# Patient Record
Sex: Male | Born: 1940 | Race: Black or African American | Hispanic: No | Smoking: Never smoker
Health system: Southern US, Community
[De-identification: ages and names within clinical notes are randomized; demographics above are authoritative.]

## PROBLEM LIST (undated history)

## (undated) DIAGNOSIS — E119 Type 2 diabetes mellitus without complications: Secondary | ICD-10-CM

## (undated) DIAGNOSIS — I4891 Unspecified atrial fibrillation: Secondary | ICD-10-CM

## (undated) DIAGNOSIS — I10 Essential (primary) hypertension: Secondary | ICD-10-CM

## (undated) DIAGNOSIS — I219 Acute myocardial infarction, unspecified: Secondary | ICD-10-CM

## (undated) HISTORY — PX: HERNIA REPAIR: SHX51

---

## 2009-04-14 ENCOUNTER — Emergency Department (HOSPITAL_COMMUNITY): Admission: EM | Admit: 2009-04-14 | Discharge: 2009-04-14 | Payer: Self-pay | Admitting: Emergency Medicine

## 2010-08-29 IMAGING — CR DG CHEST 2V
2 series · 2 of 2 positions shown · non-contrast
Comparison: None.

CLINICAL DATA: Weakness.  Cough.  Headache.

CHEST - 2 VIEW 04/14/2009:

[w chest pa]
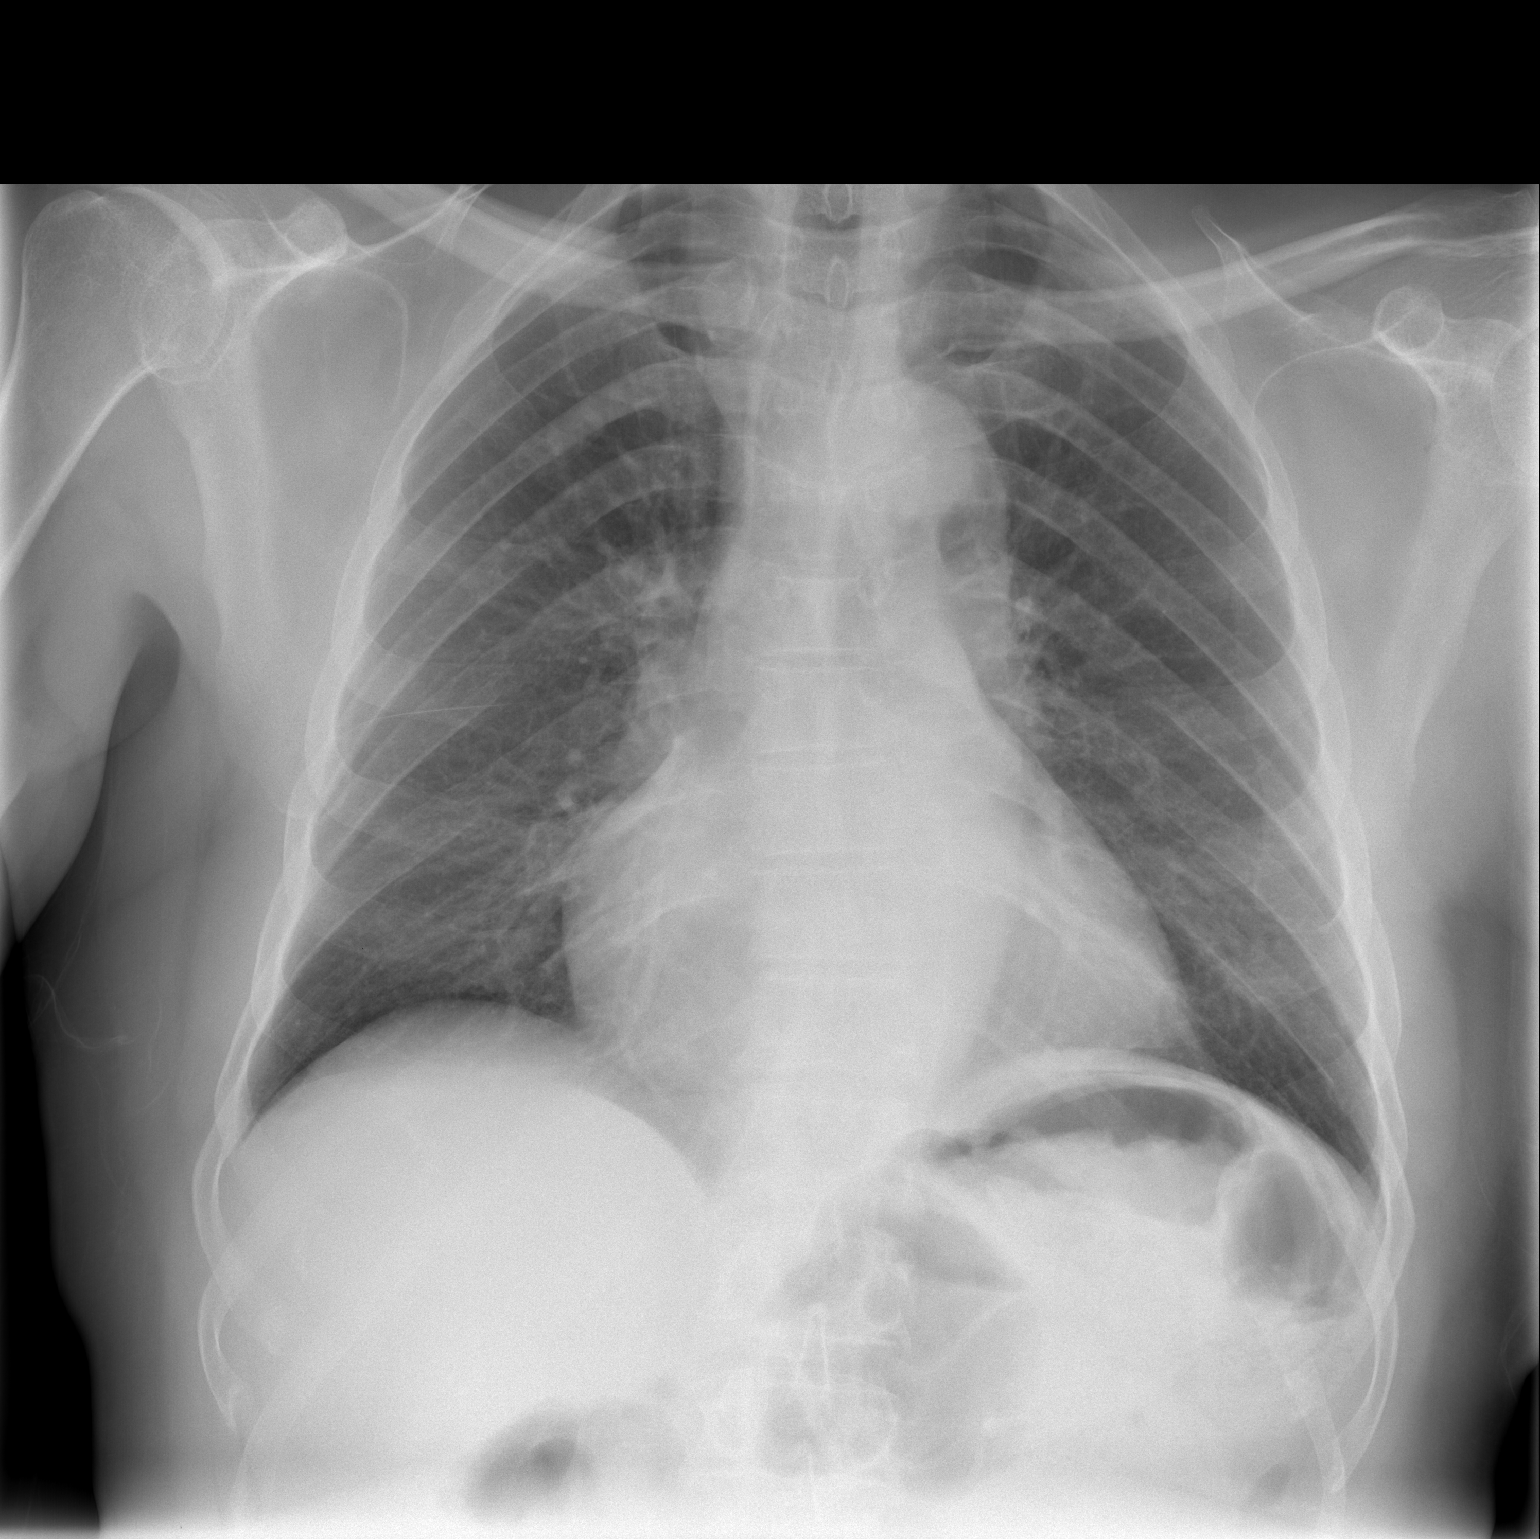

[w chest lat]
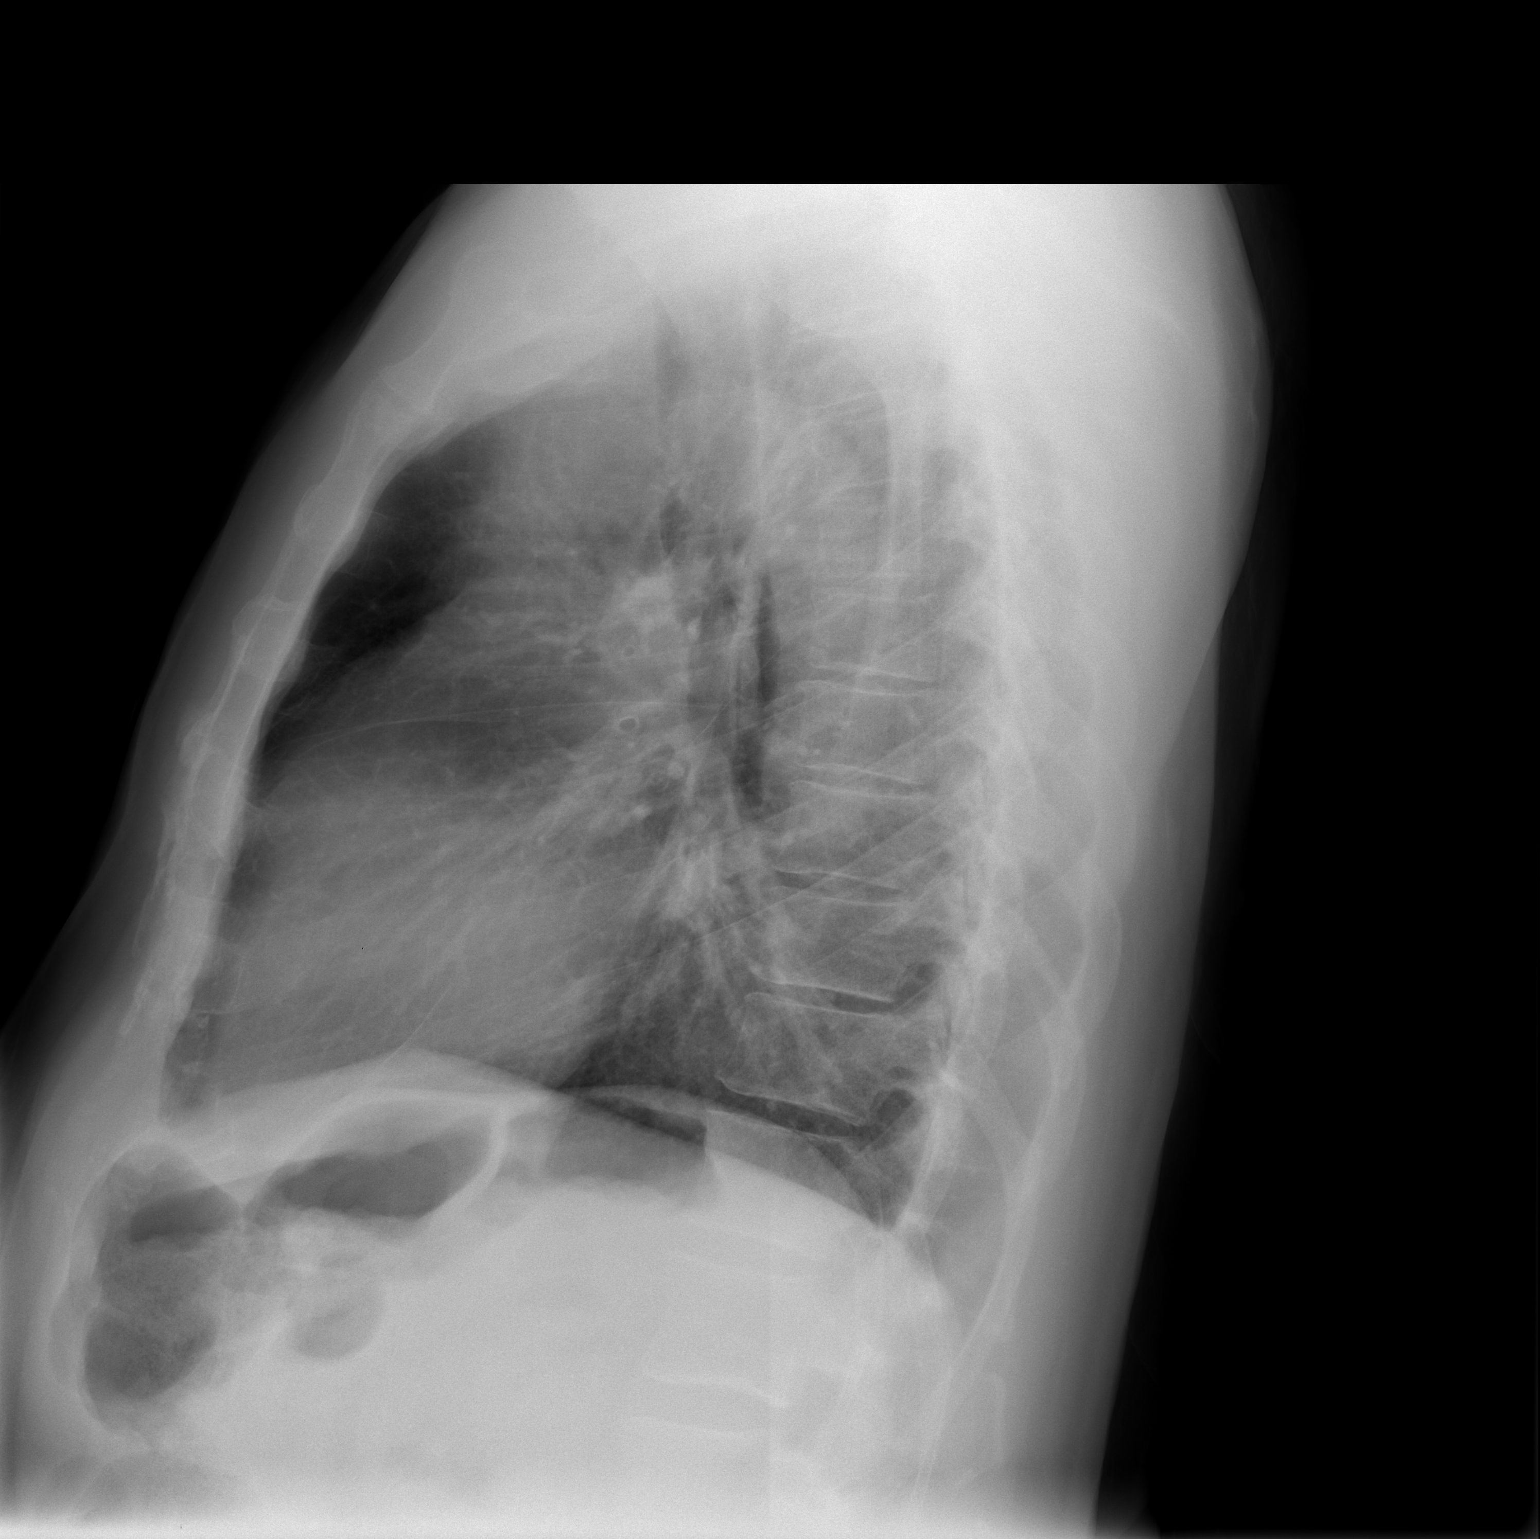

[2 of 2 positions shown; findings below may reference images not displayed]

FINDINGS: Heart mildly enlarged.  Thoracic aorta tortuous.  Hilar
and mediastinal contours otherwise unremarkable.  Lungs clear.
Bronchovascular markings normal.  No pleural effusions.  Visualized
bony thorax intact.
IMPRESSION: Mild cardiomegaly.  No acute cardiopulmonary disease.

## 2018-11-27 DEATH — deceased

## 2019-07-17 ENCOUNTER — Encounter (HOSPITAL_COMMUNITY): Payer: Self-pay | Admitting: Emergency Medicine

## 2019-07-17 ENCOUNTER — Emergency Department (HOSPITAL_COMMUNITY)
Admission: EM | Admit: 2019-07-17 | Discharge: 2019-07-17 | Disposition: A | Discharge status: Home / Self Care | Payer: Medicare Other | Attending: Emergency Medicine | Admitting: Emergency Medicine

## 2019-07-17 ENCOUNTER — Other Ambulatory Visit: Payer: Self-pay

## 2019-07-17 DIAGNOSIS — Z79899 Other long term (current) drug therapy: Secondary | ICD-10-CM | POA: Diagnosis not present

## 2019-07-17 DIAGNOSIS — L03116 Cellulitis of left lower limb: Secondary | ICD-10-CM | POA: Insufficient documentation

## 2019-07-17 DIAGNOSIS — R2243 Localized swelling, mass and lump, lower limb, bilateral: Secondary | ICD-10-CM | POA: Diagnosis present

## 2019-07-17 DIAGNOSIS — L03115 Cellulitis of right lower limb: Secondary | ICD-10-CM | POA: Diagnosis not present

## 2019-07-17 DIAGNOSIS — R600 Localized edema: Secondary | ICD-10-CM

## 2019-07-17 LAB — CBC
HCT: 38.4 % — ABNORMAL LOW (ref 39.0–52.0)
Hemoglobin: 12.3 g/dL — ABNORMAL LOW (ref 13.0–17.0)
MCH: 28.5 pg (ref 26.0–34.0)
MCHC: 32 g/dL (ref 30.0–36.0)
MCV: 89.1 fL (ref 80.0–100.0)
Platelets: 151 10*3/uL (ref 150–400)
RBC: 4.31 MIL/uL (ref 4.22–5.81)
RDW: 14.5 % (ref 11.5–15.5)
WBC: 5.9 10*3/uL (ref 4.0–10.5)
nRBC: 0 % (ref 0.0–0.2)

## 2019-07-17 LAB — BASIC METABOLIC PANEL
Anion gap: 10 (ref 5–15)
BUN: 13 mg/dL (ref 8–23)
CO2: 28 mmol/L (ref 22–32)
Calcium: 9 mg/dL (ref 8.9–10.3)
Chloride: 98 mmol/L (ref 98–111)
Creatinine, Ser: 0.97 mg/dL (ref 0.61–1.24)
GFR calc Af Amer: >60 mL/min (ref >60)
GFR calc non Af Amer: >60 mL/min (ref >60)
Glucose, Bld: 135 mg/dL — ABNORMAL HIGH (ref 70–99)
Potassium: 3.2 mmol/L — ABNORMAL LOW (ref 3.5–5.1)
Sodium: 136 mmol/L (ref 135–145)

## 2019-07-17 MED ORDER — DOXYCYCLINE HYCLATE 100 MG PO TABS
100.0000 mg | ORAL_TABLET | Freq: Once | ORAL | Status: AC
  Administered 2019-07-17: 100 mg via ORAL
  Filled 2019-07-17: qty 1

## 2019-07-17 MED ORDER — POTASSIUM CHLORIDE CRYS ER 20 MEQ PO TBCR
40.0000 meq | EXTENDED_RELEASE_TABLET | Freq: Once | ORAL | Status: AC
  Administered 2019-07-17: 40 meq via ORAL
  Filled 2019-07-17: qty 2

## 2019-07-17 MED ORDER — LOSARTAN POTASSIUM 50 MG PO TABS
25.0000 mg | ORAL_TABLET | Freq: Once | ORAL | Status: AC
Start: 2019-07-17 — End: 2019-07-17
  Administered 2019-07-17: 25 mg via ORAL
  Filled 2019-07-17: qty 1

## 2019-07-17 NOTE — Discharge Instructions (Addendum)
You were seen today for your bilateral leg swelling, there is most likely overlying infection.  I want you follow-up to follow the instructions that [facility] at home instructed.  Your blood pressure was elevated in the emergency department today as we spoke about.  This is probably because you did not take your blood pressure medications.  I want you to take your blood pressure medications regularly and follow-up with your primary care doctor about this if they continue to be elevated.  Please come back to the emergency department if you have any chest pain, shortness of breath, weakness, dizziness.

## 2019-07-17 NOTE — ED Provider Notes (Addendum)
[facility] EMERGENCY DEPARTMENT Provider Note   CSN: [id number] Arrival date & time: 07/17/19  2751     History Chief Complaint  Patient presents with  . Leg Swelling    Mason Hill is a 79 y.o. male with pertinent past medical his hypertension that presents to the emergency department today for bilateral leg swelling for the past week.  Patient states that he normally has mild leg swelling with chronic leg crusting and symptoms of stasis dermatitis.  States that for the past week he has noticed that his legs have been weeping and crusting more.  Also states that he thought he had a bug bite in his arm, scratched his arm after scratching his leg and started having weeping lesions in his right arm.  No other lesions noted.  Denies any fevers, chills, chest pain, shortness of breath, dizziness, weakness, paresthesias, gait abnormality, alcohol abuse.  Patient states that he has been walking normally.  States that this happened to him once before 10 years ago, resolved with antibiotics.  States that he forgot to take his blood pressure medication, losartan this morning.   HPI     History reviewed. No pertinent past medical history.        There are no problems to display for this patient.   History reviewed. No pertinent surgical history.     No family history on file.  Social History   Tobacco Use  . Smoking status: Not on file  Substance Use Topics  . Alcohol use: Not on file  . Drug use: Not on file    Home Medications Prior to Admission medications   Medication Sig Start Date End Date Taking? Authorizing Provider  amLODipine (NORVASC) 10 MG tablet Take 10 mg by mouth daily. 11/22/18  Yes [provider]  losartan (COZAAR) 25 MG tablet Take 25 mg by mouth daily.   Yes [provider]    Allergies    Codeine, Erythromycin, and Isosorbide nitrate  Review of Systems   Review of Systems  Constitutional: Negative for chills,  diaphoresis, fatigue and fever.  HENT: Negative for congestion, sore throat and trouble swallowing.   Eyes: Negative for pain and visual disturbance.  Respiratory: Negative for cough, shortness of breath and wheezing.   Cardiovascular: Positive for leg swelling. Negative for chest pain and palpitations.  Gastrointestinal: Negative for abdominal distention, abdominal pain, diarrhea, nausea and vomiting.  Genitourinary: Negative for difficulty urinating.  Musculoskeletal: Negative for back pain, neck pain and neck stiffness.  Skin: Positive for rash. Negative for pallor.  Neurological: Negative for dizziness, speech difficulty, weakness and headaches.  Psychiatric/Behavioral: Negative for confusion.    Physical Exam Updated Vital Signs BP (!) 176/102   Pulse 62   Temp 97.7 F (36.5 C) (Oral)   Resp 18   Ht 6' (1.829 m)   Wt 91.2 kg   SpO2 100%   BMI 27.26 kg/m   Physical Exam Constitutional:      General: He is not in acute distress.    Appearance: Normal appearance. He is not ill-appearing, toxic-appearing or diaphoretic.  HENT:     Mouth/Throat:     Mouth: Mucous membranes are moist.     Pharynx: Oropharynx is clear.  Eyes:     General: No scleral icterus.    Extraocular Movements: Extraocular movements intact.     Pupils: Pupils are equal, round, and reactive to light.  Cardiovascular:     Rate and Rhythm: Normal rate and regular rhythm.  Pulses: Normal pulses.     Heart sounds: Normal heart sounds.  Pulmonary:     Effort: Pulmonary effort is normal. No respiratory distress.     Breath sounds: Normal breath sounds. No stridor. No wheezing, rhonchi or rales.  Chest:     Chest wall: No tenderness.  Abdominal:     General: Abdomen is flat. There is no distension.     Palpations: Abdomen is soft.     Tenderness: There is no abdominal tenderness. There is no guarding or rebound.  Musculoskeletal:        General: No swelling or tenderness. Normal range of motion.      Cervical back: Normal range of motion and neck supple. No rigidity.     Right lower leg: Edema present.     Left lower leg: Edema present.     Comments: Bilateral legs with erythema, scaling, lichenification with weeping erosions and crusting.  Honey color cursted lesions throughout. Erythema extending up until shins.  1+ pitting edema from foot to mid calf.  Nontender to touch.  Is able to move foot, ankle, leg in all directions without pain.  Normal sensation throughout. Overlying warmth.  See picture above.  Distal pulses 2+.  Skin:    General: Skin is warm and dry.     Capillary Refill: Capillary refill takes less than 2 seconds.     Coloration: Skin is not pale.     Findings: Lesion (Right arm with weeping lesion to medial bicep, satellite erythematous papules surrounding.  About 4 x 3 cm.  No purulent drainage, clear drainage see picture above.) present.  Neurological:     General: No focal deficit present.     Mental Status: He is alert and oriented to person, place, and time. Mental status is at baseline.     Cranial Nerves: No cranial nerve deficit.     Sensory: No sensory deficit.     Motor: No weakness.     Coordination: Coordination normal.  Psychiatric:        Mood and Affect: Mood normal.        Behavior: Behavior normal.           ED Results / Procedures / Treatments   Labs (all labs ordered are listed, but only abnormal results are displayed) Labs Reviewed  BASIC METABOLIC PANEL - Abnormal; Notable for the following components:      Result Value   Potassium 3.2 (*)    Glucose, Bld 135 (*)    All other components within normal limits  CBC - Abnormal; Notable for the following components:   Hemoglobin 12.3 (*)    HCT 38.4 (*)    All other components within normal limits    EKG None  Radiology No results found.  Procedures Procedures (including critical care time)  Medications Ordered in ED Medications  losartan (COZAAR) tablet 25 mg (25 mg Oral  Given 07/17/19 1053)  potassium chloride SA (KLOR-CON) CR tablet 40 mEq (40 mEq Oral Given 07/17/19 1053)  doxycycline (VIBRA-TABS) tablet 100 mg (100 mg Oral Given 07/17/19 1346)    ED Course  I have reviewed the triage vital signs and the nursing notes.  Pertinent labs & imaging results that were available during my care of the patient were reviewed by me and considered in my medical decision making (see chart for details).    MDM Rules/Calculators/A&P                     Milbert Coulter  Jasinski is a 79 y.o. male with pertinent past medical his hypertension that presents to the emergency department today for bilateral leg swelling for the past week.  Patient is not currently complaining of pain, normal strength and sensation.  Distal pulses intact.  Physical exam looks like stasis dermatitis with overlying infection looking like impetigo with some honey crusted lesions.  We will treat for this, and speak to [facility] hospital at home who I think will be a good candidate for this due to patient not seeing a doctor for multiple years and need for wound care and antibiotics daily.  Spoke to Dr. Caron Presume with [facility] at home who will assess patient.  Dr. Caron Presume states that patient is a candidate for program.  See his note.  Will initiate 1 dose of doxycycline here and patient will have follow-up at home with wound care and antibiotic's.  Labs with stable CBC and CMP.  Potassium 3.2, will replenish in the emergency department.  Patient did not take losartan this morning, will give here today.  Patient does not know how much he takes, will give 25mg .  Patient states that he also takes something else but is unsure.  Patient's blood pressure is elevated today, most likely due to noncompliance with blood pressure medication today.  Expressed the importance of taking blood pressure medication and he states that he will when he gets home. Patient denies headache, change in vision, numbness, weakness, chest pain, dyspnea,  dizziness, or lightheadedness therefore doubt hypertensive emergency. Discussed return precaution signs/symptoms for hypertensive emergency as listed above with the patient. He/she confirmed understanding.    Doubt need for further emergent work up at this time. I explained the diagnosis and have given explicit precautions to return to the ER including for any other new or worsening symptoms. The patient understands and accepts the medical plan as it's been dictated and I have answered their questions. Discharge instructions concerning home care and prescriptions have been given. The patient is STABLE and is discharged to home in good condition.   I discussed this case with my attending physician, Dr. , who cosigned this note including patient's presenting symptoms, physical exam, and planned diagnostics and interventions. Attending physician stated agreement with plan or made changes to plan which were implemented. He signed EKG and is aware, did not suspect signs of ischemia. Will have pt follow up with PCP, discussed this with pt.  Attending physician assessed patient at bedside.   SEE [facility] note - Dr. [facility] for final disposition.    Final Clinical Impression(s) / ED Diagnoses Final diagnoses:  Bilateral lower extremity edema  Bilateral lower leg cellulitis    Rx / DC Orders ED Discharge Orders    None       Caron Presume, PA-C 07/17/19 1737    07/19/19, PA-C 07/17/19 1739    07/19/19, PA-C 07/17/19 1741    07/19/19, MD 08/18/19 2050

## 2019-07-17 NOTE — ED Provider Notes (Signed)
Medical screening examination/treatment/procedure(s) were conducted as a shared visit with non-physician practitioner(s) and myself.  I personally evaluated the patient during the encounter.    Patient seen by me along with physician assistant.  Patient with chronic edema to both lower extremities but there is a lot of intense erythema which can be chronic but there is also a fair amount of increased warmth and there is sort of a honeycomb discharge.  He also has something similar on his left arm area as well.  Suggestive of staph or strep infection.  Feel that he would benefit from antibiotics.  May be a candidate for that [facility] hospital at home program.  We will have the provider review to see if he is a candidate.      Vanetta Mulders, MD 07/17/19 1249

## 2019-07-17 NOTE — ED Notes (Signed)
Remote specialist representative at bedside.

## 2019-07-17 NOTE — ED Triage Notes (Signed)
Pt reports one week ago he began to have itching and pain in right leg- a couple of days ago it began to turn red from calf down and weeping. Pt also has open area on right upper arm that he states was a tick bite.

## 2019-07-17 NOTE — Plan of Care (Signed)
[facility]  Consult Note  Chief Complaint: RLE pain  History of Present Illness: Mason Hill is a 79 year old male with hypertension, PAF, and diet controlled diabetes who presented to the emergency department with three days of right lower extremity pain. He states that he normally has swelling in both legs and chronic skin changes; however over the last three days he is noticed blistering, crusting, and weeping. He has had this happened in the past and is led to an infection. He denies systemic signs of infection. He does noticed increased pain and swelling in the right lower extremity. He also has a lesion on his right bicep that erupted approximately two days ago. He states that he thinks he got bit by a bug. Since that time he has been itching it in his become excoriated.  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 He has since moved to [place]. He got married in September. He lives at home with his wife. They have no animals. He has access to all utilities. He has access to all his medications.  Meds:  Current Meds  Medication Sig  . amLODipine (NORVASC) 10 MG tablet Take 10 mg by mouth daily.  Marland Kitchen losartan (COZAAR) 25 MG tablet Take 25 mg by mouth daily.  . [DISCONTINUED] apixaban (ELIQUIS) 5 MG TABS tablet Take 5 mg by mouth in the morning and at bedtime.  . [DISCONTINUED] chlorthalidone (HYGROTON) 25 MG tablet Take 25 mg by mouth daily.   Physical Exam: Blood pressure (!) 176/109, pulse 62, temperature 97.9 F (36.6 C), temperature source Oral, resp. rate 16, height 6' (1.829 m), weight 91.2 kg, SpO2 100 %.  General: Well nourished male in no acute distress HENT: Normocephalic, atraumatic, moist mucus membranes Pulm: Good air movement with no wheezing or crackles  CV: RRR, no murmurs, no rubs  Abdomen: Active bowel sounds, soft, non-distended, no tenderness to palpation  Extremities: Bilateral LE edema with lichenification. Blisters, crusting, and clear  discharge on the RLE. Lesion noted on the anterior RUE with crusting and clear discharge  Skin: Warm and dry  Neuro: Alert and oriented x 3  Clinical Screening: (ALL ANSWERS MUST BE NO)  Based on current presentation is the patient likely to require: advanced diagnostics, advanced imaging (CT, MRI, nuclear stress testing), cardiac catheterization, EGD/colonoscopy, or lab monitoring not amendable to home monitoring (troponin, >q12 hour labs): NO.  Based on current presentation is the patient is likely to require: mechanical ventilation (invasive and noninvasive, history of intubation) and/or vasoactive medications: NO.  Based on current presentation is the patient likely to require a surgical or IR procedure including but not limited to intraabdominal abscess drainage, percutaneous nephrostomy tube placement, thoracentesis for parapneumonic effusion, surgical wound debridement: NO.  Based on current presentation is the patient is likely to require: daily specialty consultation, blood transfusions, respiratory isolation/airborne precautions, active adjustments of opiates or IV pain medications: NO.  Does the patient have barriers that would make it unsafe to provide care in the home including but not limited to severe AMS, active substance use disorder, history of or high risk of noncompliance with primary treatment plan: NO.  Has the patient ever been intubated or do they have a new tracheostomy: NO.  Does the patient have an unstable arrhythmia: NO.  Is hemodialysis likely to be required (i.e. already on HD or newly anuric/sever ATN): NO.  Is there risk for inability to obtain IV access: NO.]  Social Screening: (ALL QUESTIONS MUST BE YES) Does  the patient have a home recovery environment? YES.  Is the patient's home recovery environment in an eligible geography [facility])? YES.  Does the patient's home have water, electricity, bathroom, heat/ac, refrigerator? YES.  Does the  patient feel safe at home? YES.  Are family/caregivers willing to participate, as needed, while the patient participates [facility].  Is there a person in the home (patient or other) willing/able to take vital signs and answer phone calls? YES.  Is the patient willing to put pets in a secure area while [facility] and affiliated staff are in the home? YES.  Is patient willing/able to participate in the [facility] (this includes [facility] affiliated staff entering the home, and associated services)? YES.  Is the patient/patient's HCP willing/able to sign consent? YES.  Assessment / Plan:  Based on the HPI and information obtained the patient is a candidate for the [facility] at [facility]. Consent has been signed and the patient has been provided with a copy.   The patient has been enrolled in the [facility] program for treatment of Non-purulent Cellulitis. [facility] has been notified and will present to the patient's house this evening, 07/17/19.   Plan: - Recommend treatment for non-purulent cellulitis by protocol. No renal dysfunction.  - Would recommend diuresing patient with furosemide  - Once wounds have healed lifestyle modification for stasis dermatitis including elevation of legs and wraps.   Home health / DME needs: None  Patient's contact information:  Phone: [phone] Address: [place] [address]  Please do not hesitate to call with questions/concerns.   Ina Homes, MD 07/17/2019, 12:43 PM  Cell [phone]

## 2022-10-25 ENCOUNTER — Encounter (HOSPITAL_COMMUNITY): Payer: Self-pay

## 2022-10-25 ENCOUNTER — Emergency Department (HOSPITAL_COMMUNITY): Payer: Medicare Other

## 2022-10-25 ENCOUNTER — Inpatient Hospital Stay (HOSPITAL_COMMUNITY)
Admission: EM | Admit: 2022-10-25 | Discharge: 2022-11-03 | DRG: 871 | Disposition: A | Discharge status: Home Health | Payer: Medicare Other | Attending: Internal Medicine | Admitting: Internal Medicine

## 2022-10-25 ENCOUNTER — Other Ambulatory Visit: Payer: Self-pay

## 2022-10-25 DIAGNOSIS — A419 Sepsis, unspecified organism: Secondary | ICD-10-CM | POA: Diagnosis not present

## 2022-10-25 DIAGNOSIS — E872 Acidosis, unspecified: Secondary | ICD-10-CM | POA: Diagnosis present

## 2022-10-25 DIAGNOSIS — N1831 Chronic kidney disease, stage 3a: Secondary | ICD-10-CM | POA: Diagnosis present

## 2022-10-25 DIAGNOSIS — Z7982 Long term (current) use of aspirin: Secondary | ICD-10-CM

## 2022-10-25 DIAGNOSIS — Z8679 Personal history of other diseases of the circulatory system: Secondary | ICD-10-CM | POA: Diagnosis not present

## 2022-10-25 DIAGNOSIS — E1122 Type 2 diabetes mellitus with diabetic chronic kidney disease: Secondary | ICD-10-CM | POA: Diagnosis present

## 2022-10-25 DIAGNOSIS — I10 Essential (primary) hypertension: Secondary | ICD-10-CM | POA: Diagnosis not present

## 2022-10-25 DIAGNOSIS — I130 Hypertensive heart and chronic kidney disease with heart failure and stage 1 through stage 4 chronic kidney disease, or unspecified chronic kidney disease: Secondary | ICD-10-CM | POA: Diagnosis present

## 2022-10-25 DIAGNOSIS — I252 Old myocardial infarction: Secondary | ICD-10-CM

## 2022-10-25 DIAGNOSIS — B951 Streptococcus, group B, as the cause of diseases classified elsewhere: Secondary | ICD-10-CM | POA: Diagnosis present

## 2022-10-25 DIAGNOSIS — I214 Non-ST elevation (NSTEMI) myocardial infarction: Secondary | ICD-10-CM | POA: Diagnosis present

## 2022-10-25 DIAGNOSIS — B9689 Other specified bacterial agents as the cause of diseases classified elsewhere: Secondary | ICD-10-CM | POA: Diagnosis present

## 2022-10-25 DIAGNOSIS — E440 Moderate protein-calorie malnutrition: Secondary | ICD-10-CM | POA: Diagnosis present

## 2022-10-25 DIAGNOSIS — R7989 Other specified abnormal findings of blood chemistry: Secondary | ICD-10-CM | POA: Diagnosis present

## 2022-10-25 DIAGNOSIS — A401 Sepsis due to streptococcus, group B: Principal | ICD-10-CM | POA: Diagnosis present

## 2022-10-25 DIAGNOSIS — J189 Pneumonia, unspecified organism: Secondary | ICD-10-CM | POA: Diagnosis present

## 2022-10-25 DIAGNOSIS — Z888 Allergy status to other drugs, medicaments and biological substances status: Secondary | ICD-10-CM

## 2022-10-25 DIAGNOSIS — R5381 Other malaise: Secondary | ICD-10-CM | POA: Diagnosis present

## 2022-10-25 DIAGNOSIS — I3139 Other pericardial effusion (noninflammatory): Secondary | ICD-10-CM | POA: Diagnosis present

## 2022-10-25 DIAGNOSIS — I4821 Permanent atrial fibrillation: Secondary | ICD-10-CM | POA: Diagnosis present

## 2022-10-25 DIAGNOSIS — N5089 Other specified disorders of the male genital organs: Secondary | ICD-10-CM | POA: Diagnosis present

## 2022-10-25 DIAGNOSIS — I5043 Acute on chronic combined systolic (congestive) and diastolic (congestive) heart failure: Secondary | ICD-10-CM | POA: Diagnosis present

## 2022-10-25 DIAGNOSIS — Z79899 Other long term (current) drug therapy: Secondary | ICD-10-CM

## 2022-10-25 DIAGNOSIS — E876 Hypokalemia: Secondary | ICD-10-CM | POA: Diagnosis not present

## 2022-10-25 DIAGNOSIS — R6520 Severe sepsis without septic shock: Secondary | ICD-10-CM | POA: Diagnosis present

## 2022-10-25 DIAGNOSIS — Z6829 Body mass index (BMI) 29.0-29.9, adult: Secondary | ICD-10-CM

## 2022-10-25 DIAGNOSIS — E1151 Type 2 diabetes mellitus with diabetic peripheral angiopathy without gangrene: Secondary | ICD-10-CM | POA: Diagnosis present

## 2022-10-25 DIAGNOSIS — R601 Generalized edema: Secondary | ICD-10-CM | POA: Diagnosis not present

## 2022-10-25 DIAGNOSIS — E785 Hyperlipidemia, unspecified: Secondary | ICD-10-CM | POA: Diagnosis present

## 2022-10-25 DIAGNOSIS — I058 Other rheumatic mitral valve diseases: Secondary | ICD-10-CM | POA: Diagnosis not present

## 2022-10-25 DIAGNOSIS — I059 Rheumatic mitral valve disease, unspecified: Secondary | ICD-10-CM

## 2022-10-25 DIAGNOSIS — E1129 Type 2 diabetes mellitus with other diabetic kidney complication: Secondary | ICD-10-CM | POA: Diagnosis present

## 2022-10-25 DIAGNOSIS — Z23 Encounter for immunization: Secondary | ICD-10-CM

## 2022-10-25 DIAGNOSIS — I2489 Other forms of acute ischemic heart disease: Secondary | ICD-10-CM | POA: Diagnosis present

## 2022-10-25 DIAGNOSIS — I330 Acute and subacute infective endocarditis: Secondary | ICD-10-CM | POA: Diagnosis present

## 2022-10-25 DIAGNOSIS — I5022 Chronic systolic (congestive) heart failure: Secondary | ICD-10-CM | POA: Diagnosis not present

## 2022-10-25 DIAGNOSIS — E8809 Other disorders of plasma-protein metabolism, not elsewhere classified: Secondary | ICD-10-CM | POA: Diagnosis present

## 2022-10-25 DIAGNOSIS — Z881 Allergy status to other antibiotic agents status: Secondary | ICD-10-CM

## 2022-10-25 DIAGNOSIS — I2510 Atherosclerotic heart disease of native coronary artery without angina pectoris: Secondary | ICD-10-CM | POA: Diagnosis present

## 2022-10-25 DIAGNOSIS — I361 Nonrheumatic tricuspid (valve) insufficiency: Secondary | ICD-10-CM | POA: Diagnosis not present

## 2022-10-25 DIAGNOSIS — B954 Other streptococcus as the cause of diseases classified elsewhere: Secondary | ICD-10-CM | POA: Diagnosis present

## 2022-10-25 DIAGNOSIS — J9811 Atelectasis: Secondary | ICD-10-CM | POA: Diagnosis present

## 2022-10-25 DIAGNOSIS — I480 Paroxysmal atrial fibrillation: Secondary | ICD-10-CM | POA: Diagnosis not present

## 2022-10-25 DIAGNOSIS — I700 Atherosclerosis of aorta: Secondary | ICD-10-CM | POA: Diagnosis present

## 2022-10-25 DIAGNOSIS — I5031 Acute diastolic (congestive) heart failure: Secondary | ICD-10-CM | POA: Diagnosis not present

## 2022-10-25 DIAGNOSIS — R079 Chest pain, unspecified: Secondary | ICD-10-CM | POA: Diagnosis not present

## 2022-10-25 DIAGNOSIS — Z7901 Long term (current) use of anticoagulants: Secondary | ICD-10-CM

## 2022-10-25 DIAGNOSIS — Z1152 Encounter for screening for COVID-19: Secondary | ICD-10-CM | POA: Diagnosis not present

## 2022-10-25 DIAGNOSIS — I351 Nonrheumatic aortic (valve) insufficiency: Secondary | ICD-10-CM | POA: Diagnosis not present

## 2022-10-25 DIAGNOSIS — I340 Nonrheumatic mitral (valve) insufficiency: Secondary | ICD-10-CM | POA: Diagnosis not present

## 2022-10-25 DIAGNOSIS — Z7984 Long term (current) use of oral hypoglycemic drugs: Secondary | ICD-10-CM

## 2022-10-25 DIAGNOSIS — I3131 Malignant pericardial effusion in diseases classified elsewhere: Secondary | ICD-10-CM | POA: Diagnosis not present

## 2022-10-25 DIAGNOSIS — Z885 Allergy status to narcotic agent status: Secondary | ICD-10-CM

## 2022-10-25 DIAGNOSIS — N183 Chronic kidney disease, stage 3 unspecified: Secondary | ICD-10-CM | POA: Diagnosis present

## 2022-10-25 HISTORY — DX: Essential (primary) hypertension: I10

## 2022-10-25 HISTORY — DX: Unspecified atrial fibrillation: I48.91

## 2022-10-25 HISTORY — DX: Acute myocardial infarction, unspecified: I21.9

## 2022-10-25 HISTORY — DX: Type 2 diabetes mellitus without complications: E11.9

## 2022-10-25 LAB — COMPREHENSIVE METABOLIC PANEL
ALT: 13 U/L (ref 0–44)
AST: 25 U/L (ref 15–41)
Albumin: 1.9 g/dL — ABNORMAL LOW (ref 3.5–5.0)
Alkaline Phosphatase: 46 U/L (ref 38–126)
Anion gap: 8 (ref 5–15)
BUN: 18 mg/dL (ref 8–23)
CO2: 27 mmol/L (ref 22–32)
Calcium: 7.7 mg/dL — ABNORMAL LOW (ref 8.9–10.3)
Chloride: 106 mmol/L (ref 98–111)
Creatinine, Ser: 1.16 mg/dL (ref 0.61–1.24)
GFR, Estimated: >60 mL/min (ref >60)
Glucose, Bld: 105 mg/dL — ABNORMAL HIGH (ref 70–99)
Potassium: 3.3 mmol/L — ABNORMAL LOW (ref 3.5–5.1)
Sodium: 141 mmol/L (ref 135–145)
Total Bilirubin: 1.4 mg/dL — ABNORMAL HIGH (ref 0.3–1.2)
Total Protein: 4.3 g/dL — ABNORMAL LOW (ref 6.5–8.1)

## 2022-10-25 LAB — CBC WITH DIFFERENTIAL/PLATELET
Abs Immature Granulocytes: 0.19 10*3/uL — ABNORMAL HIGH (ref 0.00–0.07)
Basophils Absolute: 0.1 10*3/uL (ref 0.0–0.1)
Basophils Relative: 0 %
Eosinophils Absolute: 0 10*3/uL (ref 0.0–0.5)
Eosinophils Relative: 0 %
HCT: 35 % — ABNORMAL LOW (ref 39.0–52.0)
Hemoglobin: 11.2 g/dL — ABNORMAL LOW (ref 13.0–17.0)
Immature Granulocytes: 1 %
Lymphocytes Relative: 2 %
Lymphs Abs: 0.3 10*3/uL — ABNORMAL LOW (ref 0.7–4.0)
MCH: 30.4 pg (ref 26.0–34.0)
MCHC: 32 g/dL (ref 30.0–36.0)
MCV: 94.9 fL (ref 80.0–100.0)
Monocytes Absolute: 1.6 10*3/uL — ABNORMAL HIGH (ref 0.1–1.0)
Monocytes Relative: 10 %
Neutro Abs: 14.3 10*3/uL — ABNORMAL HIGH (ref 1.7–7.7)
Neutrophils Relative %: 87 %
Platelets: 174 10*3/uL (ref 150–400)
RBC: 3.69 MIL/uL — ABNORMAL LOW (ref 4.22–5.81)
RDW: 13.7 % (ref 11.5–15.5)
WBC: 16.4 10*3/uL — ABNORMAL HIGH (ref 4.0–10.5)
nRBC: 0 % (ref 0.0–0.2)

## 2022-10-25 LAB — URINALYSIS, W/ REFLEX TO CULTURE (INFECTION SUSPECTED)
Bacteria, UA: NONE SEEN
Bilirubin Urine: NEGATIVE
Glucose, UA: NEGATIVE mg/dL
Hgb urine dipstick: NEGATIVE
Ketones, ur: NEGATIVE mg/dL
Leukocytes,Ua: NEGATIVE
Nitrite: NEGATIVE
Protein, ur: NEGATIVE mg/dL
Specific Gravity, Urine: 1.02 (ref 1.005–1.030)
pH: 5 (ref 5.0–8.0)

## 2022-10-25 LAB — I-STAT CG4 LACTIC ACID, ED
Lactic Acid, Venous: 2.5 mmol/L (ref 0.5–1.9)
Lactic Acid, Venous: 3.5 mmol/L (ref 0.5–1.9)
Lactic Acid, Venous: 3.1 mmol/L (ref 0.5–1.9)

## 2022-10-25 LAB — TROPONIN I (HIGH SENSITIVITY)
Troponin I (High Sensitivity): 252 ng/L (ref <18)
Troponin I (High Sensitivity): 256 ng/L (ref <18)

## 2022-10-25 LAB — SARS CORONAVIRUS 2 BY RT PCR: SARS Coronavirus 2 by RT PCR: NEGATIVE

## 2022-10-25 LAB — PROTIME-INR
INR: 1.2 (ref 0.8–1.2)
Prothrombin Time: 15.7 seconds — ABNORMAL HIGH (ref 11.4–15.2)

## 2022-10-25 LAB — BRAIN NATRIURETIC PEPTIDE: B Natriuretic Peptide: 684.8 pg/mL — ABNORMAL HIGH (ref 0.0–100.0)

## 2022-10-25 LAB — CBG MONITORING, ED: Glucose-Capillary: 108 mg/dL — ABNORMAL HIGH (ref 70–99)

## 2022-10-25 MED ORDER — IOHEXOL 350 MG/ML SOLN
75.0000 mL | Freq: Once | INTRAVENOUS | Status: AC | PRN
  Administered 2022-10-25: 75 mL via INTRAVENOUS

## 2022-10-25 MED ORDER — SODIUM CHLORIDE 0.9 % IV SOLN
2.0000 g | Freq: Once | INTRAVENOUS | Status: AC
  Administered 2022-10-25: 2 g via INTRAVENOUS
  Filled 2022-10-25: qty 20

## 2022-10-25 MED ORDER — SODIUM CHLORIDE 0.9% FLUSH
3.0000 mL | INTRAVENOUS | Status: DC | PRN
  Administered 2022-10-26: 3 mL via INTRAVENOUS

## 2022-10-25 MED ORDER — SODIUM CHLORIDE 0.9 % IV SOLN: 1.0000 g | INTRAVENOUS | Status: DC

## 2022-10-25 MED ORDER — LACTATED RINGERS IV SOLN: INTRAVENOUS | Status: DC

## 2022-10-25 MED ORDER — SODIUM CHLORIDE 0.9 % IV SOLN: 250.0000 mL | INTRAVENOUS | Status: DC | PRN

## 2022-10-25 MED ORDER — SODIUM CHLORIDE 0.9% FLUSH
3.0000 mL | Freq: Two times a day (BID) | INTRAVENOUS | Status: DC
  Administered 2022-10-26 – 2022-11-03 (×14): 3 mL via INTRAVENOUS

## 2022-10-25 MED ORDER — ATORVASTATIN CALCIUM 40 MG PO TABS
40.0000 mg | ORAL_TABLET | Freq: Every day | ORAL | Status: DC
  Administered 2022-10-25 – 2022-11-03 (×10): 40 mg via ORAL
  Filled 2022-10-25 (×10): qty 1

## 2022-10-25 MED ORDER — ASPIRIN 81 MG PO CHEW
324.0000 mg | CHEWABLE_TABLET | Freq: Once | ORAL | Status: DC
  Filled 2022-10-25: qty 4

## 2022-10-25 MED ORDER — DOXYCYCLINE HYCLATE 100 MG PO TABS
100.0000 mg | ORAL_TABLET | Freq: Two times a day (BID) | ORAL | Status: DC
  Administered 2022-10-25 – 2022-10-26 (×2): 100 mg via ORAL
  Filled 2022-10-25 (×2): qty 1

## 2022-10-25 MED ORDER — LACTATED RINGERS IV BOLUS
1700.0000 mL | Freq: Once | INTRAVENOUS | Status: AC
  Administered 2022-10-25: 1700 mL via INTRAVENOUS

## 2022-10-25 MED ORDER — APIXABAN 5 MG PO TABS
5.0000 mg | ORAL_TABLET | Freq: Two times a day (BID) | ORAL | Status: DC
  Administered 2022-10-25 – 2022-11-03 (×18): 5 mg via ORAL
  Filled 2022-10-25 (×18): qty 1

## 2022-10-25 MED ORDER — ASPIRIN 81 MG PO TBEC
81.0000 mg | DELAYED_RELEASE_TABLET | Freq: Every day | ORAL | Status: DC
  Administered 2022-10-26 – 2022-11-03 (×9): 81 mg via ORAL
  Filled 2022-10-25 (×9): qty 1

## 2022-10-25 MED ORDER — INSULIN ASPART 100 UNIT/ML IJ SOLN: 0.0000 [IU] | Freq: Every day | INTRAMUSCULAR | Status: DC

## 2022-10-25 MED ORDER — ACETAMINOPHEN 500 MG PO TABS
1000.0000 mg | ORAL_TABLET | Freq: Once | ORAL | Status: AC
  Administered 2022-10-25: 1000 mg via ORAL
  Filled 2022-10-25: qty 2

## 2022-10-25 MED ORDER — ONDANSETRON HCL 4 MG PO TABS: 4.0000 mg | ORAL_TABLET | Freq: Four times a day (QID) | ORAL | Status: DC | PRN

## 2022-10-25 MED ORDER — ACETAMINOPHEN 650 MG RE SUPP: 650.0000 mg | Freq: Four times a day (QID) | RECTAL | Status: DC | PRN

## 2022-10-25 MED ORDER — ALBUMIN HUMAN 25 % IV SOLN
25.0000 g | Freq: Once | INTRAVENOUS | Status: AC
  Administered 2022-10-25: 25 g via INTRAVENOUS
  Filled 2022-10-25: qty 100

## 2022-10-25 MED ORDER — PIPERACILLIN-TAZOBACTAM 3.375 G IVPB 30 MIN
3.3750 g | Freq: Once | INTRAVENOUS | Status: AC
  Administered 2022-10-25: 3.375 g via INTRAVENOUS
  Filled 2022-10-25: qty 50

## 2022-10-25 MED ORDER — SODIUM CHLORIDE 0.9 % IV SOLN
500.0000 mg | Freq: Once | INTRAVENOUS | Status: AC
  Administered 2022-10-25: 500 mg via INTRAVENOUS
  Filled 2022-10-25: qty 5

## 2022-10-25 MED ORDER — POTASSIUM CHLORIDE 10 MEQ/100ML IV SOLN
10.0000 meq | INTRAVENOUS | Status: AC
  Administered 2022-10-25 (×2): 10 meq via INTRAVENOUS
  Filled 2022-10-25 (×2): qty 100

## 2022-10-25 MED ORDER — LACTATED RINGERS IV BOLUS
1000.0000 mL | Freq: Once | INTRAVENOUS | Status: AC
  Administered 2022-10-25: 1000 mL via INTRAVENOUS

## 2022-10-25 MED ORDER — ONDANSETRON HCL 4 MG/2ML IJ SOLN: 4.0000 mg | Freq: Four times a day (QID) | INTRAMUSCULAR | Status: DC | PRN

## 2022-10-25 MED ORDER — ACETAMINOPHEN 325 MG PO TABS
650.0000 mg | ORAL_TABLET | Freq: Four times a day (QID) | ORAL | Status: DC | PRN
  Administered 2022-10-26 – 2022-11-02 (×2): 650 mg via ORAL
  Filled 2022-10-25 (×2): qty 2

## 2022-10-25 MED ORDER — INSULIN ASPART 100 UNIT/ML IJ SOLN
0.0000 [IU] | Freq: Three times a day (TID) | INTRAMUSCULAR | Status: DC
  Administered 2022-10-28 (×2): 1 [IU] via SUBCUTANEOUS
  Administered 2022-10-29: 2 [IU] via SUBCUTANEOUS
  Administered 2022-10-30 – 2022-10-31 (×4): 1 [IU] via SUBCUTANEOUS
  Administered 2022-11-01: 2 [IU] via SUBCUTANEOUS
  Administered 2022-11-01 – 2022-11-03 (×5): 1 [IU] via SUBCUTANEOUS

## 2022-10-25 NOTE — ED Notes (Signed)
EDP made aware of pt's v/s. See new orders

## 2022-10-25 NOTE — H&P (Addendum)
History and Physical    Mason Hill ZOX:[id number] DOB: 1940-10-08 DOA: 10/25/2022  PCP: Nonda Lou, MD   Patient coming from: Home   Chief Complaint:  Chief Complaint  Patient presents with   Chest Pain    HPI:  Mason Hill is a 82 y.o. male with medical history significant of essential hypertension, CKD stage IIIa, hyperlipidemia, diabetes type 2, congestive heart failure, paroxysmal atrial fibrillation on Eliquis, paroxysmal junctional rhythm (declined PPM 2020), PVC and CAD presented to emergency department with complaining of midepigastric pain and chest pain.  Patient reported chest pain that travels to his left-sided arm and forearm.  He is complaining about abdominal pain for last 3 days.  At this point patient has has very nonspecific complaint.  Patient is complaining about occasional dry cough, progressive worsening bilateral lower extremity swelling, dyspnea and orthopnea.  Denies any denies any fever, chill, palpitation, headache, nausea, vomiting, diarrhea, and any urinary symptoms.   ED Course:  At ED initial presentation patient blood pressure was borderline low 100/52 and developed persistent hypotension blood pressure dropped to 89/58, developed temperature 100.6 F.  Respiratory 24 O2 sat 95% room air. Respiratory panel negative. Blood culture x 2 process. Lactic acid  trended up 2.5 to 3.5 CMP showed low potassium 3.3, sodium 141, chloride 106, bicarb 27, calcium 7.7, low albumin 1.9, creatinine 1.16 and GFR above 60. CBC showed leukocytosis 16.4, hemoglobin 11.2 and platelet 174. Patient has elevated BNP 664. Troponin trended 252 to 256.  EKG showed atrial fibrillation rate controlled 111, premature ventricular complex, abnormal lateral Q waves.  Minimum ST depression in the inferior lead.  Chest x-ray showed enlarged cardiac shallowed, tiny left effusion.  Medial lung base opacity left greater than the right, subtle infiltrate.  CT abdomen  pelvis: IMPRESSION: 1. Cardiomegaly and moderate pericardial effusion. 2. Small left-greater-than-right pleural effusions and atelectasis. 3. Evaluation of the abdomen and pelvis is markedly limited due to streak artifact and diffuse anasarca. 4. Diffuse mesenteric stranding and trace abdominopelvic ascites, possibly related to volume status. 5. Bladder wall thickening, findings can be seen in the setting of cystitis. Correlate with urinalysis. 6. Enlarged periaortic and bilateral inguinal lymph nodes, possibly reactive. Consider follow-up CT of the abdomen and pelvis in 3 months to ensure resolution. 7. Severe coronary artery calcifications and aortic Atherosclerosis (ICD10-I70.0).   With the concern for sepsis in the ED patient has been treated with LR bolus with septic protocol.  Started treating with Zosyn and azithromycin.  Cardiology Dr. Lenor Derrick has been consulted by EDP physician and per conversation with cardiology patient could have both component of sepsis as well as heart failure exacerbation at the same time.  At this point recommended to continue to treat for sepsis and it is okay to give IV fluid and albumin and hold the Lasix for tonight.  If patient does not move towards the right direction then cardiology will start Lasix from the morning.  Review of Systems:  Review of Systems  Constitutional:  Negative for chills, fever, malaise/fatigue and weight loss.  Respiratory:  Negative for cough.   Cardiovascular:  Positive for chest pain and leg swelling. Negative for palpitations.  Gastrointestinal:  Negative for abdominal pain, heartburn, nausea and vomiting.  Genitourinary:  Negative for dysuria and urgency.  Skin:  Negative for rash.  Neurological:  Negative for dizziness and headaches.  Psychiatric/Behavioral:  The patient is not nervous/anxious.     Past Medical History:  Diagnosis Date   A-fib (HCC)  AMI (acute myocardial infarction) (HCC)    Diabetes  mellitus without complication (HCC)    Hypertension     Past Surgical History:  Procedure Laterality Date   HERNIA REPAIR       reports that he has never smoked. He has never used smokeless tobacco. He reports that he does not drink alcohol and does not use drugs.  Allergies  Allergen Reactions   Codeine Other (See Comments) and Rash    Other Reaction: BLISTERING, PEELING.   Erythromycin    Isosorbide Nitrate     Other reaction(s): Headache    History reviewed. No pertinent family history.  Prior to Admission medications   Medication Sig Start Date End Date Taking? Authorizing Provider  amLODipine (NORVASC) 10 MG tablet Take 10 mg by mouth daily. 11/22/18   [provider]  losartan (COZAAR) 25 MG tablet Take 25 mg by mouth daily.    [provider]     Physical Exam: Vitals:   10/25/22 1700 10/25/22 1720 10/25/22 1730 10/25/22 1930  BP: (!) 89/53 (!) 89/58 (!) 97/53 (!) 110/52  Pulse: 70 70 78 78  Resp: 19 (!) 23 (!) 23 (!) 27  Temp:    98.4 F (36.9 C)  TempSrc:    Oral  SpO2: 97% 96% 95% 95%  Weight:      Height:        Physical Exam Constitutional:      Appearance: He is well-developed.  Cardiovascular:     Rate and Rhythm: Tachycardia present. Rhythm irregular.     Heart sounds: Normal heart sounds. Heart sounds not distant.     No systolic murmur is present.     No diastolic murmur is present.  Pulmonary:     Effort: Pulmonary effort is normal.     Breath sounds: Examination of the right-lower field reveals decreased breath sounds. Examination of the left-lower field reveals decreased breath sounds. Decreased breath sounds present. No wheezing, rhonchi or rales.  Musculoskeletal:     Cervical back: Normal range of motion.     Right lower leg: No tenderness.  Skin:    General: Skin is warm.  Neurological:     Mental Status: He is alert and oriented to person, place, and time.      Labs on Admission: I have personally reviewed  following labs and imaging studies  CBC: Recent Labs  Lab 10/25/22 1451  WBC 16.4*  NEUTROABS 14.3*  HGB 11.2*  HCT 35.0*  MCV 94.9  PLT 174   Basic Metabolic Panel: Recent Labs  Lab 10/25/22 1451  NA 141  K 3.3*  CL 106  CO2 27  GLUCOSE 105*  BUN 18  CREATININE 1.16  CALCIUM 7.7*   GFR: Estimated Creatinine Clearance: 53.9 mL/min (by C-G formula based on SCr of 1.16 mg/dL). Liver Function Tests: Recent Labs  Lab 10/25/22 1451  AST 25  ALT 13  ALKPHOS 46  BILITOT 1.4*  PROT 4.3*  ALBUMIN 1.9*   No results for input(s): "LIPASE", "AMYLASE" in the last 168 hours. No results for input(s): "AMMONIA" in the last 168 hours. Coagulation Profile: Recent Labs  Lab 10/25/22 1451  INR 1.2   Cardiac Enzymes: Recent Labs  Lab 10/25/22 1451 10/25/22 1719  TROPONINIHS 252* 256*   BNP (last 3 results) Recent Labs    10/25/22 1500  BNP 684.8*   HbA1C: No results for input(s): "HGBA1C" in the last 72 hours. CBG: No results for input(s): "GLUCAP" in the last 168 hours. Lipid  Profile: No results for input(s): "CHOL", "HDL", "LDLCALC", "TRIG", "CHOLHDL", "LDLDIRECT" in the last 72 hours. Thyroid Function Tests: No results for input(s): "TSH", "T4TOTAL", "FREET4", "T3FREE", "THYROIDAB" in the last 72 hours. Anemia Panel: No results for input(s): "VITAMINB12", "FOLATE", "FERRITIN", "TIBC", "IRON", "RETICCTPCT" in the last 72 hours. Urine analysis:    Component Value Date/Time   COLORURINE AMBER (A) 10/25/2022 1551   APPEARANCEUR CLEAR 10/25/2022 1551   LABSPEC 1.020 10/25/2022 1551   PHURINE 5.0 10/25/2022 1551   GLUCOSEU NEGATIVE 10/25/2022 1551   HGBUR NEGATIVE 10/25/2022 1551   BILIRUBINUR NEGATIVE 10/25/2022 1551   KETONESUR NEGATIVE 10/25/2022 1551   PROTEINUR NEGATIVE 10/25/2022 1551   NITRITE NEGATIVE 10/25/2022 1551   LEUKOCYTESUR NEGATIVE 10/25/2022 1551    Radiological Exams on Admission: I have personally reviewed images CT CHEST ABDOMEN  PELVIS W CONTRAST  Result Date: 10/25/2022 CLINICAL DATA:  Chest pain EXAM: CT CHEST, ABDOMEN, AND PELVIS WITH CONTRAST TECHNIQUE: Multidetector CT imaging of the chest, abdomen and pelvis was performed following the standard protocol during bolus administration of intravenous contrast. RADIATION DOSE REDUCTION: This exam was performed according to the departmental dose-optimization program which includes automated exposure control, adjustment of the mA and/or kV according to patient size and/or use of iterative reconstruction technique. CONTRAST:  75mL OMNIPAQUE IOHEXOL 350 MG/ML SOLN COMPARISON:  None Available. FINDINGS: Limited exam due to anasarca and streak artifact related to patient arm position. CT CHEST FINDINGS Cardiovascular: Cardiomegaly. Moderate pericardial effusion. Caliber thoracic aorta with moderate atherosclerotic disease. Severe coronary artery calcifications. Mediastinum/Nodes: Esophagus and thyroid are unremarkable. No enlarged lymph nodes seen in the chest. Lungs/Pleura: Central airways are patent. Small left-greater-than-right pleural effusions and atelectasis. No pneumothorax. Musculoskeletal: No chest wall mass or suspicious bone lesions identified. CT ABDOMEN PELVIS FINDINGS Hepatobiliary: No focal liver abnormality is seen, although evaluation of the liver is greatly limited due to artifact. Gallbladder is grossly normal. No evidence of biliary ductal dilation. Pancreas: Unremarkable. Spleen: Normal in size without focal abnormality. Adrenals/Urinary Tract: Bilateral adrenal glands are unremarkable. No hydronephrosis. Nonobstructing left renal stone. Bilateral low-attenuation renal lesions which are likely simple cysts, artifact limits evaluation, no specific follow-up imaging is necessary. Bladder wall thickening. Stomach/Bowel: Stomach is within normal limits. No definite bowel wall thickening, although evaluation of the bowel loops is markedly limited. No evidence of obstruction.  Vascular/Lymphatic: Aortic atherosclerosis. Mildly enlarged periaortic lymph node measuring 12 mm in short axis on series 3, image 75. Bilateral enlarged inguinal lymph nodes. Reference left inguinal lymph node measuring 1.6 mm in short axis on series 3, image 127. Reproductive: Prostate is unremarkable. Other: Diffuse body wall edema. Small fat containing periumbilical hernia. Diffuse mesenteric stranding and trace abdominopelvic ascites. Musculoskeletal: No acute or significant osseous findings. IMPRESSION: 1. Cardiomegaly and moderate pericardial effusion. 2. Small left-greater-than-right pleural effusions and atelectasis. 3. Evaluation of the abdomen and pelvis is markedly limited due to streak artifact and diffuse anasarca. 4. Diffuse mesenteric stranding and trace abdominopelvic ascites, possibly related to volume status. 5. Bladder wall thickening, findings can be seen in the setting of cystitis. Correlate with urinalysis. 6. Enlarged periaortic and bilateral inguinal lymph nodes, possibly reactive. Consider follow-up CT of the abdomen and pelvis in 3 months to ensure resolution. 7. Severe coronary artery calcifications and aortic Atherosclerosis (ICD10-I70.0). Electronically Signed   By: Allegra Lai M.D.   On: 10/25/2022 18:41   DG Chest Port 1 View  Result Date: 10/25/2022 CLINICAL DATA:  Shortness of breath EXAM: PORTABLE CHEST 1 VIEW COMPARISON:  X-ray 04/14/2009  FINDINGS: Enlarged cardiopericardial silhouette. Calcified aorta. Prominent central vasculature. Tiny left effusion. Mild lung base opacities. Overlapping cardiac leads. IMPRESSION: Enlarged cardiopericardial silhouette. Tiny left effusion. Mild lung base opacities, left-greater-than-right. Subtle infiltrates possible. Recommend follow-up. Electronically Signed   By: Karen Kays M.D.   On: 10/25/2022 15:21    EKG: My personal interpretation of EKG shows:  EKG showed atrial fibrillation rate controlled 111, premature ventricular  complex, abnormal lateral Q waves.  Minimum ST depression in the inferior lead.    Assessment/Plan: Principal Problem:   Severe sepsis (HCC) Active Problems:   CAP (community acquired pneumonia)   Pericardial effusion   NSTEMI (non-ST elevated myocardial infarction) (HCC)   Elevated troponin   Essential hypertension   Chronic kidney disease (CKD), stage III (moderate) (HCC)   Paroxysmal atrial fibrillation (HCC)   DM (diabetes mellitus) type II controlled with renal manifestation (HCC)   Chronic systolic CHF (congestive heart failure) (HCC)   History of coronary artery disease   Hypokalemia    Assessment and Plan: Severe sepsis CAP -Patient coming with complaining of midepigastric and chest pain for last 3 days.  His history is very nonspecific.  Workup revealed leukocytosis 16.4. Initial lactic acid was 2.5 which trended up to 3.5 after 2 L of LR bolus and the third troponin started trending down to 3.01.  Patient also found to have high temperature 100.6 F and hypotensive 89/58.  Patient meets criteria for sepsis.  -Chest x-ray showed mild lung base opacities left greater than the right side with subtle infiltrate.  Concern for pneumonia. - Severe sepsis likely secondary in the setting of pneumonia. - Rapid respiratory panel is negative. - Pending blood culture, sputum culture, urine Legionella and urine Streptococcus antigen. - In the ED patient received Zosyn and azithromycin once. - Plan to continue broad-spectrum coverage with IV ceftriaxone 1 g daily and doxycycline 100 mg IV twice daily (per chart review patient is allergic to erythromycin). -Will follow-up with culture result for antibiotic guidance - In the ED patient received LR 2.7 L total bolus.  Continue maintenance fluid LR 75 cc/h for 1 day.   Generalized anasarca -Patient has low albumin 1.9.  Physical exam showed generalized anasarca from abdomen to lower extremities.  CT abdomen pelvis showed abdomen and pelvis  diffuse anasarca. - Generalized anasarca likely secondary from in the setting of CKD, heart failure exacerbation and low albumin.   -As per discussion above with cardiology ordering albumin 25 g IV once.   Pericardial effusion -CT chest showed cardiomegaly and moderate pericardial effusion.  Unclear etiology of pericardial effusion at this time.  It could be viral etiology versus chronic pericardial effusion.  At this time treating for community-acquired pneumonia.  Cardiology Dr. Sande Brothers has performed bedside echocardiogram which did not showed any evidence of tamponade. -Will follow-up with cardiology plan regarding management of pericardial effusion. -Obtain echocardiogram.   Elevated troponin -Initial troponin 252 which trended up to 256.  Patient is complaining about chest pain that radiating to his left side of the arm which has been subsided now.  Currently his chest pain-free. -EKG showed atrial fibrillation rate controlled 111, premature ventricular complex, abnormal lateral Q waves.  Minimum ST depression in the inferior lead. -Elevated troponin due to nstmi vs demand ischemia in the setting of severe sepsis.  No need for heparin drip at this point. - Cardiology has been consulted on board. - Giving patient aspirin 325 mg once now and continue aspirin 81 mg daily.  Plan to continue Eliquis  5 mg twice daily for now. -Continue to trend troponin, monitor for development of any chest pain and continue cardiac monitoring.  Concern for CHF exacerbation History of diastolic heart failure -Patient has history of diastolic heart failure.  Unable to find out baseline echocardiogram and ejection fraction. - Consulted pharmacy for home medication reconciliation. - At home patient taking amlodipine, losartan and Jardiance. - In the setting of severe sepsis holding home blood pressure regimen - Patient has generalized anasarca from abdomen to up to ankle.  Elevated BNP 684.  Chest x-ray  showed cardiomegaly, bilateral lung infiltrate.  Concern for CHF exacerbation however at the same time patient has severe sepsis as well so unable to start any IV diuretic therapy at this time. - Cardiology Dr. Raynelle Jan has been consulted recommended holding diuretics for tonight and will assess the volume status in the morning and trend lactic acid.  Cardiology will continue to follow and and will recommend to start diuretics based on patient overall progression. -Obtaining echocardiogram.   Essential hypertension -Patient reported at home he is on losartan, spironolactone, Jardiance and Lasix.  Holding blood pressure regimen in the setting of sepsis.   CKD stage 3A -CMP showed creatinine 1.16 and GFR above 60.  Unknown and unclear baseline.  Per chart review patient has history of CKD stage 3a.  Creatinine was 0.97  years ago.  Unclear patient has AKI or not based on unclear baseline.  However continue to monitor renal function very closely.  Continue to monitor urine output.  Avoid nephrotoxic agent.  Paroxysmal atrial fibrillation on Eliquis -EKG showed atrial fibrillation heart rate 111. - Continue Eliquis 5 mg twice daily. -Pending home medication reconciliation. -Continue cardiac monitoring.   History of CAD -Patient has history of CAD.  [facility]ology [facility]. -Per cardiology note in chart review from 07/05/2022 patient is only on Lipitor 40 mg daily. - Continue Lipitor for now.  Pending home medication reconciliation by pharmacy.   Hypokalemia -Low potassium 3.3. - Repleting with IV KCl 10 x 2 mEq -Checking mag level.  Continue to monitor electrolytes and replete as needed.  DM type II -Patient reported history of DM type II.  Unclear patient is on insulin or not.  No record of A1c on the chart.  Checking A1c level. - Holding Jardiance in the setting of sepsis and hypotension. - Continue low sliding scale SSI as needed with mealtime coverage. - Continue carb  consistent diet.    DVT prophylaxis:  Eliquis Code Status:  Full Code Diet: Heart healthy and carb consistent diet.  Fluid restriction 2 L/day and salt restriction 2 g/day. Family Communication: Discussed treatment plan with patient's family at the bedside Disposition Plan: Pending clinical improvement.  And pending disposition Consults: Cardiology Admission status:   Inpatient,  progressive unit  Severity of Illness: The appropriate patient status for this patient is INPATIENT. Inpatient status is judged to be reasonable and necessary in order to provide the required intensity of service to ensure the patient's safety. The patient's presenting symptoms, physical exam findings, and initial radiographic and laboratory data in the context of their chronic comorbidities is felt to place them at high risk for further clinical deterioration. Furthermore, it is not anticipated that the patient will be medically stable for discharge from the hospital within 2 midnights of admission.   * I certify that at the point of admission it is my clinical judgment that the patient will require inpatient hospital care spanning beyond 2 midnights from the point  of admission due to high intensity of service, high risk for further deterioration and high frequency of surveillance required.Marland Kitchen    Tereasa Coop, MD [facility]  How to contact the [facility] Attending or Consulting provider 7A - 7P or covering provider during after hours 7P -7A, for this patient.  Check the care team in [facility] and look for a) attending/consulting [facility] provider listed and b) the [facility] team listed Log into [email or web address] and use [place]'s universal password to access. If you do not have the password, please contact the hospital operator. Locate the [facility] provider you are looking for under [facility] and page to a number that you can be directly reached. If you still have difficulty reaching the provider, please page the [facility]  (Director on Call) for the Hospitalists listed on amion for assistance.  10/25/2022, 8:11 PM

## 2022-10-25 NOTE — Progress Notes (Signed)
ED Pharmacy Antibiotic Sign Off An antibiotic consult was received from an ED provider for zosyn per pharmacy dosing for intra-abdominal infection. A chart review was completed to assess appropriateness.   The following one time order(s) were placed:  Zosyn 3.375 g IV x 1   Further antibiotic and/or antibiotic pharmacy consults should be ordered by the admitting provider if indicated.   Thank you for allowing pharmacy to be a part of this patient's care.   Griffin Dakin, [facility]  Clinical Pharmacist 10/25/22 5:46 PM

## 2022-10-25 NOTE — ED Provider Notes (Signed)
[facility] AT [facility] Provider Note   CSN: [id number] Arrival date & time: 10/25/22  1412     History  Chief Complaint  Patient presents with   Chest Pain    [facility] [facility] is a 82 y.o. male.  82 yo M with a chief complaints of chest pain.  This started about 3 hours ago.  He was resting at the time.  Nothing seems to make it better or worse.  He denies any difficulty breathing denies nausea or vomiting.  Was found to be febrile with EMS, also hypoxic.  Started on oxygen not on oxygen at home.  Patient denies any fevers at home denies urinary symptoms denies cough or congestion.   Chest Pain      Home Medications Prior to Admission medications   Medication Sig Start Date End Date Taking? Authorizing Provider  amLODipine (NORVASC) 10 MG tablet Take 10 mg by mouth daily. 11/22/18   [provider]  losartan (COZAAR) 25 MG tablet Take 25 mg by mouth daily.    [provider]      Allergies    Codeine, Erythromycin, and Isosorbide nitrate    Review of Systems   Review of Systems  Cardiovascular:  Positive for chest pain.    Physical Exam Updated Vital Signs BP (!) 100/52 (BP Location: Right Arm)   Pulse (!) 120   Temp 99.4 F (37.4 C)   Resp (!) 24   Ht 6' (1.829 m)   Wt 90.7 kg   SpO2 95%   BMI 27.12 kg/m  Physical Exam Vitals and nursing note reviewed.  Constitutional:      Appearance: He is well-developed.     Comments: Chronically ill-appearing.  HENT:     Head: Normocephalic and atraumatic.  Eyes:     Pupils: Pupils are equal, round, and reactive to light.  Neck:     Vascular: No JVD.  Cardiovascular:     Rate and Rhythm: Normal rate and regular rhythm.     Heart sounds: No murmur heard.    No friction rub. No gallop.  Pulmonary:     Effort: No respiratory distress.     Breath sounds: No wheezing.  Abdominal:     General: There is no distension.     Tenderness: There is no abdominal tenderness.  There is no guarding or rebound.  Musculoskeletal:        General: Normal range of motion.     Cervical back: Normal range of motion and neck supple.     Right lower leg: Edema present.     Left lower leg: Edema present.     Comments: 2+ to the thighs   Skin:    Coloration: Skin is not pale.     Findings: No rash.  Neurological:     Mental Status: He is alert and oriented to person, place, and time.  Psychiatric:        Behavior: Behavior normal.     ED Results / Procedures / Treatments   Labs (all labs ordered are listed, but only abnormal results are displayed) Labs Reviewed  PROTIME-INR - Abnormal; Notable for the following components:      Result Value   Prothrombin Time 15.7 (*)    All other components within normal limits  CBC WITH DIFFERENTIAL/PLATELET - Abnormal; Notable for the following components:   WBC 16.4 (*)    RBC 3.69 (*)    Hemoglobin 11.2 (*)    HCT 35.0 (*)  Neutro Abs 14.3 (*)    Lymphs Abs 0.3 (*)    Monocytes Absolute 1.6 (*)    Abs Immature Granulocytes 0.19 (*)    All other components within normal limits  I-STAT CG4 LACTIC ACID, ED - Abnormal; Notable for the following components:   Lactic Acid, Venous 2.5 (*)    All other components within normal limits  CULTURE, BLOOD (ROUTINE X 2)  CULTURE, BLOOD (ROUTINE X 2)  SARS CORONAVIRUS 2 BY RT PCR  COMPREHENSIVE METABOLIC PANEL  CBC WITH DIFFERENTIAL/PLATELET  URINALYSIS, W/ REFLEX TO CULTURE (INFECTION SUSPECTED)  BRAIN NATRIURETIC PEPTIDE  TROPONIN I (HIGH SENSITIVITY)    EKG EKG Interpretation Date/Time:  Thursday October 25 2022 14:29:31 EDT Ventricular Rate:  111 PR Interval:    QRS Duration:  86 QT Interval:  464 QTC Calculation: 539 R Axis:   -86  Text Interpretation: Atrial fibrillation Paired ventricular premature complexes Consider inferior infarct Abnormal lateral Q waves Anterior infarct, old Minimal ST depression, inferior leads Prolonged QT interval No significant change  since last tracing Confirmed by Melene Plan [phone]) on 10/25/2022 2:58:14 PM  Radiology DG Chest Port 1 View  Result Date: 10/25/2022 CLINICAL DATA:  Shortness of breath EXAM: PORTABLE CHEST 1 VIEW COMPARISON:  X-ray 04/14/2009 FINDINGS: Enlarged cardiopericardial silhouette. Calcified aorta. Prominent central vasculature. Tiny left effusion. Mild lung base opacities. Overlapping cardiac leads. IMPRESSION: Enlarged cardiopericardial silhouette. Tiny left effusion. Mild lung base opacities, left-greater-than-right. Subtle infiltrates possible. Recommend follow-up. Electronically Signed   By: Karen Kays M.D.   On: 10/25/2022 15:21    Procedures Procedures    Medications Ordered in ED Medications  cefTRIAXone (ROCEPHIN) 2 g in sodium chloride 0.9 % 100 mL IVPB (has no administration in time range)  azithromycin (ZITHROMAX) 500 mg in sodium chloride 0.9 % 250 mL IVPB (has no administration in time range)    ED Course/ Medical Decision Making/ A&P                                 Medical Decision Making Amount and/or Complexity of Data Reviewed Labs: ordered. Radiology: ordered.   82 yo M with a chief complaints of chest pain.  Started about 3 hours ago.  Patient pain sounds atypical's pinpoint no radiation.  Not exertional.  Patient was febrile with EMS, also newly hypoxic.  Started on oxygen.  Will obtain a laboratory evaluation to screen for infection with a chest x-ray UA.  COVID test.  Reassess.  Chest x-ray independently interpreted by me with likely left lower lobe infiltrate.  Will start on antibiotics.  Awaiting blood work.  Patient care was signed out to Dr. Lockie Mola, please see their note for further details care in the ED.   The patients results and plan were reviewed and discussed.   Any x-rays performed were independently reviewed by myself.   Differential diagnosis were considered with the presenting HPI.  Medications  cefTRIAXone (ROCEPHIN) 2 g in sodium chloride 0.9 %  100 mL IVPB (has no administration in time range)  azithromycin (ZITHROMAX) 500 mg in sodium chloride 0.9 % 250 mL IVPB (has no administration in time range)    Vitals:   10/25/22 1426 10/25/22 1429  BP:  (!) 100/52  Pulse:  (!) 120  Resp:  (!) 24  Temp:  99.4 F (37.4 C)  SpO2:  95%  Weight: 90.7 kg   Height: 6' (1.829 m)     Final diagnoses:  Pneumonia of left lower lobe due to infectious organism          Final Clinical Impression(s) / ED Diagnoses Final diagnoses:  Pneumonia of left lower lobe due to infectious organism    Rx / DC Orders ED Discharge Orders     None         Melene Plan, DO 10/25/22 1530

## 2022-10-25 NOTE — ED Triage Notes (Addendum)
Pt BIB [facility] from home. Pt with sudden onset of substernal CP without radiation. Denies ShOB, nausea, or diaphoresis. EMS admin 324 mg of ASA, 4L O2, and 1000 mL of NaCl. Pt with hx of AMI and a-fib.   EMS Vitals   82/40 A-fib @ 130 Temp 100.4 RR 24 SpO2 88% on R/A.

## 2022-10-25 NOTE — Progress Notes (Signed)
[facility] is following code sepsis 

## 2022-10-25 NOTE — Consult Note (Signed)
Cardiology Consultation   Patient ID: Mason Hill MRN: [id number]; DOB: 1940-06-22  Admit date: 10/25/2022 Date of Consult: 10/25/2022  PCP:  Nonda Lou, MD   [facility] Providers Cardiologist:  [facility]   Consult question: pericardial effusion  Patient Profile:   Mason Hill is a 82 y.o. male with a hx of paroxysmal atrial fibrillation, paroxysmal junctional rhythm (declined PPM 2020), CAD , chronic diastolic heart failure, NSVT, hypertension, dyslipidemia, PAD required intervention, type 2 diabetes, who is being seen 10/25/2022 for the evaluation of chest pain, elevated troponin, pericardial effusion at the request of Dr. Lockie Mola.  History of Present Illness:   Mason Hill with above past medical history presented to the ER today for complaints for abdominal pain  Admission diagnostic workup so far revealed hypokalemia 3.3, creatinine 1.16, GFR >60, total bilirubin 1.4.  BNP 684.  High sensitive troponin 252 >256.  Lactic acid elevated 2.5-3.5.  CBC did reveal leukocytosis 16 400, hemoglobin 11.2.INR 1.2.  Urinalysis grossly unremarkable for infection.  Blood culture x 2 sent. CT chest revealed cardiomegaly, moderate pericardial effusion, small left greater than right pleural effusions and atelectasis, limited abdominal/pelvis evaluation due to diffuse anasarca and artifacts, diffuse mesenteric stranding and trace abdominopelvic ascites, bladder wall thickening with possible cystitis, enlarged periaortic and bilateral inguinal lymph node possibly reactive, severe coronary artery calcification and aortic atherosclerosis.EKG revealed A-fib with RVR 111bpm, low voltage QRS, poor R wave progression, nonspecific ST abnormality.  He is febrile with temperature 100.6, hypotensive with blood pressure down to 78/49, tachycardic with heart rate up to 120 at ED. He was given 1 g Tylenol, azithromycin, ceftriaxone, 2700 mL LR bolus followed by LR at 100 mL/h, and Zosyn at ED.  Cardiology is consulted given abnormal troponin, chest pain, and pericardial effusion.   Per chart review, patient follows [facility] cardiology.  He has known CAD, suffered an NSTEMI 08/2011, LHC revealed insignificant CAD except 100% occluded small OM 2.  He was medically managed.   He has paroxysmal atrial fibrillation and is on anticoagulation Eliquis.  Holter monitor 12/07/2020 revealed PVC burden 9.1%, NSVT, permanent A-fib.  Repeat monitor later revealed A-fib burden 31%.  He had a paroxysmal junctional rhythm in the past, beta-blocker had been stopped, declined PPM 2020.  He had several admission for acute diastolic heart failure in the past, it was felt PVC and atrial fibrillation contributing for decompensation, PPM was discussed again but he had not agreed.  Most recent echo completed on 06/21/2022 revealing LVEF > 55% mild LVH, mild RV systolic dysfunction, mildly dilated PA, moderately enlarged RV and RA, trivial pericardial effusion, no significant valvular disease.  He was last seen in the office 07/05/2022, was instructed to take Lasix 40 mg twice daily, continue losartan 100 mg daily, amlodipine 2.5 mg daily, spironolactone 50 mg daily, Jardiance 10 mg daily, atorvastatin 40 mg daily, and Eliquis for his CAD/diastolic heart failure/atrial fibrillation  Patient notes that he is feeling much better.   Notes that his chief complaint was abdominal pain; Daugther notes he has been having a new bulge in his abdomen but recently had new pain just below his xiphoid process.    Has had no chest pain, chest pressure, chest tightness, chest stinging . His prior anginal equivalent was left shoulder pain  No shortness of breath, DOE .  No PND or orthopnea.  Does not check his weight at home.  Has worsening leg swelling, not as bad as his last heart failure hospitalization; that  occurred in the setting of testicular swelling.  No sx when in AF.  Has high PVC burden but has had this all  his life, per daugther.  He see a Cardiology PA at [facility].  He did not know about his hypoalbuminemia but was told to eat more protein.  He is known to have CKD but unclear baseline creatinine.   Past Medical History:  Diagnosis Date   A-fib [facility])    AMI (acute myocardial infarction) (HCC)    Diabetes mellitus without complication (HCC)    Hypertension     Past Surgical History:  Procedure Laterality Date   HERNIA REPAIR       Home Medications:  Prior to Admission medications   Medication Sig Start Date End Date Taking? Authorizing Provider  amLODipine (NORVASC) 10 MG tablet Take 10 mg by mouth daily. 11/22/18   [provider]  losartan (COZAAR) 25 MG tablet Take 25 mg by mouth daily.    [provider]    Inpatient Medications: Scheduled Meds:  apixaban  5 mg Oral BID   aspirin  324 mg Oral Once   [START ON 10/26/2022] aspirin EC  81 mg Oral Daily   atorvastatin  40 mg Oral Daily   doxycycline  100 mg Oral Q12H   insulin aspart  0-5 Units Subcutaneous QHS   [START ON 10/26/2022] insulin aspart  0-6 Units Subcutaneous TID WC   sodium chloride flush  3 mL Intravenous Q12H   Continuous Infusions:  sodium chloride     albumin human     [START ON 10/26/2022] cefTRIAXone (ROCEPHIN)  IV     lactated ringers 75 mL/hr at 10/25/22 1946   potassium chloride     PRN Meds: sodium chloride, acetaminophen **OR** acetaminophen, ondansetron **OR** ondansetron (ZOFRAN) IV, sodium chloride flush  Allergies:    Allergies  Allergen Reactions   Codeine Other (See Comments) and Rash    Other Reaction: BLISTERING, PEELING.   Erythromycin    Isosorbide Nitrate     Other reaction(s): Headache    Social History:   Social History   Socioeconomic History   Marital status: Divorced    Spouse name: Not on file   Number of children: Not on file   Years of education: Not on file   Highest education level: Not on file  Occupational History   Not on file  Tobacco Use    Smoking status: Never   Smokeless tobacco: Never  Vaping Use   Vaping status: Never Used  Substance and Sexual Activity   Alcohol use: Never   Drug use: Never   Sexual activity: Never  Other Topics Concern   Not on file  Social History Narrative   Not on file   Social Determinants of Health   Financial Resource Strain: Low Risk  (06/21/2022)   Received from [facility]   Overall Financial Resource Strain (CARDIA)    Difficulty of Paying Living Expenses: Not hard at all  Food Insecurity: No Food Insecurity (06/21/2022)   Received from [facility]   Hunger Vital Sign    Worried About Running Out of Food in the Last Year: Never true    Ran Out of Food in the Last Year: Never true  Transportation Needs: No Transportation Needs (06/21/2022)   Received from [facility] - Transportation    In the past 12 months, has lack of transportation kept you from medical appointments or from getting medications?: No  Lack of Transportation (Non-Medical): No  Physical Activity: Insufficiently Active (07/24/2019)   Received from [facility]   Exercise Vital Sign    Days of Exercise per Week: 2 days    Minutes of Exercise per Session: 60 min  Stress: No Stress Concern Present (07/24/2019)   Received from [facility] - Occupational Stress Questionnaire    Feeling of Stress : Not at all  Social Connections: Socially Integrated (07/24/2019)   Received from [facility]   Social Connection and Isolation Panel [NHANES]    Frequency of Communication with Friends and Family: Three times a week    Frequency of Social Gatherings with Friends and Family: Once a week    Attends Religious Services: More than 4 times per year    Active Member of [employer] or Organizations: Yes    Attends Engineer, structural: More than 4 times per year    Marital  Status: Married  Catering manager Violence: Not on file    Family History:   History reviewed. No pertinent family history.   ROS:  All other ROS reviewed and negative.     Physical Exam/Data:   Vitals:   10/25/22 1700 10/25/22 1720 10/25/22 1730 10/25/22 1930  BP: (!) 89/53 (!) 89/58 (!) 97/53 (!) 110/52  Pulse: 70 70 78 78  Resp: 19 (!) 23 (!) 23 (!) 27  Temp:    98.4 F (36.9 C)  TempSrc:    Oral  SpO2: 97% 96% 95% 95%  Weight:      Height:        Intake/Output Summary (Last 24 hours) at 10/25/2022 2018 Last data filed at 10/25/2022 1947 Gross per 24 hour  Intake 2135.48 ml  Output --  Net 2135.48 ml      10/25/2022    2:26 PM 07/17/2019    9:01 AM  Last 3 Weights  Weight (lbs) 200 lb 201 lb  Weight (kg) 90.719 kg 91.173 kg     Body mass index is 27.12 kg/m.  General:  Elderly male, no distress HEENT: normal Vascular: No carotid bruits; Distal pulses 2+ bilaterally Cardiac:  normal S1, S2; IRIR Lungs:  Crackles in bases  Abd: soft, nontender, no hepatomegaly, small abdominal hernia  Ext: +1 edema bilaterally Musculoskeletal:  No deformities, BUE and BLE strength normal and equal Skin: warm; feet are cool but legs are warm Neuro:  CNs 2-12 intact, no focal abnormalities noted Psych:  Normal affect   EKG:  The EKG was personally reviewed and demonstrates:  AF with frequent PVCs  Relevant CV Studies:   Echo 06/21/22:  ECHOCARDIOGRAPHIC DESCRIPTIONS  AORTIC ROOT                   Size: Normal             Dissection: INDETERM FOR DISSECTION  AORTIC VALVE               Leaflets: Tricuspid                   Morphology: Normal               Mobility: Fully mobile  LEFT VENTRICLE                   Size: Normal  Anterior: Normal            Contraction: Normal                         Lateral: Normal             Closest EF: >55% (Estimated)                Septal: Normal              LV Masses: No Masses                       Apical:  Normal                    LVH: MILD LVH CONCENTRIC           Inferior: Normal                                                      Posterior: Normal           Dias.FxClass: N/A  MITRAL VALVE               Leaflets: Normal                        Mobility: Fully mobile             Morphology: Normal  LEFT ATRIUM                   Size: MILDLY ENLARGED              LA Masses: No masses              IA Septum: Normal IAS  MAIN PA                   Size: MILDLY DILATED  PULMONIC VALVE             Morphology: Normal                        Mobility: Fully mobile  RIGHT VENTRICLE              RV Masses: No Masses                         Size: MODERATELY ENLARGED              Free Wall: HYPOCONTRACTILE            Contraction: MILD GLOBAL DECREASE  TRICUSPID VALVE               Leaflets: Normal                        Mobility: Fully mobile             Morphology: Normal  RIGHT ATRIUM                   Size: MODERATELY ENLARGED           RA Other: None                RA Mass: No masses  PERICARDIUM  Fluid: TRIVIAL EFFUSION ANTERIOR  INFERIOR VENACAVA                   Size: DILATED ABNORMAL RESPIRATORY COLLAPSE   Laboratory Data:  High Sensitivity Troponin:   Recent Labs  Lab 10/25/22 1451 10/25/22 1719  TROPONINIHS 252* 256*     Chemistry Recent Labs  Lab 10/25/22 1451  NA 141  K 3.3*  CL 106  CO2 27  GLUCOSE 105*  BUN 18  CREATININE 1.16  CALCIUM 7.7*  GFRNONAA >60  ANIONGAP 8    Recent Labs  Lab 10/25/22 1451  PROT 4.3*  ALBUMIN 1.9*  AST 25  ALT 13  ALKPHOS 46  BILITOT 1.4*   Lipids No results for input(s): "CHOL", "TRIG", "HDL", "LABVLDL", "LDLCALC", "CHOLHDL" in the last 168 hours.  Hematology Recent Labs  Lab 10/25/22 1451  WBC 16.4*  RBC 3.69*  HGB 11.2*  HCT 35.0*  MCV 94.9  MCH 30.4  MCHC 32.0  RDW 13.7  PLT 174   Thyroid No results for input(s): "TSH", "FREET4" in the last 168 hours.  BNP Recent Labs  Lab 10/25/22 1500   BNP 684.8*    DDimer No results for input(s): "DDIMER" in the last 168 hours.   Radiology/Studies:  CT CHEST ABDOMEN PELVIS W CONTRAST  Result Date: 10/25/2022 CLINICAL DATA:  Chest pain EXAM: CT CHEST, ABDOMEN, AND PELVIS WITH CONTRAST TECHNIQUE: Multidetector CT imaging of the chest, abdomen and pelvis was performed following the standard protocol during bolus administration of intravenous contrast. RADIATION DOSE REDUCTION: This exam was performed according to the departmental dose-optimization program which includes automated exposure control, adjustment of the mA and/or kV according to patient size and/or use of iterative reconstruction technique. CONTRAST:  75mL OMNIPAQUE IOHEXOL 350 MG/ML SOLN COMPARISON:  None Available. FINDINGS: Limited exam due to anasarca and streak artifact related to patient arm position. CT CHEST FINDINGS Cardiovascular: Cardiomegaly. Moderate pericardial effusion. Caliber thoracic aorta with moderate atherosclerotic disease. Severe coronary artery calcifications. Mediastinum/Nodes: Esophagus and thyroid are unremarkable. No enlarged lymph nodes seen in the chest. Lungs/Pleura: Central airways are patent. Small left-greater-than-right pleural effusions and atelectasis. No pneumothorax. Musculoskeletal: No chest wall mass or suspicious bone lesions identified. CT ABDOMEN PELVIS FINDINGS Hepatobiliary: No focal liver abnormality is seen, although evaluation of the liver is greatly limited due to artifact. Gallbladder is grossly normal. No evidence of biliary ductal dilation. Pancreas: Unremarkable. Spleen: Normal in size without focal abnormality. Adrenals/Urinary Tract: Bilateral adrenal glands are unremarkable. No hydronephrosis. Nonobstructing left renal stone. Bilateral low-attenuation renal lesions which are likely simple cysts, artifact limits evaluation, no specific follow-up imaging is necessary. Bladder wall thickening. Stomach/Bowel: Stomach is within normal limits.  No definite bowel wall thickening, although evaluation of the bowel loops is markedly limited. No evidence of obstruction. Vascular/Lymphatic: Aortic atherosclerosis. Mildly enlarged periaortic lymph node measuring 12 mm in short axis on series 3, image 75. Bilateral enlarged inguinal lymph nodes. Reference left inguinal lymph node measuring 1.6 mm in short axis on series 3, image 127. Reproductive: Prostate is unremarkable. Other: Diffuse body wall edema. Small fat containing periumbilical hernia. Diffuse mesenteric stranding and trace abdominopelvic ascites. Musculoskeletal: No acute or significant osseous findings. IMPRESSION: 1. Cardiomegaly and moderate pericardial effusion. 2. Small left-greater-than-right pleural effusions and atelectasis. 3. Evaluation of the abdomen and pelvis is markedly limited due to streak artifact and diffuse anasarca. 4. Diffuse mesenteric stranding and trace abdominopelvic ascites, possibly related to volume status. 5. Bladder wall thickening, findings can be seen in the  setting of cystitis. Correlate with urinalysis. 6. Enlarged periaortic and bilateral inguinal lymph nodes, possibly reactive. Consider follow-up CT of the abdomen and pelvis in 3 months to ensure resolution. 7. Severe coronary artery calcifications and aortic Atherosclerosis (ICD10-I70.0). Electronically Signed   By: Allegra Lai M.D.   On: 10/25/2022 18:41   DG Chest Port 1 View  Result Date: 10/25/2022 CLINICAL DATA:  Shortness of breath EXAM: PORTABLE CHEST 1 VIEW COMPARISON:  X-ray 04/14/2009 FINDINGS: Enlarged cardiopericardial silhouette. Calcified aorta. Prominent central vasculature. Tiny left effusion. Mild lung base opacities. Overlapping cardiac leads. IMPRESSION: Enlarged cardiopericardial silhouette. Tiny left effusion. Mild lung base opacities, left-greater-than-right. Subtle infiltrates possible. Recommend follow-up. Electronically Signed   By: Karen Kays M.D.   On: 10/25/2022 15:21      Assessment and Plan:   Pericardial effusion - beside echocardiogram performed; mild to moderate effusion on bedside without tachycardia, RV collapse, or RA collapse.  Unable to roll in ED hospital bed LL decubitus position - formal echo in AM  Acute diastolic heart failure Protein calorie Malnutrition CKD unknown baseline - prior to evaluation, concern for low flow state - with 3 L his lactate is improving; has WBC, new fevers, and hypotension - holding his GDMT for hypotension - given albumin - need nutrition consult for albumin of 1.9 - before the end of his hospitalization, he will need diuresis for total body volume unless he auto-diuresis (if improves, as early as tomorrow)  Non-STEMI CAD - low elevation of hs troponin given his known obstructive OM2 disease - continue ASA and statin for now - long term may consider DOAC monotherapy (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  New York Heart Association (NYHA) Functional Class NYHA Class II  CHA2DS2-VASc Score = 5   This indicates a 7.2% annual risk of stroke. The patient's score is based upon: CHF History: 1 HTN History: 1 Diabetes History: 0 Stroke History: 0 Vascular Disease History: 1 Age Score: 2 Gender Score: 0         For questions or updates, please contact [facility] Please consult  [email or web address] for contact info under   Riley Lam, MD FASE [facility] Cardiologist [facility]  [address] [place], [address] [place], [place] [address] [phone]  8:25 PM

## 2022-10-25 NOTE — ED Provider Notes (Signed)
Patient signed out to me with chest pain abdominal pain.  He has fever.  Sepsis workup initiated with broad-spectrum IV antibiotics.  I suspect may be pulmonary or abdominal infection.  Could be viral.  Little bit of swelling in his legs.  Cardiac workup has been pursued as well as including troponin and lactic acid.  He has a history of nonischemic cardiomyopathy.  Lab work came back per my review interpretation with a lactic acid of 2.5.  White count is 16.  May be pneumonia on chest x-ray.  However troponin came back at 252 which was trended to 256.  BNP was around 700.  Lactic acid increased to 3.5 after liter of fluids.  Blood pressure got soft in the 80s systolic and 30 cc per per kilogram of IV fluids were ordered.  CT scan of chest abdomen pelvis was ordered to further evaluate for infectious process.  Seems less likely to be cardiogenic shock given that he has a fever and a white count.  CT scan of the chest abdomen pelvis per radiology report did not show any acute infectious process.  Did show pericardial effusion and some diffuse anasarca.  I consulted cardiology Dr. C who came down and evaluated the patient.  There was no obvious tamponade.  I admitted patient to medicine to further rule out sepsis.  There is no evidence of cellulitis.  He is mentating well.  Overall broad-spectrum IV antibiotics have been started.  Lactic acid is improving.  He has completed IV fluids.  Troponin stable.  He has been admitted to medicine for further care.  Hemodynamics improved following IV fluids blood pressure 110 systolic upon admission.  Overall patient admitted for further sepsis workup.  .Critical Care  Performed by: Virgina Norfolk, DO Authorized by: Virgina Norfolk, DO   Critical care provider statement:    Critical care time (minutes):  45   Critical care was necessary to treat or prevent imminent or life-threatening deterioration of the following conditions:  Sepsis and shock   Critical care was time  spent personally by me on the following activities:  Blood draw for specimens, development of treatment plan with patient or surrogate, evaluation of patient's response to treatment, examination of patient, discussions with primary provider, obtaining history from patient or surrogate, ordering and performing treatments and interventions, ordering and review of laboratory studies, ordering and review of radiographic studies, pulse oximetry and re-evaluation of patient's condition   I assumed direction of critical care for this patient from another provider in my specialty: no       Virgina Norfolk, DO 10/25/22 2056

## 2022-10-26 ENCOUNTER — Inpatient Hospital Stay (HOSPITAL_COMMUNITY): Payer: Medicare Other

## 2022-10-26 DIAGNOSIS — B951 Streptococcus, group B, as the cause of diseases classified elsewhere: Secondary | ICD-10-CM

## 2022-10-26 DIAGNOSIS — J189 Pneumonia, unspecified organism: Secondary | ICD-10-CM | POA: Diagnosis not present

## 2022-10-26 DIAGNOSIS — I3131 Malignant pericardial effusion in diseases classified elsewhere: Secondary | ICD-10-CM

## 2022-10-26 DIAGNOSIS — R6520 Severe sepsis without septic shock: Secondary | ICD-10-CM | POA: Diagnosis not present

## 2022-10-26 DIAGNOSIS — N1831 Chronic kidney disease, stage 3a: Secondary | ICD-10-CM | POA: Diagnosis not present

## 2022-10-26 DIAGNOSIS — A401 Sepsis due to streptococcus, group B: Secondary | ICD-10-CM

## 2022-10-26 DIAGNOSIS — I480 Paroxysmal atrial fibrillation: Secondary | ICD-10-CM | POA: Diagnosis not present

## 2022-10-26 DIAGNOSIS — R079 Chest pain, unspecified: Secondary | ICD-10-CM | POA: Diagnosis not present

## 2022-10-26 DIAGNOSIS — A419 Sepsis, unspecified organism: Secondary | ICD-10-CM | POA: Diagnosis not present

## 2022-10-26 DIAGNOSIS — R601 Generalized edema: Secondary | ICD-10-CM

## 2022-10-26 LAB — BLOOD CULTURE ID PANEL (REFLEXED) - BCID2

## 2022-10-26 LAB — CBC
HCT: 36.9 % — ABNORMAL LOW (ref 39.0–52.0)
Hemoglobin: 11.8 g/dL — ABNORMAL LOW (ref 13.0–17.0)
MCH: 30.5 pg (ref 26.0–34.0)
MCHC: 32 g/dL (ref 30.0–36.0)
MCV: 95.3 fL (ref 80.0–100.0)
Platelets: 149 10*3/uL — ABNORMAL LOW (ref 150–400)
RBC: 3.87 MIL/uL — ABNORMAL LOW (ref 4.22–5.81)
RDW: 13.9 % (ref 11.5–15.5)
WBC: 21.7 10*3/uL — ABNORMAL HIGH (ref 4.0–10.5)
nRBC: 0 % (ref 0.0–0.2)

## 2022-10-26 LAB — COMPREHENSIVE METABOLIC PANEL WITH GFR
ALT: 13 U/L (ref 0–44)
AST: 25 U/L (ref 15–41)
Albumin: 2.3 g/dL — ABNORMAL LOW (ref 3.5–5.0)
Alkaline Phosphatase: 46 U/L (ref 38–126)
Anion gap: 13 (ref 5–15)
BUN: 21 mg/dL (ref 8–23)
CO2: 26 mmol/L (ref 22–32)
Calcium: 8.1 mg/dL — ABNORMAL LOW (ref 8.9–10.3)
Chloride: 101 mmol/L (ref 98–111)
Creatinine, Ser: 1.15 mg/dL (ref 0.61–1.24)
GFR, Estimated: >60 mL/min
Glucose, Bld: 119 mg/dL — ABNORMAL HIGH (ref 70–99)
Potassium: 4.1 mmol/L (ref 3.5–5.1)
Sodium: 140 mmol/L (ref 135–145)
Total Bilirubin: 1 mg/dL (ref 0.3–1.2)
Total Protein: 4.9 g/dL — ABNORMAL LOW (ref 6.5–8.1)

## 2022-10-26 LAB — LACTIC ACID, PLASMA: Lactic Acid, Venous: 1.8 mmol/L (ref 0.5–1.9)

## 2022-10-26 LAB — ECHOCARDIOGRAM COMPLETE
Area-P 1/2: 4.77 cm2
Height: 72 in
S' Lateral: 3.5 cm
Weight: 3710.4 [oz_av]

## 2022-10-26 LAB — GLUCOSE, CAPILLARY
Glucose-Capillary: 120 mg/dL — ABNORMAL HIGH (ref 70–99)
Glucose-Capillary: 145 mg/dL — ABNORMAL HIGH (ref 70–99)
Glucose-Capillary: 127 mg/dL — ABNORMAL HIGH (ref 70–99)

## 2022-10-26 LAB — PROCALCITONIN: Procalcitonin: 13.52 ng/mL

## 2022-10-26 LAB — HEMOGLOBIN A1C
Hgb A1c MFr Bld: 6 % — ABNORMAL HIGH (ref 4.8–5.6)
Mean Plasma Glucose: 125.5 mg/dL

## 2022-10-26 LAB — TROPONIN I (HIGH SENSITIVITY)
Troponin I (High Sensitivity): 292 ng/L (ref <18)
Troponin I (High Sensitivity): 350 ng/L (ref <18)

## 2022-10-26 LAB — MAGNESIUM: Magnesium: 1.5 mg/dL — ABNORMAL LOW (ref 1.7–2.4)

## 2022-10-26 LAB — CBG MONITORING, ED: Glucose-Capillary: 108 mg/dL — ABNORMAL HIGH (ref 70–99)

## 2022-10-26 MED ORDER — FUROSEMIDE 10 MG/ML IJ SOLN
40.0000 mg | Freq: Once | INTRAMUSCULAR | Status: AC
  Administered 2022-10-26: 40 mg via INTRAVENOUS
  Filled 2022-10-26: qty 4

## 2022-10-26 MED ORDER — MELATONIN 5 MG PO TABS
5.0000 mg | ORAL_TABLET | Freq: Every evening | ORAL | Status: AC | PRN
  Administered 2022-10-26 – 2022-10-30 (×2): 5 mg via ORAL
  Filled 2022-10-26 (×2): qty 1

## 2022-10-26 MED ORDER — ADULT MULTIVITAMIN W/MINERALS CH
1.0000 | ORAL_TABLET | Freq: Every day | ORAL | Status: DC
  Administered 2022-10-26 – 2022-11-03 (×9): 1 via ORAL
  Filled 2022-10-26 (×9): qty 1

## 2022-10-26 MED ORDER — FUROSEMIDE 10 MG/ML IJ SOLN
40.0000 mg | Freq: Two times a day (BID) | INTRAMUSCULAR | Status: DC
  Administered 2022-10-27: 40 mg via INTRAVENOUS
  Filled 2022-10-26: qty 4

## 2022-10-26 MED ORDER — ENSURE ENLIVE PO LIQD
237.0000 mL | Freq: Two times a day (BID) | ORAL | Status: DC
  Administered 2022-10-26 – 2022-11-02 (×12): 237 mL via ORAL

## 2022-10-26 MED ORDER — MAGNESIUM SULFATE 2 GM/50ML IV SOLN
2.0000 g | Freq: Once | INTRAVENOUS | Status: AC
  Administered 2022-10-26: 2 g via INTRAVENOUS
  Filled 2022-10-26: qty 50

## 2022-10-26 MED ORDER — SODIUM CHLORIDE 0.9 % IV SOLN: 2.0000 g | INTRAVENOUS | Status: DC

## 2022-10-26 MED ORDER — PENICILLIN G POTASSIUM 20000000 UNITS IJ SOLR
12.0000 10*6.[IU] | Freq: Two times a day (BID) | INTRAVENOUS | Status: DC
  Administered 2022-10-26 – 2022-11-03 (×16): 12 10*6.[IU] via INTRAVENOUS
  Filled 2022-10-26 (×17): qty 12

## 2022-10-26 NOTE — Progress Notes (Signed)
Initial Nutrition Assessment  DOCUMENTATION CODES:   Non-severe (moderate) malnutrition in context of chronic illness  INTERVENTION:  Multivitamin w/ minerals daily Ensure Enlive po BID, each supplement provides 350 kcal and 20 grams of protein. Liberalize diet to regular due to increased needs and poor PO at baseline.  Continue 2000 mL fluid restriction  NUTRITION DIAGNOSIS:   Moderate Malnutrition related to chronic illness as evidenced by mild fat depletion, mild muscle depletion.  GOAL:   Patient will meet greater than or equal to 90% of their needs  MONITOR:   PO intake, Supplement acceptance, Labs, I & O's  REASON FOR ASSESSMENT:   Consult Assessment of nutrition requirement/status  ASSESSMENT:   82 y.o. male presented to the ED with chest pain. PMH includes CKD IIIa, T2DM, CHF, A. Fib, CAD, and HTN. Pt admitted with severe sepsis likely secondary to pneumonia and generalize anasarca.   Pt sleeping at time of RD visit, woke to RD touch but easily falls back asleep. Reports that he typically only eats 1 meal per day, usually breakfast. Snacks some at home, but not often. Drinks oral nutrition supplements on occasionally. Denies any nausea or vomiting.  Reports a UBW of 198#, but has not been weighting regularly to know about weight loss. Says he has lymphedema and gains weight.  RD asked RN to obtain a new weight, current weight likely stated in the ED. Per Care everywhere, pt weight has continued to trend upwards since May; unsure if true weight gain vs fluid accumulation.   Medications reviewed and include: Doxycycline, NovoLog SSI, IV antibiotics, IV magnesium sulfate, LR at 75 mL/hr Labs reviewed: Sodium 140, Potassium 4.1, BUN 21, Creatinine 1.15, Magnesium 1.5, Hgb A1c 6.0  NUTRITION - FOCUSED PHYSICAL EXAM:  Flowsheet Row Most Recent Value  Orbital Region Mild depletion  Upper Arm Region No depletion  Thoracic and Lumbar Region No depletion  Buccal Region  Moderate depletion  Temple Region Moderate depletion  Clavicle Bone Region Mild depletion  Clavicle and Acromion Bone Region No depletion  Scapular Bone Region Mild depletion  Dorsal Hand No depletion  Patellar Region No depletion  Anterior Thigh Region No depletion  Posterior Calf Region No depletion  Edema (RD Assessment) Mild  Hair Reviewed  Eyes Reviewed  Mouth Reviewed  Skin Reviewed  Nails Reviewed   Diet Order:   Diet Order             Diet regular Room service appropriate? Yes with Assist; Fluid consistency: Thin; Fluid restriction: 2000 mL Fluid  Diet effective now                  EDUCATION NEEDS:  No education needs have been identified at this time  Skin:  Skin Assessment: Reviewed RN Assessment  Last BM:  Unknown  Height:  Ht Readings from Last 1 Encounters:  10/25/22 6' (1.829 m)   Weight:  Wt Readings from Last 1 Encounters:  10/26/22 105.2 kg   Ideal Body Weight:  80.9 kg  BMI:  Body mass index is 31.45 kg/m.  Estimated Nutritional Needs:  Kcal:  2150-2350 Protein:  110-130 grams Fluid:  >/= 2 L   Kirby Crigler RD, LDN Clinical Dietitian See [facility] for contact information.

## 2022-10-26 NOTE — ED Notes (Signed)
Hospitalist at bedside 

## 2022-10-26 NOTE — ED Notes (Signed)
Cardiology at bedside.

## 2022-10-26 NOTE — ED Notes (Signed)
Paged Dr Renford Dills [phone] to call RN Elexes at [phone].

## 2022-10-26 NOTE — Progress Notes (Addendum)
Rounding Note    Patient Name: Mason Hill Date of Encounter: 10/26/2022  [facility] Cardiologist: None   Subjective   Sitting up in bed, no complaints of chest pain or shortness of breath. Mostly abd pain/fullness and LE edema  => He has chronic lower extremity edema but now notes fullness and swelling/bloating in his stomach.  He does not have any active pain and has never had chest pain or dyspnea.  Inpatient Medications    Scheduled Meds:  apixaban  5 mg Oral BID   aspirin  324 mg Oral Once   aspirin EC  81 mg Oral Daily   atorvastatin  40 mg Oral Daily   doxycycline  100 mg Oral Q12H   insulin aspart  0-5 Units Subcutaneous QHS   insulin aspart  0-6 Units Subcutaneous TID WC   sodium chloride flush  3 mL Intravenous Q12H   Continuous Infusions:  sodium chloride     cefTRIAXone (ROCEPHIN)  IV     lactated ringers 75 mL/hr at 10/26/22 0920   magnesium sulfate bolus IVPB 50 mL/hr at 10/26/22 0920   PRN Meds: sodium chloride, acetaminophen **OR** acetaminophen, melatonin, ondansetron **OR** ondansetron (ZOFRAN) IV, sodium chloride flush   Vital Signs    Vitals:   10/26/22 0831 10/26/22 0834 10/26/22 0845 10/26/22 0922  BP: 130/77  (!) 140/64 (!) 142/63  Pulse:  78 (!) 111   Resp: (!) 28  (!) 21 (!) 26  Temp:    99.2 F (37.3 C)  TempSrc:    Oral  SpO2: 91%  93%   Weight:      Height:        Intake/Output Summary (Last 24 hours) at 10/26/2022 0930 Last data filed at 10/26/2022 0920 Gross per 24 hour  Intake 3528.72 ml  Output --  Net 3528.72 ml      10/25/2022    2:26 PM 07/17/2019    9:01 AM  Last 3 Weights  Weight (lbs) 200 lb 201 lb  Weight (kg) 90.719 kg 91.173 kg      Telemetry    Afib, rates variable 70-100s, PVCs PACs, NSVT - Personally Reviewed  Physical Exam   GEN: No acute distress.   Neck: + JVD Cardiac: Distant heart sounds.  Irreg Irreg, no murmurs, rubs, or gallops.  Respiratory: Diminished in bases with mild crackles  ( L>R) GI: Soft, nontender, fullness/ascites across abd -> she has extends up through the lower back/chest MS: 2+ LE edema (chronic lymphedema); No deformity. Neuro:  Nonfocal  Psych: Normal affect   Labs    High Sensitivity Troponin:   Recent Labs  Lab 10/25/22 1451 10/25/22 1719 10/26/22 0028 10/26/22 0233  TROPONINIHS 252* 256* 292* 350*     Chemistry Recent Labs  Lab 10/25/22 1451 10/26/22 0028  NA 141 140  K 3.3* 4.1  CL 106 101  CO2 27 26  GLUCOSE 105* 119*  BUN 18 21  CREATININE 1.16 1.15  CALCIUM 7.7* 8.1*  MG  --  1.5*  PROT 4.3* 4.9*  ALBUMIN 1.9* 2.3*  AST 25 25  ALT 13 13  ALKPHOS 46 46  BILITOT 1.4* 1.0  GFRNONAA >60 >60  ANIONGAP 8 13    Lipids No results for input(s): "CHOL", "TRIG", "HDL", "LABVLDL", "LDLCALC", "CHOLHDL" in the last 168 hours.  Hematology Recent Labs  Lab 10/25/22 1451 10/26/22 0028  WBC 16.4* 21.7*  RBC 3.69* 3.87*  HGB 11.2* 11.8*  HCT 35.0* 36.9*  MCV 94.9 95.3  MCH  30.4 30.5  MCHC 32.0 32.0  RDW 13.7 13.9  PLT 174 149*   Thyroid No results for input(s): "TSH", "FREET4" in the last 168 hours.  BNP Recent Labs  Lab 10/25/22 1500  BNP 684.8*    DDimer No results for input(s): "DDIMER" in the last 168 hours.   Radiology    CT CHEST ABDOMEN PELVIS W CONTRAST  Result Date: 10/25/2022 CLINICAL DATA:  Chest pain EXAM: CT CHEST, ABDOMEN, AND PELVIS WITH CONTRAST TECHNIQUE: Multidetector CT imaging of the chest, abdomen and pelvis was performed following the standard protocol during bolus administration of intravenous contrast. RADIATION DOSE REDUCTION: This exam was performed according to the departmental dose-optimization program which includes automated exposure control, adjustment of the mA and/or kV according to patient size and/or use of iterative reconstruction technique. CONTRAST:  75mL OMNIPAQUE IOHEXOL 350 MG/ML SOLN COMPARISON:  None Available. FINDINGS: Limited exam due to anasarca and streak artifact related  to patient arm position. CT CHEST FINDINGS Cardiovascular: Cardiomegaly. Moderate pericardial effusion. Caliber thoracic aorta with moderate atherosclerotic disease. Severe coronary artery calcifications. Mediastinum/Nodes: Esophagus and thyroid are unremarkable. No enlarged lymph nodes seen in the chest. Lungs/Pleura: Central airways are patent. Small left-greater-than-right pleural effusions and atelectasis. No pneumothorax. Musculoskeletal: No chest wall mass or suspicious bone lesions identified. CT ABDOMEN PELVIS FINDINGS Hepatobiliary: No focal liver abnormality is seen, although evaluation of the liver is greatly limited due to artifact. Gallbladder is grossly normal. No evidence of biliary ductal dilation. Pancreas: Unremarkable. Spleen: Normal in size without focal abnormality. Adrenals/Urinary Tract: Bilateral adrenal glands are unremarkable. No hydronephrosis. Nonobstructing left renal stone. Bilateral low-attenuation renal lesions which are likely simple cysts, artifact limits evaluation, no specific follow-up imaging is necessary. Bladder wall thickening. Stomach/Bowel: Stomach is within normal limits. No definite bowel wall thickening, although evaluation of the bowel loops is markedly limited. No evidence of obstruction. Vascular/Lymphatic: Aortic atherosclerosis. Mildly enlarged periaortic lymph node measuring 12 mm in short axis on series 3, image 75. Bilateral enlarged inguinal lymph nodes. Reference left inguinal lymph node measuring 1.6 mm in short axis on series 3, image 127. Reproductive: Prostate is unremarkable. Other: Diffuse body wall edema. Small fat containing periumbilical hernia. Diffuse mesenteric stranding and trace abdominopelvic ascites. Musculoskeletal: No acute or significant osseous findings. IMPRESSION: 1. Cardiomegaly and moderate pericardial effusion. 2. Small left-greater-than-right pleural effusions and atelectasis. 3. Evaluation of the abdomen and pelvis is markedly limited  due to streak artifact and diffuse anasarca. 4. Diffuse mesenteric stranding and trace abdominopelvic ascites, possibly related to volume status. 5. Bladder wall thickening, findings can be seen in the setting of cystitis. Correlate with urinalysis. 6. Enlarged periaortic and bilateral inguinal lymph nodes, possibly reactive. Consider follow-up CT of the abdomen and pelvis in 3 months to ensure resolution. 7. Severe coronary artery calcifications and aortic Atherosclerosis (ICD10-I70.0). Electronically Signed   By: Allegra Lai M.D.   On: 10/25/2022 18:41   DG Chest Port 1 View  Result Date: 10/25/2022 CLINICAL DATA:  Shortness of breath EXAM: PORTABLE CHEST 1 VIEW COMPARISON:  X-ray 04/14/2009 FINDINGS: Enlarged cardiopericardial silhouette. Calcified aorta. Prominent central vasculature. Tiny left effusion. Mild lung base opacities. Overlapping cardiac leads. IMPRESSION: Enlarged cardiopericardial silhouette. Tiny left effusion. Mild lung base opacities, left-greater-than-right. Subtle infiltrates possible. Recommend follow-up. Electronically Signed   By: Karen Kays M.D.   On: 10/25/2022 15:21    Cardiac Studies   Echo: pending  Patient Profile     82 y.o. male with a hx of paroxysmal atrial fibrillation, paroxysmal junctional  rhythm (declined PPM 2020), CAD , chronic diastolic heart failure, NSVT, hypertension, dyslipidemia, PAD required intervention, type 2 diabetes, who was seen 10/25/2022 for the evaluation of chest pain, elevated troponin, pericardial effusion at the request of Dr. Lockie Mola.   Assessment & Plan    Pericardial effusion -- CTa chest/abd/pelvis called moderate pericardial effusion. Bedside echo by MD last evening appeared mild to moderate, but difficult windows. VSS but systolic BP in the 120-130 range, HR is variable but not consistently tachycardic. Comfortable at the time of exam -- formal ECHO pending - He has had a history of a small pericardial effusion in the past,  it is possible with large pleural effusion that this is simply related to the ongoing process.  Certainly not having any signs or symptoms of tamponade.  HFpEF -- echo 05/2022 with normal LVEF, moderately RV dysfunction. BNP 684, CXR with enlarged cardio-pericardial silhouette, left pleural effusion -- notable abd ascites on exam, but denies any true CHF symptoms. LFTs ok -- initially was hypotensive requiring IVFs, but he is volume up on exam with JVD and diminished breath sounds -- IVFs stopped, will give IV lasix 40mg  x1 now and follow response => again, without any PND or orthopnea symptoms, it is difficult to explain the significant of edema and anasarca simply with diastolic heart failure-MORE LIKELY RV Failure as well as lymphedema.  Echo may or may not give better information about possible infiltrative conditions. -- repeat echo pending  Elevated troponin CAD (100% occluded small OM2 treated medically) -- denies any chest pain -- hsTn 252>>256>>292>>350, flat trend => suspect this is more demand ischemia in response to ongoing bacteremia and sepsis. -- echo pending as above, but low suspicion for ACS -> no indication for IV heparin, continue on DOAC -- continue ASA, does not appear he was on statin PTA  Sepsis Abd pain Cystitis  -- lactic 2.5>>3.5>>3.1>>1.8, procal 13.52 -- BC + strep species/agalactiae -- ID consulted per primary, suspect he will need TEE. Will attempt to arrange tentatively for next week -- antibiotics per primary  Paroxysmal Atrial fibrillation PVCs/NSVT  Junctional Rhythm -- rates are variable in the ED, but stable -- currently on Eliquis 5mg  BID -- has declined a PPM in the past       Signed, Laverda Page, NP  10/26/2022, 9:30 AM     ATTENDING ATTESTATION  I have seen, examined and evaluated the patient this morning on rounds along with Laverda Page, NP.  After reviewing all the available data and chart, we discussed the patients laboratory,  study & physical findings as well as symptoms in detail.  I agree with her findings, examination as well as impression recommendations as per our discussion.    Attending adjustments noted in italics.   Very difficult situation where it is very hard to figure out what exactly is going on.  He has gram-positive bacteremia in the setting of longstanding lymphedema but now appears to have anasarca.  Does not seem to have left-sided heart failure symptoms although cannot exclude right-sided heart failure.  Bedside cart not very revealing.  Pending formal 2D echo.  Depending on blood culture findings and ID recommendations that he may or may not require TEE.  We will be on standby as the next available option it would be next Friday so there is time to make the decision.  Will follow along, seems to be stable from a cardiac standpoint at this point although he does definitely require diuresis.  Follow renal function and electrolytes.  He has significant medical ectopy and therefore could be susceptible to electrolyte abnormalities   ADDENDUM: Echocardiogram reviewed, normal LV function but severely reduced RV function with dilated RV and RA and elevated RAP this is consistent with RV failure and very well likely the cause of his anasarca.  He diuresed almost a liter with 1 dose of 40 mg IV Lasix, since he was just admitted and hypotensive receiving fluids I will hold off on additional dose today but would restart 40 mg IV twice daily tomorrow.  Would need to determine how well he does with IV diuresis and he may potentially benefit from milrinone or dobutamine depending on how well he diuresis.  I will allow that we continue to assess and determine if any additional therapy is needed.  Would recommend that he probably would best served on one of the cardiology floors for close monitoring.     Marykay Lex, MD, MS Bryan Lemma, M.D., M.S. Interventional Cardiologist  [facility]  Pager  # [phone] Phone # [phone] [address] [place], [place] [address]  For questions or updates, please contact [facility] Please consult [email or web address] for contact info under

## 2022-10-26 NOTE — Progress Notes (Signed)
PROGRESS NOTE  Anchor Huisinga  WUJ:[id number] DOB: 09-09-1940 DOA: 10/25/2022 PCP: Nonda Lou, MD   Brief Narrative: Patient is a 82 year old male with history of hypertension, CKD stage III, hyperlipidemia, diabetes type 2, chronic diastolic CHF, paroxysmal A-fib, coronary artery disease, hypertension, hyperlipidemia, peripheral artery disease require intervention who initially presented with midepigastric pain, chest pain, abdominal pain, dry cough, progressive worsening of bilateral lower extremity swelling, dyspnea, orthopnea.  On presentation, blood pressure was soft, had mild fever but saturating fine on room air.  Respiratory viral panel came out of negative.  Lab work showed lactic acid of 2.5, potassium of 3.3, creatinine of 1.6, WBC count of 16.4, elevated BNP of 664, elevated troponin.  EKG showed A-fib.  Chest x-ray showed enlarged cardiac shadow, mid lung base opacity left greater than right.  CT abdomen/pelvis showed cardiomegaly, moderate pericardial effusion.  Patient was admitted for the management of possible sepsis secondary to pneumonia.  Cardiology ,ID also consulted.  Assessment & Plan:  Principal Problem:   Severe sepsis (HCC) Active Problems:   CAP (community acquired pneumonia)   Pericardial effusion   Elevated troponin   Essential hypertension   Chronic kidney disease (CKD), stage III (moderate) (HCC)   Paroxysmal atrial fibrillation (HCC)   DM (diabetes mellitus) type II controlled with renal manifestation (HCC)   Chronic systolic CHF (congestive heart failure) (HCC)   History of coronary artery disease - small OM occlusion; med Rx   Hypokalemia  Severe sepsis  : Presented with weakness, dry cough, chest pain/abdominal pain, dyspnea.    Lab work showed leukocytosis, elevated lactate.  Blood pressure was soft and he was mildly febrile on presentation.  Met criteria for sepsis.   Culture sent.  Currently on ceftriaxone, doxycycline. Blood cultures now  showing streptococcal agalactiae on both aerobic and anaerobic bottles.  Will get ID consultation.  Procalcitonin is elevated.  Leukocytosis worsened today.  Afebrile this morning  Community-acquired pneumonia:Chest x-ray showed possible bilateral pneumonia left greater than right.  Respiratory viral panel negative.  Currently on room air.  Enlarged periaortic/bilateral inguinal lymph nodes: Likely reactive.  Recommend follow-up CT abdomen/pelvis in 3 months  Anasarca: Has low albumin.  Found to have severe anasarca from abdomen to lower extremities.  Given albumin.  Pericardial effusion: This could be the part of anasarca.  CT chest showed cardiomegaly, moderate pericardial effusion.  Cardiology following.  Bedside echo did not show any evidence of tamponade.  Echo pending  Coronary artery disease/elevated troponin: Elevated with flat trend.  Complaint of chest pain on admission which has resolved.  Currently chest pain-free.  EKG showed A-fib, PVCs, minimal ST depression in the inferior leads.  Elevated troponin could be from demand ischemia.  Echocardiogram ordered.  Takes Lipitor at home.  Cardiology following  Paroxysmal A-fib: On Eliquis for anticoagulation, cardiology following.  Monitor on telemetry.  Currently in normal sinus rhythm  Chronic diastolic CHF with exacerbation: Takes amlodipine, losartan, Jardiance at home.  Presented with anasarca, elevated BNP.  Chest x-ray showing cardiomegaly.  Given a dose of IV Lasix.  Echo showed  Hypertension: Currently antihypertensives on hold.  On losartan, spironolactone, Jardiance, Lasix at home.  Hypokalemia/hypomagnesemia: Currently being monitored and supplemented as needed.  Diabetes type 2: A1c of 6.  Jardiance at home.  Currently on sliding scale  Debility/deconditioning: Patient lives with his grandson.  Walks fine at home.  Will consult PT/OT when appropriate        DVT prophylaxis:SCDs Start: 10/25/22 1934 apixaban (ELIQUIS)  tablet 5  mg     Code Status: Full Code  Family Communication: Called and discussed with daughter Lytle Butte on phone on 8/30  Patient status:Inpatient  Patient is from :home  Anticipated discharge to: Home versus SNF  Estimated DC date: Not sure   Consultants: Cardiology  Procedures: None  Antimicrobials:  Anti-infectives (From admission, onward)    Start     Dose/Rate Route Frequency Ordered Stop   10/26/22 1600  cefTRIAXone (ROCEPHIN) 2 g in sodium chloride 0.9 % 100 mL IVPB        2 g 200 mL/hr over 30 Minutes Intravenous Every 24 hours 10/26/22 0538     10/26/22 1500  cefTRIAXone (ROCEPHIN) 1 g in sodium chloride 0.9 % 100 mL IVPB  Status:  Discontinued        1 g 200 mL/hr over 30 Minutes Intravenous Every 24 hours 10/25/22 1933 10/26/22 0538   10/25/22 2200  doxycycline (VIBRA-TABS) tablet 100 mg        100 mg Oral Every 12 hours 10/25/22 1933     10/25/22 1945  cefTRIAXone (ROCEPHIN) 1 g in sodium chloride 0.9 % 100 mL IVPB  Status:  Discontinued        1 g 200 mL/hr over 30 Minutes Intravenous Every 24 hours 10/25/22 1933 10/25/22 1933   10/25/22 1800  piperacillin-tazobactam (ZOSYN) IVPB 3.375 g        3.375 g 100 mL/hr over 30 Minutes Intravenous  Once 10/25/22 1749 10/25/22 1957   10/25/22 1530  cefTRIAXone (ROCEPHIN) 2 g in sodium chloride 0.9 % 100 mL IVPB        2 g 200 mL/hr over 30 Minutes Intravenous  Once 10/25/22 1529 10/25/22 1646   10/25/22 1530  azithromycin (ZITHROMAX) 500 mg in sodium chloride 0.9 % 250 mL IVPB        500 mg 250 mL/hr over 60 Minutes Intravenous  Once 10/25/22 1529 10/25/22 1618       Subjective: Patient seen and examined at bedside today.  During my evaluation, he looked overall comfortable.  Cardiology team was also on the bedside.  He looks severely volume overloaded with bilateral lower extremity edema.  He was on room air.  Denies any shortness of breath, chest pain or cough during my evaluation.  Abdominal pain also has  resolved.  He is afebrile.  Objective: Vitals:   10/26/22 0400 10/26/22 0615 10/26/22 0645 10/26/22 0745  BP:  112/64 (!) 128/50 (!) 134/54  Pulse:  77 77 98  Resp:  19 20 17   Temp: 99.5 F (37.5 C)     TempSrc: Oral     SpO2:  90% 92% 92%  Weight:      Height:        Intake/Output Summary (Last 24 hours) at 10/26/2022 0757 Last data filed at 10/26/2022 0002 Gross per 24 hour  Intake 2475.31 ml  Output --  Net 2475.31 ml   Filed Weights   10/25/22 1426  Weight: 90.7 kg    Examination:  General exam: Overall comfortable, not in distress, pleasant elderly gentleman HEENT: PERRL Respiratory system: Bilateral  crackles on bases Cardiovascular system: S1 & S2 heard, RRR.  Gastrointestinal system: Abdomen is distended, soft and nontender. Central nervous system: Alert and oriented Extremities: Bilateral lower extremity pitting edema, no clubbing ,no cyanosis Skin: No rashes, no ulcers,no icterus     Data Reviewed: I have personally reviewed following labs and imaging studies  CBC: Recent Labs  Lab 10/25/22 1451 10/26/22 0028  WBC 16.4*  21.7*  NEUTROABS 14.3*  --   HGB 11.2* 11.8*  HCT 35.0* 36.9*  MCV 94.9 95.3  PLT 174 149*   Basic Metabolic Panel: Recent Labs  Lab 10/25/22 1451 10/26/22 0028  NA 141 140  K 3.3* 4.1  CL 106 101  CO2 27 26  GLUCOSE 105* 119*  BUN 18 21  CREATININE 1.16 1.15  CALCIUM 7.7* 8.1*  MG  --  1.5*     Recent Results (from the past 240 hour(s))  Culture, blood (Routine x 2)     Status: None (Preliminary result)   Collection Time: 10/25/22  2:23 PM   Specimen: BLOOD RIGHT ARM  Result Value Ref Range Status   Specimen Description BLOOD RIGHT ARM  Final   Special Requests   Final    BOTTLES DRAWN AEROBIC AND ANAEROBIC Blood Culture results may not be optimal due to an excessive volume of blood received in culture bottles   Culture  Setup Time   Final    GRAM POSITIVE COCCI IN BOTH AEROBIC AND ANAEROBIC BOTTLES CRITICAL  VALUE NOTED.  VALUE IS CONSISTENT WITH PREVIOUSLY REPORTED AND CALLED VALUE. Performed at [facility], [address] [place], [place] [address]    Culture GRAM POSITIVE COCCI  Final   Report Status PENDING  Incomplete  SARS Coronavirus 2 by RT PCR (hospital order, performed in [facility] hospital lab) *cepheid single result test* Anterior Nasal Swab     Status: None   Collection Time: 10/25/22  2:30 PM   Specimen: Anterior Nasal Swab  Result Value Ref Range Status   SARS Coronavirus 2 by RT PCR NEGATIVE NEGATIVE Final    Comment: Performed at [facility], [address] [place], [place] [address]  Culture, blood (Routine x 2)     Status: None (Preliminary result)   Collection Time: 10/25/22  2:50 PM   Specimen: BLOOD RIGHT HAND  Result Value Ref Range Status   Specimen Description BLOOD RIGHT HAND  Final   Special Requests   Final    BOTTLES DRAWN AEROBIC AND ANAEROBIC Blood Culture adequate volume   Culture  Setup Time   Final    GRAM POSITIVE COCCI ANAEROBIC BOTTLE ONLY Organism ID to follow CRITICAL RESULT CALLED TO, READ BACK BY AND VERIFIED WITH: [facility]  10/26/22 MK Performed at [facility], [address] [place], [place] [address]    Culture GRAM POSITIVE COCCI  Final   Report Status PENDING  Incomplete  Blood Culture ID Panel (Reflexed)     Status: Abnormal   Collection Time: 10/25/22  2:50 PM  Result Value Ref Range Status   Enterococcus faecalis NOT DETECTED NOT DETECTED Final   Enterococcus Faecium NOT DETECTED NOT DETECTED Final   Listeria monocytogenes NOT DETECTED NOT DETECTED Final   Staphylococcus species NOT DETECTED NOT DETECTED Final   Staphylococcus aureus (BCID) NOT DETECTED NOT DETECTED Final   Staphylococcus epidermidis NOT DETECTED NOT DETECTED Final   Staphylococcus lugdunensis NOT DETECTED NOT DETECTED Final   Streptococcus species DETECTED (A) NOT DETECTED Final    Comment: CRITICAL RESULT CALLED TO, READ BACK BY  AND VERIFIED WITH: J [facility]  10/26/22 MK    Streptococcus agalactiae DETECTED (A) NOT DETECTED Final    Comment: CRITICAL RESULT CALLED TO, READ BACK BY AND VERIFIED WITH: J WYLAND,[email or web address]  10/26/22 MK    Streptococcus pneumoniae NOT DETECTED NOT DETECTED Final   Streptococcus pyogenes NOT DETECTED NOT DETECTED Final   A.calcoaceticus-baumannii NOT DETECTED NOT DETECTED  Final   Bacteroides fragilis NOT DETECTED NOT DETECTED Final   Enterobacterales NOT DETECTED NOT DETECTED Final   Enterobacter cloacae complex NOT DETECTED NOT DETECTED Final   Escherichia coli NOT DETECTED NOT DETECTED Final   Klebsiella aerogenes NOT DETECTED NOT DETECTED Final   Klebsiella oxytoca NOT DETECTED NOT DETECTED Final   Klebsiella pneumoniae NOT DETECTED NOT DETECTED Final   Proteus species NOT DETECTED NOT DETECTED Final   Salmonella species NOT DETECTED NOT DETECTED Final   Serratia marcescens NOT DETECTED NOT DETECTED Final   Haemophilus influenzae NOT DETECTED NOT DETECTED Final   Neisseria meningitidis NOT DETECTED NOT DETECTED Final   Pseudomonas aeruginosa NOT DETECTED NOT DETECTED Final   Stenotrophomonas maltophilia NOT DETECTED NOT DETECTED Final   Candida albicans NOT DETECTED NOT DETECTED Final   Candida auris NOT DETECTED NOT DETECTED Final   Candida glabrata NOT DETECTED NOT DETECTED Final   Candida krusei NOT DETECTED NOT DETECTED Final   Candida parapsilosis NOT DETECTED NOT DETECTED Final   Candida tropicalis NOT DETECTED NOT DETECTED Final   Cryptococcus neoformans/gattii NOT DETECTED NOT DETECTED Final    Comment: Performed at [facility], [address] [place], [place] [address]     Radiology Studies: CT CHEST ABDOMEN PELVIS W CONTRAST  Result Date: 10/25/2022 CLINICAL DATA:  Chest pain EXAM: CT CHEST, ABDOMEN, AND PELVIS WITH CONTRAST TECHNIQUE: Multidetector CT imaging of the chest, abdomen and pelvis was performed following the standard protocol during  bolus administration of intravenous contrast. RADIATION DOSE REDUCTION: This exam was performed according to the departmental dose-optimization program which includes automated exposure control, adjustment of the mA and/or kV according to patient size and/or use of iterative reconstruction technique. CONTRAST:  75mL OMNIPAQUE IOHEXOL 350 MG/ML SOLN COMPARISON:  None Available. FINDINGS: Limited exam due to anasarca and streak artifact related to patient arm position. CT CHEST FINDINGS Cardiovascular: Cardiomegaly. Moderate pericardial effusion. Caliber thoracic aorta with moderate atherosclerotic disease. Severe coronary artery calcifications. Mediastinum/Nodes: Esophagus and thyroid are unremarkable. No enlarged lymph nodes seen in the chest. Lungs/Pleura: Central airways are patent. Small left-greater-than-right pleural effusions and atelectasis. No pneumothorax. Musculoskeletal: No chest wall mass or suspicious bone lesions identified. CT ABDOMEN PELVIS FINDINGS Hepatobiliary: No focal liver abnormality is seen, although evaluation of the liver is greatly limited due to artifact. Gallbladder is grossly normal. No evidence of biliary ductal dilation. Pancreas: Unremarkable. Spleen: Normal in size without focal abnormality. Adrenals/Urinary Tract: Bilateral adrenal glands are unremarkable. No hydronephrosis. Nonobstructing left renal stone. Bilateral low-attenuation renal lesions which are likely simple cysts, artifact limits evaluation, no specific follow-up imaging is necessary. Bladder wall thickening. Stomach/Bowel: Stomach is within normal limits. No definite bowel wall thickening, although evaluation of the bowel loops is markedly limited. No evidence of obstruction. Vascular/Lymphatic: Aortic atherosclerosis. Mildly enlarged periaortic lymph node measuring 12 mm in short axis on series 3, image 75. Bilateral enlarged inguinal lymph nodes. Reference left inguinal lymph node measuring 1.6 mm in short axis on  series 3, image 127. Reproductive: Prostate is unremarkable. Other: Diffuse body wall edema. Small fat containing periumbilical hernia. Diffuse mesenteric stranding and trace abdominopelvic ascites. Musculoskeletal: No acute or significant osseous findings. IMPRESSION: 1. Cardiomegaly and moderate pericardial effusion. 2. Small left-greater-than-right pleural effusions and atelectasis. 3. Evaluation of the abdomen and pelvis is markedly limited due to streak artifact and diffuse anasarca. 4. Diffuse mesenteric stranding and trace abdominopelvic ascites, possibly related to volume status. 5. Bladder wall thickening, findings can be seen in the setting of cystitis.  Correlate with urinalysis. 6. Enlarged periaortic and bilateral inguinal lymph nodes, possibly reactive. Consider follow-up CT of the abdomen and pelvis in 3 months to ensure resolution. 7. Severe coronary artery calcifications and aortic Atherosclerosis (ICD10-I70.0). Electronically Signed   By: Allegra Lai M.D.   On: 10/25/2022 18:41   DG Chest Port 1 View  Result Date: 10/25/2022 CLINICAL DATA:  Shortness of breath EXAM: PORTABLE CHEST 1 VIEW COMPARISON:  X-ray 04/14/2009 FINDINGS: Enlarged cardiopericardial silhouette. Calcified aorta. Prominent central vasculature. Tiny left effusion. Mild lung base opacities. Overlapping cardiac leads. IMPRESSION: Enlarged cardiopericardial silhouette. Tiny left effusion. Mild lung base opacities, left-greater-than-right. Subtle infiltrates possible. Recommend follow-up. Electronically Signed   By: Karen Kays M.D.   On: 10/25/2022 15:21    Scheduled Meds:  apixaban  5 mg Oral BID   aspirin  324 mg Oral Once   aspirin EC  81 mg Oral Daily   atorvastatin  40 mg Oral Daily   doxycycline  100 mg Oral Q12H   insulin aspart  0-5 Units Subcutaneous QHS   insulin aspart  0-6 Units Subcutaneous TID WC   sodium chloride flush  3 mL Intravenous Q12H   Continuous Infusions:  sodium chloride      cefTRIAXone (ROCEPHIN)  IV     lactated ringers 75 mL/hr at 10/25/22 1946     LOS: 1 day   Burnadette Pop, MD [facility] P8/30/2024, 7:57 AM

## 2022-10-26 NOTE — ED Notes (Signed)
Notified RN of monitor alerts. ?

## 2022-10-26 NOTE — ED Notes (Signed)
Notified Dr. Willia Hill that patient's heart rate went from 115 BPM to 50 BPM to 30 BPM. Pt denied any pain or sob at this time.

## 2022-10-26 NOTE — Progress Notes (Signed)
  Echocardiogram 2D Echocardiogram has been performed.  Leda Roys RDCS 10/26/2022, 1:58 PM

## 2022-10-26 NOTE — Progress Notes (Signed)
PHARMACY - PHYSICIAN COMMUNICATION CRITICAL VALUE ALERT - BLOOD CULTURE IDENTIFICATION (BCID)  Mason Hill is an 82 y.o. male who presented to [facility] on 10/25/2022 with a chief complaint of midepigastric pain and chest pain. Patient Bcx 2/4 with strep agalactiae no gene R in both anaerobe bottles. Tmax 100.6 > 99.5, WBC16.4 >21.7, sCr 1.15 (bl~1)  Name of physician (or Provider) Contacted: Dr. Janalyn Shy  Current antibiotics: Ceftriaxone 1g IV every 24h, doxycycline 100mg  PO every 12 hours  Changes to prescribed antibiotics recommended:    -Antibiotic regimen changed to Ceftriaxone 2g IV every 24 hours per MD   Results for orders placed or performed during the hospital encounter of 10/25/22  Blood Culture ID Panel (Reflexed) (Collected: 10/25/2022  2:50 PM)  Result Value Ref Range   Enterococcus faecalis NOT DETECTED NOT DETECTED   Enterococcus Faecium NOT DETECTED NOT DETECTED   Listeria monocytogenes NOT DETECTED NOT DETECTED   Staphylococcus species NOT DETECTED NOT DETECTED   Staphylococcus aureus (BCID) NOT DETECTED NOT DETECTED   Staphylococcus epidermidis NOT DETECTED NOT DETECTED   Staphylococcus lugdunensis NOT DETECTED NOT DETECTED   Streptococcus species DETECTED (A) NOT DETECTED   Streptococcus agalactiae DETECTED (A) NOT DETECTED   Streptococcus pneumoniae NOT DETECTED NOT DETECTED   Streptococcus pyogenes NOT DETECTED NOT DETECTED   A.calcoaceticus-baumannii NOT DETECTED NOT DETECTED   Bacteroides fragilis NOT DETECTED NOT DETECTED   Enterobacterales NOT DETECTED NOT DETECTED   Enterobacter cloacae complex NOT DETECTED NOT DETECTED   Escherichia coli NOT DETECTED NOT DETECTED   Klebsiella aerogenes NOT DETECTED NOT DETECTED   Klebsiella oxytoca NOT DETECTED NOT DETECTED   Klebsiella pneumoniae NOT DETECTED NOT DETECTED   Proteus species NOT DETECTED NOT DETECTED   Salmonella species NOT DETECTED NOT DETECTED   Serratia marcescens NOT DETECTED NOT DETECTED    Haemophilus influenzae NOT DETECTED NOT DETECTED   Neisseria meningitidis NOT DETECTED NOT DETECTED   Pseudomonas aeruginosa NOT DETECTED NOT DETECTED   Stenotrophomonas maltophilia NOT DETECTED NOT DETECTED   Candida albicans NOT DETECTED NOT DETECTED   Candida auris NOT DETECTED NOT DETECTED   Candida glabrata NOT DETECTED NOT DETECTED   Candida krusei NOT DETECTED NOT DETECTED   Candida parapsilosis NOT DETECTED NOT DETECTED   Candida tropicalis NOT DETECTED NOT DETECTED   Cryptococcus neoformans/gattii NOT DETECTED NOT DETECTED    Arabella Merles, PharmD. Clinical Pharmacist 10/26/2022 5:35 AM

## 2022-10-26 NOTE — Progress Notes (Addendum)
Micro lab informed and informed pharmacy that patient's blood culture growing Streptococcus agalactiae. -Increasing ceftriaxone 1 g to 2 g IV daily covering for Streptococcus agalactiae associated bacteremia.   Tereasa Coop, MD [facility] 10/26/2022, 5:38 AM

## 2022-10-26 NOTE — ED Notes (Signed)
ED TO INPATIENT HANDOFF REPORT  ED Nurse Name and Phone #: [phone]  S Name/Age/Gender Mason Hill 82 y.o. male Room/Bed: 016C/016C  Code Status   Code Status: Full Code  Home/SNF/Other Home Patient oriented to: self, place, time, and situation Is this baseline? Yes   Triage Complete: Triage complete  Chief Complaint Severe sepsis (HCC) [A41.9, R65.20]  Triage Note Pt BIB [facility] from home. Pt with sudden onset of substernal CP without radiation. Denies ShOB, nausea, or diaphoresis. EMS admin 324 mg of ASA, 4L O2, and 1000 mL of NaCl. Pt with hx of AMI and a-fib.   EMS Vitals   82/40 A-fib @ 130 Temp 100.4 RR 24 SpO2 88% on R/A.    Allergies Allergies  Allergen Reactions   Codeine Other (See Comments) and Rash    Other Reaction: BLISTERING, PEELING.   Erythromycin    Isosorbide Nitrate     Other reaction(s): Headache    Level of Care/Admitting Diagnosis ED Disposition     ED Disposition  Admit   Condition  --   Comment  Hospital Area: [facility] [[id number]]  Level of Care: Progressive [102]  Admit to Progressive based on following criteria: Other see comments  Comments: Severe sepsis  May admit patient to [facility] or [facility] if equivalent level of care is available:: No  Covid Evaluation: Asymptomatic - no recent exposure (last 10 days) testing not required  Diagnosis: Severe sepsis [facility]) [[id number]]  Admitting Physician: Tereasa Coop [[id number]]  Attending Physician: Tereasa Coop [[id number]]  Certification:: I certify this patient will need inpatient services for at least 2 midnights  Expected Medical Readiness: 11/01/2022          B Medical/Surgery History Past Medical History:  Diagnosis Date   A-fib [facility])    AMI (acute myocardial infarction) (HCC)    Diabetes mellitus without complication (HCC)    Hypertension    Past Surgical History:  Procedure Laterality Date   HERNIA REPAIR       A IV  Location/Drains/Wounds Patient Lines/Drains/Airways Status     Active Line/Drains/Airways     Name Placement date Placement time Site Days   Peripheral IV 10/25/22 18 G Left Antecubital 10/25/22  1513  Antecubital  1   Peripheral IV 10/25/22 20 G 1" Anterior;Right Forearm 10/25/22  1446  Forearm  1            Intake/Output Last 24 hours  Intake/Output Summary (Last 24 hours) at 10/26/2022 0835 Last data filed at 10/26/2022 0002 Gross per 24 hour  Intake 2475.31 ml  Output --  Net 2475.31 ml    Labs/Imaging Results for orders placed or performed during the hospital encounter of 10/25/22 (from the past 48 hour(s))  Culture, blood (Routine x 2)     Status: None (Preliminary result)   Collection Time: 10/25/22  2:23 PM   Specimen: BLOOD RIGHT ARM  Result Value Ref Range   Specimen Description BLOOD RIGHT ARM    Special Requests      BOTTLES DRAWN AEROBIC AND ANAEROBIC Blood Culture results may not be optimal due to an excessive volume of blood received in culture bottles   Culture  Setup Time      GRAM POSITIVE COCCI IN BOTH AEROBIC AND ANAEROBIC BOTTLES CRITICAL VALUE NOTED.  VALUE IS CONSISTENT WITH PREVIOUSLY REPORTED AND CALLED VALUE. Performed at [facility], [address] [place], [place] [address]    Culture GRAM POSITIVE COCCI    Report Status PENDING  SARS Coronavirus 2 by RT PCR (hospital order, performed in [facility] hospital lab) *cepheid single result test* Anterior Nasal Swab     Status: None   Collection Time: 10/25/22  2:30 PM   Specimen: Anterior Nasal Swab  Result Value Ref Range   SARS Coronavirus 2 by RT PCR NEGATIVE NEGATIVE    Comment: Performed at [facility] Lab, [address] [place], [place] [address]  Culture, blood (Routine x 2)     Status: None (Preliminary result)   Collection Time: 10/25/22  2:50 PM   Specimen: BLOOD RIGHT HAND  Result Value Ref Range   Specimen Description BLOOD RIGHT HAND    Special Requests       BOTTLES DRAWN AEROBIC AND ANAEROBIC Blood Culture adequate volume   Culture  Setup Time      GRAM POSITIVE COCCI ANAEROBIC BOTTLE ONLY Organism ID to follow CRITICAL RESULT CALLED TO, READ BACK BY AND VERIFIED WITH: [facility]  10/26/22 MK Performed at [facility] Lab, [address] [place], [place] [address]    Culture GRAM POSITIVE COCCI    Report Status PENDING   Blood Culture ID Panel (Reflexed)     Status: Abnormal   Collection Time: 10/25/22  2:50 PM  Result Value Ref Range   Enterococcus faecalis NOT DETECTED NOT DETECTED   Enterococcus Faecium NOT DETECTED NOT DETECTED   Listeria monocytogenes NOT DETECTED NOT DETECTED   Staphylococcus species NOT DETECTED NOT DETECTED   Staphylococcus aureus (BCID) NOT DETECTED NOT DETECTED   Staphylococcus epidermidis NOT DETECTED NOT DETECTED   Staphylococcus lugdunensis NOT DETECTED NOT DETECTED   Streptococcus species DETECTED (A) NOT DETECTED    Comment: CRITICAL RESULT CALLED TO, READ BACK BY AND VERIFIED WITH: J WYLAND,PHARMD@0525  10/26/22 MK    Streptococcus agalactiae DETECTED (A) NOT DETECTED    Comment: CRITICAL RESULT CALLED TO, READ BACK BY AND VERIFIED WITH: J WYLAND,PHARMD@0525  10/26/22 MK    Streptococcus pneumoniae NOT DETECTED NOT DETECTED   Streptococcus pyogenes NOT DETECTED NOT DETECTED   A.calcoaceticus-baumannii NOT DETECTED NOT DETECTED   Bacteroides fragilis NOT DETECTED NOT DETECTED   Enterobacterales NOT DETECTED NOT DETECTED   Enterobacter cloacae complex NOT DETECTED NOT DETECTED   Escherichia coli NOT DETECTED NOT DETECTED   Klebsiella aerogenes NOT DETECTED NOT DETECTED   Klebsiella oxytoca NOT DETECTED NOT DETECTED   Klebsiella pneumoniae NOT DETECTED NOT DETECTED   Proteus species NOT DETECTED NOT DETECTED   Salmonella species NOT DETECTED NOT DETECTED   Serratia marcescens NOT DETECTED NOT DETECTED   Haemophilus influenzae NOT DETECTED NOT DETECTED   Neisseria meningitidis NOT DETECTED  NOT DETECTED   Pseudomonas aeruginosa NOT DETECTED NOT DETECTED   Stenotrophomonas maltophilia NOT DETECTED NOT DETECTED   Candida albicans NOT DETECTED NOT DETECTED   Candida auris NOT DETECTED NOT DETECTED   Candida glabrata NOT DETECTED NOT DETECTED   Candida krusei NOT DETECTED NOT DETECTED   Candida parapsilosis NOT DETECTED NOT DETECTED   Candida tropicalis NOT DETECTED NOT DETECTED   Cryptococcus neoformans/gattii NOT DETECTED NOT DETECTED    Comment: Performed at [facility] Lab, [address] [place], [place] [address]  Comprehensive metabolic panel     Status: Abnormal   Collection Time: 10/25/22  2:51 PM  Result Value Ref Range   Sodium 141 135 - 145 mmol/L   Potassium 3.3 (L) 3.5 - 5.1 mmol/L   Chloride 106 98 - 111 mmol/L   CO2 27 22 - 32 mmol/L   Glucose, Bld  105 (H) 70 - 99 mg/dL    Comment: Glucose reference range applies only to samples taken after fasting for at least 8 hours.   BUN 18 8 - 23 mg/dL   Creatinine, Ser 8.11 0.61 - 1.24 mg/dL   Calcium 7.7 (L) 8.9 - 10.3 mg/dL   Total Protein 4.3 (L) 6.5 - 8.1 g/dL   Albumin 1.9 (L) 3.5 - 5.0 g/dL   AST 25 15 - 41 U/L   ALT 13 0 - 44 U/L   Alkaline Phosphatase 46 38 - 126 U/L   Total Bilirubin 1.4 (H) 0.3 - 1.2 mg/dL   GFR, Estimated >91 >47 mL/min    Comment: (NOTE) Calculated using the CKD-EPI Creatinine Equation (2021)    Anion gap 8 5 - 15    Comment: Performed at [facility], [address], [place], [place] [address]  Protime-INR     Status: Abnormal   Collection Time: 10/25/22  2:51 PM  Result Value Ref Range   Prothrombin Time 15.7 (H) 11.4 - 15.2 seconds   INR 1.2 0.8 - 1.2    Comment: (NOTE) INR goal varies based on device and disease states. Performed at [facility], [address] [place], [place] [address]   Troponin I (High Sensitivity)     Status: Abnormal   Collection Time: 10/25/22  2:51 PM  Result Value Ref Range   Troponin I (High Sensitivity) 252 (HH) <18 ng/L     Comment: TRIED TO CALL AT 1547 NO ANSWER 2ND ATTEMPT CALL AT 1559 NO ANSWER CRITICAL RESULT CALLED TO, READ BACK BY AND VERIFIED WITH A BROWN RN 10/25/2022 1603 BNUNNERY (NOTE) Elevated high sensitivity troponin I (hsTnI) values and significant  changes across serial measurements may suggest ACS but many other  chronic and acute conditions are known to elevate hsTnI results.  Refer to the Links section for chest pain algorithms and additional  guidance. Performed at [facility] Lab, [address], [place], [place] [address]   CBC with Differential/Platelet     Status: Abnormal   Collection Time: 10/25/22  2:51 PM  Result Value Ref Range   WBC 16.4 (H) 4.0 - 10.5 K/uL   RBC 3.69 (L) 4.22 - 5.81 MIL/uL   Hemoglobin 11.2 (L) 13.0 - 17.0 g/dL   HCT 69.6 (L) 29.5 - 28.4 %   MCV 94.9 80.0 - 100.0 fL   MCH 30.4 26.0 - 34.0 pg   MCHC 32.0 30.0 - 36.0 g/dL   RDW 13.2 44.0 - 10.2 %   Platelets 174 150 - 400 K/uL   nRBC 0.0 0.0 - 0.2 %   Neutrophils Relative % 87 %   Neutro Abs 14.3 (H) 1.7 - 7.7 K/uL   Lymphocytes Relative 2 %   Lymphs Abs 0.3 (L) 0.7 - 4.0 K/uL   Monocytes Relative 10 %   Monocytes Absolute 1.6 (H) 0.1 - 1.0 K/uL   Eosinophils Relative 0 %   Eosinophils Absolute 0.0 0.0 - 0.5 K/uL   Basophils Relative 0 %   Basophils Absolute 0.1 0.0 - 0.1 K/uL   Immature Granulocytes 1 %   Abs Immature Granulocytes 0.19 (H) 0.00 - 0.07 K/uL    Comment: Performed at [facility], [address], [place], [place] [address]  Brain natriuretic peptide     Status: Abnormal   Collection Time: 10/25/22  3:00 PM  Result Value Ref Range   B Natriuretic Peptide 684.8 (H) 0.0 - 100.0 pg/mL  Comment: Performed at [facility], [address], [place], [place] [address]  I-Stat Lactic Acid, ED     Status: Abnormal   Collection Time: 10/25/22  3:15 PM  Result Value Ref Range   Lactic Acid, Venous 2.5 (HH) 0.5 - 1.9 mmol/L   Comment NOTIFIED PHYSICIAN   Urinalysis, w/ Reflex  to Culture (Infection Suspected) -Urine, Clean Catch     Status: Abnormal   Collection Time: 10/25/22  3:51 PM  Result Value Ref Range   Specimen Source URINE, CATHETERIZED    Color, Urine AMBER (A) YELLOW    Comment: BIOCHEMICALS MAY BE AFFECTED BY COLOR   APPearance CLEAR CLEAR   Specific Gravity, Urine 1.020 1.005 - 1.030   pH 5.0 5.0 - 8.0   Glucose, UA NEGATIVE NEGATIVE mg/dL   Hgb urine dipstick NEGATIVE NEGATIVE   Bilirubin Urine NEGATIVE NEGATIVE   Ketones, ur NEGATIVE NEGATIVE mg/dL   Protein, ur NEGATIVE NEGATIVE mg/dL   Nitrite NEGATIVE NEGATIVE   Leukocytes,Ua NEGATIVE NEGATIVE   RBC / HPF 0-5 0 - 5 RBC/hpf   WBC, UA 0-5 0 - 5 WBC/hpf    Comment:        Reflex urine culture not performed if WBC <=10, OR if Squamous epithelial cells >5. If Squamous epithelial cells >5 suggest recollection.    Bacteria, UA NONE SEEN NONE SEEN   Squamous Epithelial / HPF 0-5 0 - 5 /HPF    Comment: Performed at [facility], [address], [place], [place] [address]  Troponin I (High Sensitivity)     Status: Abnormal   Collection Time: 10/25/22  5:19 PM  Result Value Ref Range   Troponin I (High Sensitivity) 256 (HH) <18 ng/L    Comment: CRITICAL RESULT CALLED TO, READ BACK BY AND VERIFIED WITH A BROWN RN 10/25/2022 1832 BNUNNERY (NOTE) Elevated high sensitivity troponin I (hsTnI) values and significant  changes across serial measurements may suggest ACS but many other  chronic and acute conditions are known to elevate hsTnI results.  Refer to the "Links" section for chest pain algorithms and additional  guidance. Performed at [facility], [address], [place], [place] [address]   I-Stat Lactic Acid, ED     Status: Abnormal   Collection Time: 10/25/22  5:23 PM  Result Value Ref Range   Lactic Acid, Venous 3.5 (HH) 0.5 - 1.9 mmol/L   Comment NOTIFIED PHYSICIAN   I-Stat Lactic Acid, ED     Status: Abnormal   Collection Time: 10/25/22  7:41 PM  Result Value Ref  Range   Lactic Acid, Venous 3.1 (HH) 0.5 - 1.9 mmol/L   Comment NOTIFIED PHYSICIAN   CBG monitoring, ED     Status: Abnormal   Collection Time: 10/25/22  9:58 PM  Result Value Ref Range   Glucose-Capillary 108 (H) 70 - 99 mg/dL    Comment: Glucose reference range applies only to samples taken after fasting for at least 8 hours.  Hemoglobin A1c     Status: Abnormal   Collection Time: 10/26/22 12:28 AM  Result Value Ref Range   Hgb A1c MFr Bld 6.0 (H) 4.8 - 5.6 %    Comment: (NOTE) Pre diabetes:          5.7%-6.4%  Diabetes:              >6.4%  Glycemic control for   <7.0% adults with diabetes    Mean Plasma Glucose 125.5 mg/dL    Comment: Performed  at [facility], [address], [place], [place] [address]  Comprehensive metabolic panel     Status: Abnormal   Collection Time: 10/26/22 12:28 AM  Result Value Ref Range   Sodium 140 135 - 145 mmol/L   Potassium 4.1 3.5 - 5.1 mmol/L   Chloride 101 98 - 111 mmol/L   CO2 26 22 - 32 mmol/L   Glucose, Bld 119 (H) 70 - 99 mg/dL    Comment: Glucose reference range applies only to samples taken after fasting for at least 8 hours.   BUN 21 8 - 23 mg/dL   Creatinine, Ser 1.60 0.61 - 1.24 mg/dL   Calcium 8.1 (L) 8.9 - 10.3 mg/dL   Total Protein 4.9 (L) 6.5 - 8.1 g/dL   Albumin 2.3 (L) 3.5 - 5.0 g/dL   AST 25 15 - 41 U/L   ALT 13 0 - 44 U/L   Alkaline Phosphatase 46 38 - 126 U/L   Total Bilirubin 1.0 0.3 - 1.2 mg/dL   GFR, Estimated >10 >93 mL/min    Comment: (NOTE) Calculated using the CKD-EPI Creatinine Equation (2021)    Anion gap 13 5 - 15    Comment: Performed at [facility], [address], [place], [place] [address]  CBC     Status: Abnormal   Collection Time: 10/26/22 12:28 AM  Result Value Ref Range   WBC 21.7 (H) 4.0 - 10.5 K/uL   RBC 3.87 (L) 4.22 - 5.81 MIL/uL   Hemoglobin 11.8 (L) 13.0 - 17.0 g/dL   HCT 32.2 (L) 02.5 - 42.7 %   MCV 95.3 80.0 - 100.0 fL   MCH 30.5 26.0 - 34.0 pg   MCHC 32.0 30.0 - 36.0  g/dL   RDW 06.2 37.6 - 28.3 %   Platelets 149 (L) 150 - 400 K/uL   nRBC 0.0 0.0 - 0.2 %    Comment: Performed at [facility], [address] [place], [place] [address]  Troponin I (High Sensitivity)     Status: Abnormal   Collection Time: 10/26/22 12:28 AM  Result Value Ref Range   Troponin I (High Sensitivity) 292 (HH) <18 ng/L    Comment: CRITICAL VALUE NOTED. VALUE IS CONSISTENT WITH PREVIOUSLY REPORTED/CALLED VALUE (NOTE) Elevated high sensitivity troponin I (hsTnI) values and significant  changes across serial measurements may suggest ACS but many other  chronic and acute conditions are known to elevate hsTnI results.  Refer to the "Links" section for chest pain algorithms and additional  guidance. Performed at [facility], [address] [place], [place] [address]   Magnesium     Status: Abnormal   Collection Time: 10/26/22 12:28 AM  Result Value Ref Range   Magnesium 1.5 (L) 1.7 - 2.4 mg/dL    Comment: Performed at [facility], [address] [place], [place] [address]  Procalcitonin     Status: None   Collection Time: 10/26/22 12:28 AM  Result Value Ref Range   Procalcitonin 13.52 ng/mL    Comment:        Interpretation: PCT >= 10 ng/mL: Important systemic inflammatory response, almost exclusively due to severe bacterial sepsis or septic shock. (NOTE)       Sepsis PCT Algorithm           Lower Respiratory Tract  Infection PCT Algorithm    ----------------------------     ----------------------------         PCT < 0.25 ng/mL                PCT < 0.10 ng/mL          Strongly encourage             Strongly discourage   discontinuation of antibiotics    initiation of antibiotics    ----------------------------     -----------------------------       PCT 0.25 - 0.50 ng/mL            PCT 0.10 - 0.25 ng/mL               OR       >80% decrease in PCT            Discourage initiation of                                             antibiotics      Encourage discontinuation           of antibiotics    ----------------------------     -----------------------------         PCT >= 0.50 ng/mL              PCT 0.26 - 0.50 ng/mL                AND       <80% decrease in PCT             Encourage initiation of                                             antibiotics       Encourage continuation           of antibiotics    ----------------------------     -----------------------------        PCT >= 0.50 ng/mL                  PCT > 0.50 ng/mL               AND         increase in PCT                  Strongly encourage                                      initiation of antibiotics    Strongly encourage escalation           of antibiotics                                     -----------------------------                                           PCT <= 0.25 ng/mL  OR                                        > 80% decrease in PCT                                      Discontinue / Do not initiate                                             antibiotics  Performed at [facility], [address], [place], [place] [address]   Troponin I (High Sensitivity)     Status: Abnormal   Collection Time: 10/26/22  2:33 AM  Result Value Ref Range   Troponin I (High Sensitivity) 350 (HH) <18 ng/L    Comment: CRITICAL VALUE NOTED. VALUE IS CONSISTENT WITH PREVIOUSLY REPORTED/CALLED VALUE (NOTE) Elevated high sensitivity troponin I (hsTnI) values and significant  changes across serial measurements may suggest ACS but many other  chronic and acute conditions are known to elevate hsTnI results.  Refer to the "Links" section for chest pain algorithms and additional  guidance. Performed at [facility] Lab, [address], [place], [place] [address]   Lactic acid, plasma     Status: None   Collection Time: 10/26/22  5:44 AM  Result Value Ref Range   Lactic Acid,  Venous 1.8 0.5 - 1.9 mmol/L    Comment: Performed at [facility], [address] [place], [place] [address]  CBG monitoring, ED     Status: Abnormal   Collection Time: 10/26/22  8:02 AM  Result Value Ref Range   Glucose-Capillary 108 (H) 70 - 99 mg/dL    Comment: Glucose reference range applies only to samples taken after fasting for at least 8 hours.   CT CHEST ABDOMEN PELVIS W CONTRAST  Result Date: 10/25/2022 CLINICAL DATA:  Chest pain EXAM: CT CHEST, ABDOMEN, AND PELVIS WITH CONTRAST TECHNIQUE: Multidetector CT imaging of the chest, abdomen and pelvis was performed following the standard protocol during bolus administration of intravenous contrast. RADIATION DOSE REDUCTION: This exam was performed according to the departmental dose-optimization program which includes automated exposure control, adjustment of the mA and/or kV according to patient size and/or use of iterative reconstruction technique. CONTRAST:  75mL OMNIPAQUE IOHEXOL 350 MG/ML SOLN COMPARISON:  None Available. FINDINGS: Limited exam due to anasarca and streak artifact related to patient arm position. CT CHEST FINDINGS Cardiovascular: Cardiomegaly. Moderate pericardial effusion. Caliber thoracic aorta with moderate atherosclerotic disease. Severe coronary artery calcifications. Mediastinum/Nodes: Esophagus and thyroid are unremarkable. No enlarged lymph nodes seen in the chest. Lungs/Pleura: Central airways are patent. Small left-greater-than-right pleural effusions and atelectasis. No pneumothorax. Musculoskeletal: No chest wall mass or suspicious bone lesions identified. CT ABDOMEN PELVIS FINDINGS Hepatobiliary: No focal liver abnormality is seen, although evaluation of the liver is greatly limited due to artifact. Gallbladder is grossly normal. No evidence of biliary ductal dilation. Pancreas: Unremarkable. Spleen: Normal in size without focal abnormality. Adrenals/Urinary Tract: Bilateral adrenal glands are unremarkable. No  hydronephrosis. Nonobstructing left renal stone. Bilateral low-attenuation renal lesions which are likely simple cysts, artifact limits evaluation, no specific follow-up imaging is necessary. Bladder wall thickening. Stomach/Bowel: Stomach is within  normal limits. No definite bowel wall thickening, although evaluation of the bowel loops is markedly limited. No evidence of obstruction. Vascular/Lymphatic: Aortic atherosclerosis. Mildly enlarged periaortic lymph node measuring 12 mm in short axis on series 3, image 75. Bilateral enlarged inguinal lymph nodes. Reference left inguinal lymph node measuring 1.6 mm in short axis on series 3, image 127. Reproductive: Prostate is unremarkable. Other: Diffuse body wall edema. Small fat containing periumbilical hernia. Diffuse mesenteric stranding and trace abdominopelvic ascites. Musculoskeletal: No acute or significant osseous findings. IMPRESSION: 1. Cardiomegaly and moderate pericardial effusion. 2. Small left-greater-than-right pleural effusions and atelectasis. 3. Evaluation of the abdomen and pelvis is markedly limited due to streak artifact and diffuse anasarca. 4. Diffuse mesenteric stranding and trace abdominopelvic ascites, possibly related to volume status. 5. Bladder wall thickening, findings can be seen in the setting of cystitis. Correlate with urinalysis. 6. Enlarged periaortic and bilateral inguinal lymph nodes, possibly reactive. Consider follow-up CT of the abdomen and pelvis in 3 months to ensure resolution. 7. Severe coronary artery calcifications and aortic Atherosclerosis (ICD10-I70.0). Electronically Signed   By: Allegra Lai M.D.   On: 10/25/2022 18:41   DG Chest Port 1 View  Result Date: 10/25/2022 CLINICAL DATA:  Shortness of breath EXAM: PORTABLE CHEST 1 VIEW COMPARISON:  X-ray 04/14/2009 FINDINGS: Enlarged cardiopericardial silhouette. Calcified aorta. Prominent central vasculature. Tiny left effusion. Mild lung base opacities. Overlapping  cardiac leads. IMPRESSION: Enlarged cardiopericardial silhouette. Tiny left effusion. Mild lung base opacities, left-greater-than-right. Subtle infiltrates possible. Recommend follow-up. Electronically Signed   By: Karen Kays M.D.   On: 10/25/2022 15:21    Pending Labs Unresulted Labs (From admission, onward)     Start     Ordered   10/27/22 0500  Magnesium  Tomorrow morning,   R        10/26/22 0815   10/27/22 0500  Culture, blood (Routine X 2) w Reflex to ID Panel  BLOOD CULTURE X 2,   R      10/26/22 0832   10/26/22 0500  Comprehensive metabolic panel  Daily,   R      10/25/22 1933   10/26/22 0500  CBC  Daily,   R      10/25/22 1933   10/25/22 1934  Expectorated Sputum Assessment w Gram Stain, Rflx to Resp Cult  Once,   R       Question Answer Comment  Patient immune status Immunocompromised   Release to patient Immediate      10/25/22 1933   10/25/22 1934  Strep pneumoniae urinary antigen  Once,   R        10/25/22 1933   10/25/22 1934  Legionella Pneumophila Serogp 1 Ur Ag  Once,   R        10/25/22 1933   10/25/22 1418  CBC with Differential  Once,   STAT        10/25/22 1418            Vitals/Pain Today's Vitals   10/26/22 0645 10/26/22 0745 10/26/22 0800 10/26/22 0804  BP: (!) 128/50 (!) 134/54 (!) 146/61   Pulse: 77 98 (!) 101   Resp: 20 17    Temp:   98.6 F (37 C)   TempSrc:      SpO2: 92% 92% 92%   Weight:      Height:      PainSc:    0-No pain    Isolation Precautions No active isolations  Medications Medications  apixaban (ELIQUIS) tablet 5  mg (5 mg Oral Given 10/26/22 0800)  doxycycline (VIBRA-TABS) tablet 100 mg (100 mg Oral Given 10/26/22 0800)  sodium chloride flush (NS) 0.9 % injection 3 mL (3 mLs Intravenous Not Given 10/25/22 2220)  sodium chloride flush (NS) 0.9 % injection 3 mL (has no administration in time range)  0.9 %  sodium chloride infusion (has no administration in time range)  acetaminophen (TYLENOL) tablet 650 mg (has no  administration in time range)    Or  acetaminophen (TYLENOL) suppository 650 mg (has no administration in time range)  ondansetron (ZOFRAN) tablet 4 mg (has no administration in time range)    Or  ondansetron (ZOFRAN) injection 4 mg (has no administration in time range)  insulin aspart (novoLOG) injection 0-6 Units ( Subcutaneous Not Given 10/26/22 0804)  insulin aspart (novoLOG) injection 0-5 Units (0 Units Subcutaneous Not Given 10/25/22 2200)  lactated ringers infusion ( Intravenous New Bag/Given 10/26/22 0834)  aspirin chewable tablet 324 mg (324 mg Oral Not Given 10/25/22 2133)  aspirin EC tablet 81 mg (81 mg Oral Given 10/26/22 0800)  atorvastatin (LIPITOR) tablet 40 mg (40 mg Oral Given 10/26/22 0800)  melatonin tablet 5 mg (5 mg Oral Given 10/26/22 0248)  cefTRIAXone (ROCEPHIN) 2 g in sodium chloride 0.9 % 100 mL IVPB (has no administration in time range)  magnesium sulfate IVPB 2 g 50 mL (2 g Intravenous New Bag/Given 10/26/22 0833)  cefTRIAXone (ROCEPHIN) 2 g in sodium chloride 0.9 % 100 mL IVPB (0 g Intravenous Stopped 10/25/22 1646)  azithromycin (ZITHROMAX) 500 mg in sodium chloride 0.9 % 250 mL IVPB (0 mg Intravenous Stopped 10/25/22 1618)  acetaminophen (TYLENOL) tablet 1,000 mg (1,000 mg Oral Given 10/25/22 1629)  lactated ringers bolus 1,000 mL (0 mLs Intravenous Stopped 10/25/22 1717)  lactated ringers bolus 1,700 mL (0 mLs Intravenous Stopped 10/25/22 1924)  piperacillin-tazobactam (ZOSYN) IVPB 3.375 g (0 g Intravenous Stopped 10/25/22 1957)  iohexol (OMNIPAQUE) 350 MG/ML injection 75 mL (75 mLs Intravenous Contrast Given 10/25/22 1800)  potassium chloride 10 mEq in 100 mL IVPB (0 mEq Intravenous Stopped 10/25/22 2357)  albumin human 25 % solution 25 g ( Intravenous Stopped 10/25/22 2322)    Mobility walks     Focused Assessments Cardiac Assessment Handoff:  Cardiac Rhythm: Atrial fibrillation No results found for: "CKTOTAL", "CKMB", "CKMBINDEX", "TROPONINI" No results found for:  "DDIMER" Does the Patient currently have chest pain? No    R Recommendations: See Admitting Provider Note  Report given to:   Additional Notes:

## 2022-10-26 NOTE — Consult Note (Signed)
[facility]                                                                                        Patient Identification: Patient Name: Mason Hill MRN: [id number] Admit Date: 10/25/2022  2:12 PM Today's Date: 10/26/2022 Reason for consult: GPC bacteremia  Requesting provider: Dr. Renford Dills  Principal Problem:   Severe sepsis [facility]) Active Problems:   CAP (community acquired pneumonia)   Pericardial effusion   Elevated troponin   Essential hypertension   Chronic kidney disease (CKD), stage III (moderate) (HCC)   Paroxysmal atrial fibrillation (HCC)   DM (diabetes mellitus) type II controlled with renal manifestation (HCC)   Chronic systolic CHF (congestive heart failure) (HCC)   History of coronary artery disease - small OM occlusion; med Rx   Hypokalemia   Antibiotics:  Vancomycin 8/29 Doxycycline8/29-c Ceftriaxone 8/29 Azithromycin 8/29  Lines/Hardware:  Assessment 82 year old male HTN, DM, CKD, HLD,  A-fib, CHF, MI/CAD who presented to the ED from home with sudden onset substernal chest pain/midepigastric pain.  Admitted with  # Group B strep bacteremia - source unclear, no cellulitis or signs of SSTI on exam - r/o endocarditis  - TTE 8/30 no vegetations or endocarditis   Recommendations  Will change IV antibiotics to IV penicillin only 2 sets of repeat blood cultures ordered for tomorrow Will need TEE to r/o endocarditis  Monitor CBC, BMP  Dr Drue Second covering until Monday.  I am back Tuesday.  Rest of the management as per the primary team. Please call with questions or concerns.  Thank you for the consult  __________________________________________________________________________________________________________ HPI and Hospital Course: 82 year old male HTN, DM, CKD, HLD,  A-fib, CHF, MI/CAD who presented to the ED from home with sudden onset substernal chest  pain/midepigastric pain.  At EMS arrival, patient was febrile low blood pressure, tachycardic, tachypneic, spo2 80% on room air.   At ED, febrile Labs remarkable for k 3.3, albumin 1.9, TB 1.4, BNP 684.8, troponin 252, lactic acid 2.5, WBC 16.4 SARS-CoV-2 negative, UA unremarkable for UTI 8/29 blood cx 2/2 sets GPC in both sets, BCID as strep agalactiae  Received IVF, broad-spectrum antibiotics Cardiology consulted for elevated troponins   Denies prior fevers, chills. Denies SOB, cough, GI or GU symptoms, Denies back pain, peripheral joint pain and swelling. H/o lymphedema in lower extremities and reports being treated for cellulitis 1 and half years ago. Denies any hardware  ROS: General- Denies fever, chills, loss of appetite and loss of weight HEENT - Denies headache, blurry vision, neck pain, sinus pain Chest - Denies any SOB or cough CVS- Denies any dizziness/lightheadedness, syncopal attacks, palpitations Abdomen- Denies any nausea, vomiting, hematochezia and diarrhea Neuro - Denies any weakness, numbness, tingling sensation Psych - Denies any changes in mood irritability or depressive symptoms GU- Denies any burning, dysuria, hematuria or increased frequency of urination Skin - denies any rashes/lesions MSK - denies any joint pain/swelling or restricted ROM   Past Medical History:  Diagnosis Date   A-fib (HCC)    AMI (acute myocardial infarction) (HCC)    Diabetes mellitus without complication (HCC)    Hypertension  Past Surgical History:  Procedure Laterality Date   HERNIA REPAIR       Scheduled Meds:  apixaban  5 mg Oral BID   aspirin  324 mg Oral Once   aspirin EC  81 mg Oral Daily   atorvastatin  40 mg Oral Daily   doxycycline  100 mg Oral Q12H   insulin aspart  0-5 Units Subcutaneous QHS   insulin aspart  0-6 Units Subcutaneous TID WC   sodium chloride flush  3 mL Intravenous Q12H   Continuous Infusions:  sodium chloride     cefTRIAXone (ROCEPHIN)  IV      lactated ringers 75 mL/hr at 10/25/22 1946   magnesium sulfate bolus IVPB     PRN Meds:.sodium chloride, acetaminophen **OR** acetaminophen, melatonin, ondansetron **OR** ondansetron (ZOFRAN) IV, sodium chloride flush  Allergies  Allergen Reactions   Codeine Other (See Comments) and Rash    Other Reaction: BLISTERING, PEELING.   Erythromycin    Isosorbide Nitrate     Other reaction(s): Headache   Social History   Socioeconomic History   Marital status: Divorced    Spouse name: Not on file   Number of children: Not on file   Years of education: Not on file   Highest education level: Not on file  Occupational History   Not on file  Tobacco Use   Smoking status: Never   Smokeless tobacco: Never  Vaping Use   Vaping status: Never Used  Substance and Sexual Activity   Alcohol use: Never   Drug use: Never   Sexual activity: Never  Other Topics Concern   Not on file  Social History Narrative   Not on file   Social Determinants of Health   Financial Resource Strain: Low Risk  (06/21/2022)   Received from [facility]   Overall Financial Resource Strain (CARDIA)    Difficulty of Paying Living Expenses: Not hard at all  Food Insecurity: No Food Insecurity (06/21/2022)   Received from [facility]   Hunger Vital Sign    Worried About Running Out of Food in the Last Year: Never true    Ran Out of Food in the Last Year: Never true  Transportation Needs: No Transportation Needs (06/21/2022)   Received from [facility] - Transportation    In the past 12 months, has lack of transportation kept you from medical appointments or from getting medications?: No    Lack of Transportation (Non-Medical): No  Physical Activity: Insufficiently Active (07/24/2019)   Received from [facility]   Exercise Vital Sign    Days of Exercise per Week: 2 days    Minutes of Exercise per Session: 60 min  Stress: No  Stress Concern Present (07/24/2019)   Received from [facility] - Occupational Stress Questionnaire    Feeling of Stress : Not at all  Social Connections: Socially Integrated (07/24/2019)   Received from [facility]   Social Connection and Isolation Panel [NHANES]    Frequency of Communication with Friends and Family: Three times a week    Frequency of Social Gatherings with Friends and Family: Once a week    Attends Religious Services: More than 4 times per year    Active Member of [employer] or Organizations: Yes    Attends Engineer, structural: More than 4 times per year    Marital Status: Married  Catering manager Violence:  Not on file   History reviewed. No pertinent family history.  Vitals BP (!) 146/61   Pulse (!) 101   Temp 98.6 F (37 C)   Resp 17   Ht 6' (1.829 m)   Wt 90.7 kg   SpO2 92%   BMI 27.12 kg/m    Physical Exam Constitutional: Elderly male lying in the bed, NAD    Comments: HEENT WNL  Cardiovascular:     Rate and Rhythm: Normal rate and irregular rhythm.     Heart sounds: S1 and S2  Pulmonary:     Effort: Pulmonary effort is normal.     Comments: Normal lung sounds  Abdominal:     Palpations: Abdomen is soft.     Tenderness: Nondistended and nontender  Musculoskeletal:        General: No swelling or tenderness in peripheral joints.  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   Pertinent Microbiology Results for orders placed or performed during the hospital encounter of 10/25/22  Culture, blood (Routine x 2)     Status: None (Preliminary result)   Collection Time: 10/25/22  2:23 PM   Specimen: BLOOD RIGHT ARM  Result Value Ref Range Status   Specimen Description BLOOD RIGHT  ARM  Final   Special Requests   Final    BOTTLES DRAWN AEROBIC AND ANAEROBIC Blood Culture results may not be optimal due to an excessive volume of blood received in culture bottles   Culture  Setup Time   Final    GRAM POSITIVE COCCI IN BOTH AEROBIC AND ANAEROBIC BOTTLES CRITICAL VALUE NOTED.  VALUE IS CONSISTENT WITH PREVIOUSLY REPORTED AND CALLED VALUE. Performed at [facility], [address], [place], [place] [address]    Culture GRAM POSITIVE COCCI  Final   Report Status PENDING  Incomplete  SARS Coronavirus 2 by RT PCR (hospital order, performed in [facility] hospital lab) *cepheid single result test* Anterior Nasal Swab     Status: None   Collection Time: 10/25/22  2:30 PM   Specimen: Anterior Nasal Swab  Result Value Ref Range Status   SARS Coronavirus 2 by RT PCR NEGATIVE NEGATIVE Final    Comment: Performed at [facility], [address] [place], [place] [address]  Culture, blood (Routine x 2)     Status: None (Preliminary result)   Collection Time: 10/25/22  2:50 PM   Specimen: BLOOD RIGHT HAND  Result Value Ref Range Status   Specimen Description BLOOD RIGHT HAND  Final   Special Requests   Final    BOTTLES DRAWN AEROBIC AND ANAEROBIC Blood Culture adequate volume   Culture  Setup Time   Final    GRAM POSITIVE COCCI ANAEROBIC BOTTLE ONLY Organism ID to follow CRITICAL RESULT CALLED TO, READ BACK BY AND VERIFIED WITH: J Banner - [facility]  10/26/22 MK Performed at [facility], [address], [place], [place] [address]    Culture [facility] POSITIVE COCCI  Final   Report Status PENDING  Incomplete  Blood Culture ID Panel (Reflexed)     Status: Abnormal   Collection Time: 10/25/22  2:50 PM  Result Value Ref Range Status   Enterococcus faecalis NOT DETECTED NOT DETECTED Final   Enterococcus Faecium NOT DETECTED NOT DETECTED Final   Listeria  monocytogenes NOT DETECTED NOT DETECTED Final   Staphylococcus species NOT DETECTED NOT DETECTED Final    Staphylococcus aureus (BCID) NOT DETECTED NOT DETECTED Final   Staphylococcus epidermidis NOT DETECTED NOT DETECTED Final   Staphylococcus lugdunensis NOT DETECTED NOT DETECTED Final   Streptococcus species DETECTED (A) NOT DETECTED Final    Comment: CRITICAL RESULT CALLED TO, READ BACK BY AND VERIFIED WITH: J WYLAND,[email or web address]  10/26/22 MK    Streptococcus agalactiae DETECTED (A) NOT DETECTED Final    Comment: CRITICAL RESULT CALLED TO, READ BACK BY AND VERIFIED WITH: J [facility]  10/26/22 MK    Streptococcus pneumoniae NOT DETECTED NOT DETECTED Final   Streptococcus pyogenes NOT DETECTED NOT DETECTED Final   A.calcoaceticus-baumannii NOT DETECTED NOT DETECTED Final   Bacteroides fragilis NOT DETECTED NOT DETECTED Final   Enterobacterales NOT DETECTED NOT DETECTED Final   Enterobacter cloacae complex NOT DETECTED NOT DETECTED Final   Escherichia coli NOT DETECTED NOT DETECTED Final   Klebsiella aerogenes NOT DETECTED NOT DETECTED Final   Klebsiella oxytoca NOT DETECTED NOT DETECTED Final   Klebsiella pneumoniae NOT DETECTED NOT DETECTED Final   Proteus species NOT DETECTED NOT DETECTED Final   Salmonella species NOT DETECTED NOT DETECTED Final   Serratia marcescens NOT DETECTED NOT DETECTED Final   Haemophilus influenzae NOT DETECTED NOT DETECTED Final   Neisseria meningitidis NOT DETECTED NOT DETECTED Final   Pseudomonas aeruginosa NOT DETECTED NOT DETECTED Final   Stenotrophomonas maltophilia NOT DETECTED NOT DETECTED Final   Candida albicans NOT DETECTED NOT DETECTED Final   Candida auris NOT DETECTED NOT DETECTED Final   Candida glabrata NOT DETECTED NOT DETECTED Final   Candida krusei NOT DETECTED NOT DETECTED Final   Candida parapsilosis NOT DETECTED NOT DETECTED Final   Candida tropicalis NOT DETECTED NOT DETECTED Final   Cryptococcus neoformans/gattii NOT DETECTED NOT DETECTED Final    Comment: Performed at [facility], [address], [place], [place]  [address]   Pertinent Lab seen by me:    Latest Ref Rng & Units 10/26/2022   12:28 AM 10/25/2022    2:51 PM 07/17/2019    9:29 AM  CBC  WBC 4.0 - 10.5 K/uL 21.7  16.4  5.9   Hemoglobin 13.0 - 17.0 g/dL 60.4  54.0  98.1   Hematocrit 39.0 - 52.0 % 36.9  35.0  38.4   Platelets 150 - 400 K/uL 149  174  151       Latest Ref Rng & Units 10/26/2022   12:28 AM 10/25/2022    2:51 PM 07/17/2019    9:29 AM  CMP  Glucose 70 - 99 mg/dL 191  478  295   BUN 8 - 23 mg/dL 21  18  13    Creatinine 0.61 - 1.24 mg/dL 6.21  3.08  6.57   Sodium 135 - 145 mmol/L 140  141  136   Potassium 3.5 - 5.1 mmol/L 4.1  3.3  3.2   Chloride 98 - 111 mmol/L 101  106  98   CO2 22 - 32 mmol/L 26  27  28    Calcium 8.9 - 10.3 mg/dL 8.1  7.7  9.0   Total Protein 6.5 - 8.1 g/dL 4.9  4.3    Total Bilirubin 0.3 - 1.2 mg/dL 1.0  1.4    Alkaline Phos 38 - 126 U/L 46  46    AST 15 - 41 U/L 25  25    ALT 0 - 44 U/L 13  13  Pertinent Imagings/Other Imagings Plain films and CT images have been personally visualized and interpreted; radiology reports have been reviewed. Decision making incorporated into the Impression / Recommendations.  CT CHEST ABDOMEN PELVIS W CONTRAST  Result Date: 10/25/2022 CLINICAL DATA:  Chest pain EXAM: CT CHEST, ABDOMEN, AND PELVIS WITH CONTRAST TECHNIQUE: Multidetector CT imaging of the chest, abdomen and pelvis was performed following the standard protocol during bolus administration of intravenous contrast. RADIATION DOSE REDUCTION: This exam was performed according to the departmental dose-optimization program which includes automated exposure control, adjustment of the mA and/or kV according to patient size and/or use of iterative reconstruction technique. CONTRAST:  75mL OMNIPAQUE IOHEXOL 350 MG/ML SOLN COMPARISON:  None Available. FINDINGS: Limited exam due to anasarca and streak artifact related to patient arm position. CT CHEST FINDINGS Cardiovascular: Cardiomegaly. Moderate pericardial effusion.  Caliber thoracic aorta with moderate atherosclerotic disease. Severe coronary artery calcifications. Mediastinum/Nodes: Esophagus and thyroid are unremarkable. No enlarged lymph nodes seen in the chest. Lungs/Pleura: Central airways are patent. Small left-greater-than-right pleural effusions and atelectasis. No pneumothorax. Musculoskeletal: No chest wall mass or suspicious bone lesions identified. CT ABDOMEN PELVIS FINDINGS Hepatobiliary: No focal liver abnormality is seen, although evaluation of the liver is greatly limited due to artifact. Gallbladder is grossly normal. No evidence of biliary ductal dilation. Pancreas: Unremarkable. Spleen: Normal in size without focal abnormality. Adrenals/Urinary Tract: Bilateral adrenal glands are unremarkable. No hydronephrosis. Nonobstructing left renal stone. Bilateral low-attenuation renal lesions which are likely simple cysts, artifact limits evaluation, no specific follow-up imaging is necessary. Bladder wall thickening. Stomach/Bowel: Stomach is within normal limits. No definite bowel wall thickening, although evaluation of the bowel loops is markedly limited. No evidence of obstruction. Vascular/Lymphatic: Aortic atherosclerosis. Mildly enlarged periaortic lymph node measuring 12 mm in short axis on series 3, image 75. Bilateral enlarged inguinal lymph nodes. Reference left inguinal lymph node measuring 1.6 mm in short axis on series 3, image 127. Reproductive: Prostate is unremarkable. Other: Diffuse body wall edema. Small fat containing periumbilical hernia. Diffuse mesenteric stranding and trace abdominopelvic ascites. Musculoskeletal: No acute or significant osseous findings. IMPRESSION: 1. Cardiomegaly and moderate pericardial effusion. 2. Small left-greater-than-right pleural effusions and atelectasis. 3. Evaluation of the abdomen and pelvis is markedly limited due to streak artifact and diffuse anasarca. 4. Diffuse mesenteric stranding and trace abdominopelvic  ascites, possibly related to volume status. 5. Bladder wall thickening, findings can be seen in the setting of cystitis. Correlate with urinalysis. 6. Enlarged periaortic and bilateral inguinal lymph nodes, possibly reactive. Consider follow-up CT of the abdomen and pelvis in 3 months to ensure resolution. 7. Severe coronary artery calcifications and aortic Atherosclerosis (ICD10-I70.0). Electronically Signed   By: Allegra Lai M.D.   On: 10/25/2022 18:41   DG Chest Port 1 View  Result Date: 10/25/2022 CLINICAL DATA:  Shortness of breath EXAM: PORTABLE CHEST 1 VIEW COMPARISON:  X-ray 04/14/2009 FINDINGS: Enlarged cardiopericardial silhouette. Calcified aorta. Prominent central vasculature. Tiny left effusion. Mild lung base opacities. Overlapping cardiac leads. IMPRESSION: Enlarged cardiopericardial silhouette. Tiny left effusion. Mild lung base opacities, left-greater-than-right. Subtle infiltrates possible. Recommend follow-up. Electronically Signed   By: Karen Kays M.D.   On: 10/25/2022 15:21    I have personally spent 87 minutes involved in face-to-face and non-face-to-face activities for this patient on the day of the visit. Professional time spent includes the following activities: Preparing to see the patient (review of tests), Obtaining and/or reviewing separately obtained history (admission/discharge record), Performing a medically appropriate examination and/or evaluation , Ordering medications/tests/procedures, referring and  communicating with other health care professionals, Documenting clinical information in the EMR, Independently interpreting results (not separately reported), Communicating results to the patient/family/caregiver, Counseling and educating the patient/family/caregiver and Care coordination (not separately reported).  Electronically signed by:   Plan d/w requesting provider as well as ID pharm D  Note: This document was prepared using dragon voice recognition software  and may include unintentional dictation errors.   Odette Fraction, MD Infectious Disease Physician [facility] Pager: [phone]

## 2022-10-26 NOTE — Plan of Care (Signed)
  Problem: Education: Goal: Ability to describe self-care measures that may prevent or decrease complications (Diabetes Survival Skills Education) will improve Outcome: Progressing Goal: Individualized Educational Video(s) Outcome: Progressing   Problem: Coping: Goal: Ability to adjust to condition or change in health will improve Outcome: Progressing   Problem: Fluid Volume: Goal: Ability to maintain a balanced intake and output will improve Outcome: Progressing   Problem: Health Behavior/Discharge Planning: Goal: Ability to identify and utilize available resources and services will improve Outcome: Progressing Goal: Ability to manage health-related needs will improve Outcome: Progressing   Problem: Metabolic: Goal: Ability to maintain appropriate glucose levels will improve Outcome: Progressing   Problem: Nutritional: Goal: Maintenance of adequate nutrition will improve Outcome: Progressing Goal: Progress toward achieving an optimal weight will improve Outcome: Progressing   Problem: Skin Integrity: Goal: Risk for impaired skin integrity will decrease Outcome: Progressing   Problem: Tissue Perfusion: Goal: Adequacy of tissue perfusion will improve Outcome: Progressing   Problem: Education: Goal: Knowledge of General Education information will improve Description: Including pain rating scale, medication(s)/side effects and non-pharmacologic comfort measures Outcome: Progressing   Problem: Health Behavior/Discharge Planning: Goal: Ability to manage health-related needs will improve Outcome: Progressing   Problem: Clinical Measurements: Goal: Ability to maintain clinical measurements within normal limits will improve Outcome: Progressing Goal: Will remain free from infection Outcome: Progressing Goal: Diagnostic test results will improve Outcome: Progressing Goal: Respiratory complications will improve Outcome: Progressing Goal: Cardiovascular complication will  be avoided Outcome: Progressing   Problem: Activity: Goal: Risk for activity intolerance will decrease Outcome: Progressing   Problem: Nutrition: Goal: Adequate nutrition will be maintained Outcome: Progressing   Problem: Coping: Goal: Level of anxiety will decrease Outcome: Progressing   Problem: Elimination: Goal: Will not experience complications related to bowel motility Outcome: Progressing Goal: Will not experience complications related to urinary retention Outcome: Progressing   Problem: Safety: Goal: Ability to remain free from injury will improve Outcome: Progressing   Problem: Skin Integrity: Goal: Risk for impaired skin integrity will decrease Outcome: Progressing   Problem: Pain Managment: Goal: General experience of comfort will improve Outcome: Progressing

## 2022-10-27 ENCOUNTER — Inpatient Hospital Stay (HOSPITAL_COMMUNITY): Payer: Medicare Other

## 2022-10-27 DIAGNOSIS — I480 Paroxysmal atrial fibrillation: Secondary | ICD-10-CM | POA: Diagnosis not present

## 2022-10-27 DIAGNOSIS — E440 Moderate protein-calorie malnutrition: Secondary | ICD-10-CM | POA: Insufficient documentation

## 2022-10-27 DIAGNOSIS — R6520 Severe sepsis without septic shock: Secondary | ICD-10-CM | POA: Diagnosis not present

## 2022-10-27 DIAGNOSIS — A419 Sepsis, unspecified organism: Secondary | ICD-10-CM | POA: Diagnosis not present

## 2022-10-27 LAB — COMPREHENSIVE METABOLIC PANEL
ALT: 14 U/L (ref 0–44)
AST: 21 U/L (ref 15–41)
Albumin: 1.8 g/dL — ABNORMAL LOW (ref 3.5–5.0)
Alkaline Phosphatase: 68 U/L (ref 38–126)
Anion gap: 11 (ref 5–15)
BUN: 24 mg/dL — ABNORMAL HIGH (ref 8–23)
CO2: 28 mmol/L (ref 22–32)
Calcium: 8.2 mg/dL — ABNORMAL LOW (ref 8.9–10.3)
Chloride: 100 mmol/L (ref 98–111)
Creatinine, Ser: 1.07 mg/dL (ref 0.61–1.24)
GFR, Estimated: >60 mL/min (ref >60)
Glucose, Bld: 127 mg/dL — ABNORMAL HIGH (ref 70–99)
Potassium: 3.7 mmol/L (ref 3.5–5.1)
Sodium: 139 mmol/L (ref 135–145)
Total Bilirubin: 0.7 mg/dL (ref 0.3–1.2)
Total Protein: 4.5 g/dL — ABNORMAL LOW (ref 6.5–8.1)

## 2022-10-27 LAB — CBC
HCT: 32.5 % — ABNORMAL LOW (ref 39.0–52.0)
Hemoglobin: 10.6 g/dL — ABNORMAL LOW (ref 13.0–17.0)
MCH: 30.7 pg (ref 26.0–34.0)
MCHC: 32.6 g/dL (ref 30.0–36.0)
MCV: 94.2 fL (ref 80.0–100.0)
Platelets: 131 10*3/uL — ABNORMAL LOW (ref 150–400)
RBC: 3.45 MIL/uL — ABNORMAL LOW (ref 4.22–5.81)
RDW: 14.2 % (ref 11.5–15.5)
WBC: 16.8 10*3/uL — ABNORMAL HIGH (ref 4.0–10.5)
nRBC: 0 % (ref 0.0–0.2)

## 2022-10-27 LAB — LIPID PANEL
Cholesterol: 143 mg/dL (ref 0–200)
HDL: 42 mg/dL (ref >40)
LDL Cholesterol: 82 mg/dL (ref 0–99)
Total CHOL/HDL Ratio: 3.4 ratio
Triglycerides: 95 mg/dL (ref <150)
VLDL: 19 mg/dL (ref 0–40)

## 2022-10-27 LAB — GLUCOSE, CAPILLARY
Glucose-Capillary: 112 mg/dL — ABNORMAL HIGH (ref 70–99)
Glucose-Capillary: 141 mg/dL — ABNORMAL HIGH (ref 70–99)
Glucose-Capillary: 125 mg/dL — ABNORMAL HIGH (ref 70–99)
Glucose-Capillary: 149 mg/dL — ABNORMAL HIGH (ref 70–99)

## 2022-10-27 LAB — PROCALCITONIN: Procalcitonin: 9.14 ng/mL

## 2022-10-27 LAB — MAGNESIUM: Magnesium: 1.8 mg/dL (ref 1.7–2.4)

## 2022-10-27 LAB — C-REACTIVE PROTEIN: CRP: 25 mg/dL — ABNORMAL HIGH (ref <1.0)

## 2022-10-27 LAB — STREP PNEUMONIAE URINARY ANTIGEN: Strep Pneumo Urinary Antigen: NEGATIVE

## 2022-10-27 MED ORDER — SPIRONOLACTONE 25 MG PO TABS
25.0000 mg | ORAL_TABLET | Freq: Once | ORAL | Status: AC
  Administered 2022-10-27: 25 mg via ORAL
  Filled 2022-10-27: qty 1

## 2022-10-27 MED ORDER — POTASSIUM CHLORIDE CRYS ER 20 MEQ PO TBCR
40.0000 meq | EXTENDED_RELEASE_TABLET | Freq: Once | ORAL | Status: AC
  Administered 2022-10-27: 40 meq via ORAL
  Filled 2022-10-27: qty 2

## 2022-10-27 MED ORDER — BOOST PLUS PO LIQD
237.0000 mL | Freq: Two times a day (BID) | ORAL | Status: DC
  Administered 2022-10-27 – 2022-11-03 (×14): 237 mL via ORAL
  Filled 2022-10-27 (×16): qty 237

## 2022-10-27 MED ORDER — METOPROLOL TARTRATE 25 MG PO TABS
25.0000 mg | ORAL_TABLET | Freq: Two times a day (BID) | ORAL | Status: DC
  Administered 2022-10-27 – 2022-11-03 (×12): 25 mg via ORAL
  Filled 2022-10-27 (×15): qty 1

## 2022-10-27 MED ORDER — FUROSEMIDE 10 MG/ML IJ SOLN
60.0000 mg | Freq: Two times a day (BID) | INTRAMUSCULAR | Status: DC
  Administered 2022-10-27 – 2022-10-29 (×5): 60 mg via INTRAVENOUS
  Filled 2022-10-27 (×5): qty 6

## 2022-10-27 MED ORDER — PROSOURCE PLUS PO LIQD
30.0000 mL | Freq: Two times a day (BID) | ORAL | Status: DC
  Administered 2022-10-27 – 2022-11-03 (×15): 30 mL via ORAL
  Filled 2022-10-27 (×15): qty 30

## 2022-10-27 NOTE — Progress Notes (Signed)
PROGRESS NOTE                                                                                                                                                                                                             Patient Demographics:    Mason Hill, is a 82 y.o. male, DOB - 1940/04/22, WUJ:[id number]  Outpatient Primary MD for the patient is Shapely-Quinn, Desiree Lucy, MD    LOS - 2  Admit date - 10/25/2022    Chief Complaint  Patient presents with   Chest Pain       Brief Narrative (HPI from H&P)   82 year old male with history of hypertension, CKD stage III, hyperlipidemia, diabetes type 2, chronic diastolic CHF, paroxysmal A-fib, coronary artery disease, hypertension, hyperlipidemia, peripheral artery disease require intervention who initially presented with midepigastric pain, chest pain, abdominal pain, dry cough, progressive worsening of bilateral lower extremity swelling, dyspnea, orthopnea.  On presentation, blood pressure was soft, had mild fever but saturating fine on room air.  Respiratory viral panel came out of negative.  Lab work showed lactic acid of 2.5, potassium of 3.3, creatinine of 1.6, WBC count of 16.4, elevated BNP of 664, elevated troponin.  EKG showed A-fib.  Chest x-ray showed enlarged cardiac shadow, mid lung base opacity left greater than right.  CT abdomen/pelvis showed cardiomegaly, moderate pericardial effusion.  Patient was admitted for the management of possible sepsis secondary to pneumonia, +++ edema,  Cardiology ,ID also consulted.    Subjective:    Mason Hill today has, No headache, No chest pain, No abdominal pain - No Nausea, No new weakness tingling or numbness, mild SOB.   Assessment  & Plan :    Community-acquired pneumonia, strep agalactiae bacteremia:Chest x-ray showed possible bilateral pneumonia left greater than right.  Blood culture positive for strep agalactiae,  appreciate ID input, TTE shows no vegetation, cardiology requested to arrange for TEE, continue present antibiotics as directed by ID and monitor.  Repeat blood cultures will be monitored.   Enlarged periaortic/bilateral inguinal lymph nodes: Likely reactive.  Recommend follow-up CT abdomen/pelvis in 3 months, cardiology on board.   Anasarca: Has low albumin.  Likely from moderate protein calorie malnutrition, UA has no proteinuria, LFTs are stable, on protein supplementation and diuresis with Lasix and Aldactone, add TED stockings as well.   Pericardial effusion: Noted on  echocardiogram, moderate, cardiology on board.   Coronary artery disease/elevated troponin: Elevated with flat trend.  Complaint of chest pain on admission which has resolved.  Currently chest pain-free.  EKG showed A-fib, PVCs, minimal ST depression in the inferior leads.  Elevated troponin could be from demand ischemia.  Echocardiogram ordered.  Takes Lipitor at home.  Cardiology following   Paroxysmal A-fib: On Eliquis for anticoagulation, and low-dose beta-blocker, cardiology following.  Monitor on telemetry.  Currently in normal sinus rhythm   Chronic diastolic CHF with exacerbation: Takes amlodipine, losartan, Jardiance at home.  Presented with anasarca, elevated BNP.  Chest x-ray showing cardiomegaly.  Continue diuretics, cardiology on board.   Hypertension: Currently antihypertensives on hold.  On losartan, spironolactone, Jardiance, Lasix at home.   Hypokalemia/hypomagnesemia: Currently being monitored and supplemented as needed.   Debility/deconditioning: Patient lives with his grandson.  Walks fine at home.  Continue PT.  Diabetes type 2: A1c of 6.  Jardiance at home.  Currently on sliding scale   Lab Results  Component Value Date   HGBA1C 6.0 (H) 10/26/2022   CBG (last 3)  Recent Labs    10/26/22 1634 10/26/22 2108 10/27/22 0809  GLUCAP 145* 127* 112*          Condition -  Guarded  Family  Communication  :  None  Code Status :  Full  Consults  :  Cards, ID  PUD Prophylaxis :     Procedures  :     CT chest abdomen and pelvis- 1. Cardiomegaly and moderate pericardial effusion. 2. Small left-greater-than-right pleural effusions and atelectasis. 3. Evaluation of the abdomen and pelvis is markedly limited due to streak artifact and diffuse anasarca. 4. Diffuse mesenteric stranding and trace abdominopelvic ascites, possibly related to volume status. 5. Bladder wall thickening, findings can be seen in the setting of cystitis. Correlate with urinalysis. 6. Enlarged periaortic and bilateral inguinal lymph nodes, possibly reactive. Consider follow-up CT of the abdomen and pelvis in 3 months to ensure resolution. 7. Severe coronary artery calcifications and aortic Atherosclerosis (ICD10-I70.0).    TTE -  1. Left ventricular ejection fraction, by estimation, is 60 to 65%. Left ventricular ejection fraction by PLAX is 62 %. The left ventricle has normal function. The left ventricle has no regional wall motion abnormalities. Left ventricular diastolic parameters are indeterminate. There is the interventricular septum is flattened in systole, consistent with right ventricular pressure overload.  2. Right ventricular systolic function is severely reduced. The right ventricular size is moderately enlarged. There is mildly elevated pulmonary artery systolic pressure. The estimated right ventricular systolic pressure is 42.7 mmHg.  3. Right atrial size was severely dilated.  4. Pericardial fluid measures 1.36 cm over the RV apex and up to 2.29 cm around the right atrium.. Moderate pericardial effusion. The pericardial effusion is anterior to the right ventricle.  5. The mitral valve is degenerative. Trivial mitral valve regurgitation.  6. The aortic valve is tricuspid. Aortic valve regurgitation is not visualized.  7. Aortic dilatation noted. There is borderline dilatation of the aortic root, measuring 39  mm.  8. The inferior vena cava is dilated in size with <50% respiratory variability, suggesting right atrial pressure of 15 mmHg.      Disposition Plan  :    Status is: Inpatient   DVT Prophylaxis  :    Place TED hose Start: 10/27/22 0939 SCDs Start: 10/25/22 1934 apixaban (ELIQUIS) tablet 5 mg     Lab Results  Component Value Date  PLT 131 (L) 10/27/2022    Diet :  Diet Order             Diet regular Room service appropriate? Yes with Assist; Fluid consistency: Thin; Fluid restriction: 1500 mL Fluid  Diet effective now                    Inpatient Medications  Scheduled Meds:  (feeding supplement) PROSource Plus  30 mL Oral BID BM   apixaban  5 mg Oral BID   aspirin EC  81 mg Oral Daily   atorvastatin  40 mg Oral Daily   feeding supplement  237 mL Oral BID BM   furosemide  40 mg Intravenous BID   insulin aspart  0-5 Units Subcutaneous QHS   insulin aspart  0-6 Units Subcutaneous TID WC   multivitamin with minerals  1 tablet Oral Daily   sodium chloride flush  3 mL Intravenous Q12H   Continuous Infusions:  sodium chloride     penicillin G potassium 12 Million Units in dextrose 5 % 500 mL CONTINUOUS infusion 12 Million Units (10/27/22 0657)   PRN Meds:.sodium chloride, acetaminophen **OR** acetaminophen, melatonin, ondansetron **OR** ondansetron (ZOFRAN) IV, sodium chloride flush    Objective:   Vitals:   10/26/22 1951 10/27/22 0000 10/27/22 0500 10/27/22 0808  BP: 136/66 (!) 108/51  130/62  Pulse: 96 94  (!) 107  Resp: 18 20    Temp:  98.8 F (37.1 C)  98.9 F (37.2 C)  TempSrc:  Oral  Oral  SpO2: 95% 94%  91%  Weight:   106.7 kg   Height:        Wt Readings from Last 3 Encounters:  10/27/22 106.7 kg  07/17/19 91.2 kg     Intake/Output Summary (Last 24 hours) at 10/27/2022 0941 Last data filed at 10/27/2022 0910 Gross per 24 hour  Intake 257.08 ml  Output 1950 ml  Net -1692.92 ml     Physical Exam  Awake Alert, No new F.N  deficits, Normal affect [place].AT,PERRAL Supple Neck, No JVD,   Symmetrical Chest wall movement, Good air movement bilaterally, CTAB RRR,No Gallops,Rubs or new Murmurs,  +ve B.Sounds, Abd Soft, No tenderness,   2+ edema      Data Review:    Recent Labs  Lab 10/25/22 1451 10/26/22 0028 10/27/22 0650  WBC 16.4* 21.7* 16.8*  HGB 11.2* 11.8* 10.6*  HCT 35.0* 36.9* 32.5*  PLT 174 149* 131*  MCV 94.9 95.3 94.2  MCH 30.4 30.5 30.7  MCHC 32.0 32.0 32.6  RDW 13.7 13.9 14.2  LYMPHSABS 0.3*  --   --   MONOABS 1.6*  --   --   EOSABS 0.0  --   --   BASOSABS 0.1  --   --     Recent Labs  Lab 10/25/22 1451 10/25/22 1500 10/25/22 1515 10/25/22 1723 10/25/22 1941 10/26/22 0028 10/26/22 0544 10/27/22 0650  NA 141  --   --   --   --  140  --  139  K 3.3*  --   --   --   --  4.1  --  3.7  CL 106  --   --   --   --  101  --  100  CO2 27  --   --   --   --  26  --  28  ANIONGAP 8  --   --   --   --  13  --  11  GLUCOSE 105*  --   --   --   --  119*  --  127*  BUN 18  --   --   --   --  21  --  24*  CREATININE 1.16  --   --   --   --  1.15  --  1.07  AST 25  --   --   --   --  25  --  21  ALT 13  --   --   --   --  13  --  14  ALKPHOS 46  --   --   --   --  46  --  68  BILITOT 1.4*  --   --   --   --  1.0  --  0.7  ALBUMIN 1.9*  --   --   --   --  2.3*  --  1.8*  CRP  --   --   --   --   --   --   --  25.0*  PROCALCITON  --   --   --   --   --  13.52  --  9.14  LATICACIDVEN  --   --  2.5* 3.5* 3.1*  --  1.8  --   INR 1.2  --   --   --   --   --   --   --   HGBA1C  --   --   --   --   --  6.0*  --   --   BNP  --  684.8*  --   --   --   --   --   --   MG  --   --   --   --   --  1.5*  --  1.8  CALCIUM 7.7*  --   --   --   --  8.1*  --  8.2*      Recent Labs  Lab 10/25/22 1451 10/25/22 1500 10/25/22 1515 10/25/22 1723 10/25/22 1941 10/26/22 0028 10/26/22 0544 10/27/22 0650  CRP  --   --   --   --   --   --   --  25.0*  PROCALCITON  --   --   --   --   --  13.52  --  9.14   LATICACIDVEN  --   --  2.5* 3.5* 3.1*  --  1.8  --   INR 1.2  --   --   --   --   --   --   --   HGBA1C  --   --   --   --   --  6.0*  --   --   BNP  --  684.8*  --   --   --   --   --   --   MG  --   --   --   --   --  1.5*  --  1.8  CALCIUM 7.7*  --   --   --   --  8.1*  --  8.2*    --------------------------------------------------------------------------------------------------------------- Lab Results  Component Value Date   CHOL 143 10/27/2022   HDL 42 10/27/2022   LDLCALC 82 10/27/2022   TRIG 95 10/27/2022   CHOLHDL 3.4 10/27/2022    Lab Results  Component Value Date   HGBA1C 6.0 (H) 10/26/2022     Radiology Reports DG Chest [facility]  1 View  Result Date: 10/27/2022 CLINICAL DATA:  82 year old male shortness of breath. EXAM: PORTABLE CHEST 1 VIEW COMPARISON:  CT Chest, Abdomen, and Pelvis 10/25/2022 and earlier. FINDINGS: Portable AP semi upright view at 0637 hours. Cardiomegaly. Other mediastinal contours are within normal limits. Visualized tracheal air column is within normal limits. Retrocardiac opacity is stable, with left pleural effusion and atelectasis recently demonstrated on CT. No pneumothorax or pulmonary edema. Stable right lung ventilation. No acute osseous abnormality identified. IMPRESSION: Stable cardiomegaly and left pleural effusion with atelectasis. Electronically Signed   By: Odessa Fleming M.D.   On: 10/27/2022 07:00   ECHOCARDIOGRAM COMPLETE  Result Date: 10/26/2022    ECHOCARDIOGRAM REPORT   Patient Name:   Mason Hill Date of Exam: 10/26/2022 Medical Rec #:  [id number]    Height:       72.0 in Accession #:    [id number]   Weight:       200.0 lb Date of Birth:  January 18, 1941    BSA:          2.131 m Patient Age:    82 years     BP:           122/55 mmHg Patient Gender: M            HR:           86 bpm. Exam Location:  Inpatient Procedure: 2D Echo, Cardiac Doppler and Color Doppler Indications:    NSTEMI I21.4  History:        Patient has no prior history of  Echocardiogram examinations.  Sonographer:    Harriette Bouillon RDCS Referring Phys: [phone] SUBRINA SUNDIL IMPRESSIONS  1. Left ventricular ejection fraction, by estimation, is 60 to 65%. Left ventricular ejection fraction by PLAX is 62 %. The left ventricle has normal function. The left ventricle has no regional wall motion abnormalities. Left ventricular diastolic parameters are indeterminate. There is the interventricular septum is flattened in systole, consistent with right ventricular pressure overload.  2. Right ventricular systolic function is severely reduced. The right ventricular size is moderately enlarged. There is mildly elevated pulmonary artery systolic pressure. The estimated right ventricular systolic pressure is 42.7 mmHg.  3. Right atrial size was severely dilated.  4. Pericardial fluid measures 1.36 cm over the RV apex and up to 2.29 cm around the right atrium.. Moderate pericardial effusion. The pericardial effusion is anterior to the right ventricle.  5. The mitral valve is degenerative. Trivial mitral valve regurgitation.  6. The aortic valve is tricuspid. Aortic valve regurgitation is not visualized.  7. Aortic dilatation noted. There is borderline dilatation of the aortic root, measuring 39 mm.  8. The inferior vena cava is dilated in size with <50% respiratory variability, suggesting right atrial pressure of 15 mmHg. Comparison(s): No prior Echocardiogram. Conclusion(s)/Recommendation(s): Moderate sized pericardial effusion. Cannot exclude tamponade physiology given severe RV dysfunction. Clinical correlation is advised. FINDINGS  Left Ventricle: Left ventricular ejection fraction, by estimation, is 60 to 65%. Left ventricular ejection fraction by PLAX is 62 %. The left ventricle has normal function. The left ventricle has no regional wall motion abnormalities. The left ventricular internal cavity size was normal in size. There is no left ventricular hypertrophy. The interventricular septum is  flattened in systole, consistent with right ventricular pressure overload. Left ventricular diastolic parameters are indeterminate. Right Ventricle: The right ventricular size is moderately enlarged. No increase in right ventricular wall thickness. Right ventricular systolic function is severely reduced. There is mildly elevated pulmonary artery  systolic pressure. The tricuspid regurgitant velocity is 2.63 m/s, and with an assumed right atrial pressure of 15 mmHg, the estimated right ventricular systolic pressure is 42.7 mmHg. Left Atrium: Left atrial size was normal in size. Right Atrium: Right atrial size was severely dilated. Pericardium: Pericardial fluid measures 1.36 cm over the RV apex and up to 2.29 cm around the right atrium. A moderately sized pericardial effusion is present. The pericardial effusion is anterior to the right ventricle. Mitral Valve: The mitral valve is degenerative in appearance. There is mild calcification of the anterior and posterior mitral valve leaflet(s). Mild to moderate mitral annular calcification. Trivial mitral valve regurgitation. Tricuspid Valve: The tricuspid valve is grossly normal. Tricuspid valve regurgitation is mild. Aortic Valve: The aortic valve is tricuspid. Aortic valve regurgitation is not visualized. Pulmonic Valve: The pulmonic valve was grossly normal. Pulmonic valve regurgitation is trivial. Aorta: Aortic dilatation noted. There is borderline dilatation of the aortic root, measuring 39 mm. Venous: The inferior vena cava is dilated in size with less than 50% respiratory variability, suggesting right atrial pressure of 15 mmHg. IAS/Shunts: No atrial level shunt detected by color flow Doppler.  LEFT VENTRICLE PLAX 2D LV EF:         Left            Diastology                ventricular     LV e' medial:    5.77 cm/s                ejection        LV E/e' medial:  18.7                fraction by     LV e' lateral:   8.05 cm/s                PLAX is 62      LV E/e'  lateral: 13.4                %. LVIDd:         5.30 cm LVIDs:         3.50 cm LV PW:         1.00 cm LV IVS:        1.00 cm LVOT diam:     2.20 cm LV SV:         76 LV SV Index:   36 LVOT Area:     3.80 cm  RIGHT VENTRICLE         IVC TAPSE (M-mode): 0.6 cm  IVC diam: 3.80 cm LEFT ATRIUM             Index        RIGHT ATRIUM           Index LA diam:        4.40 cm 2.07 cm/m   RA Area:     27.50 cm LA Vol (A2C):   50.1 ml 23.51 ml/m  RA Volume:   101.00 ml 47.41 ml/m LA Vol (A4C):   26.2 ml 12.30 ml/m LA Biplane Vol: 38.8 ml 18.21 ml/m  AORTIC VALVE LVOT Vmax:   102.00 cm/s LVOT Vmean:  72.200 cm/s LVOT VTI:    0.201 m  AORTA Ao Root diam: 3.90 cm Ao Asc diam:  3.50 cm MITRAL VALVE                TRICUSPID VALVE MV Area (PHT):  4.77 cm     TR Peak grad:   27.7 mmHg MV Decel Time: 159 msec     TR Vmax:        263.00 cm/s MV E velocity: 108.00 cm/s                             SHUNTS                             Systemic VTI:  0.20 m                             Systemic Diam: 2.20 cm Zoila Shutter MD Electronically signed by Zoila Shutter MD Signature Date/Time: 10/26/2022/2:41:21 PM    Final    CT CHEST ABDOMEN PELVIS W CONTRAST  Result Date: 10/25/2022 CLINICAL DATA:  Chest pain EXAM: CT CHEST, ABDOMEN, AND PELVIS WITH CONTRAST TECHNIQUE: Multidetector CT imaging of the chest, abdomen and pelvis was performed following the standard protocol during bolus administration of intravenous contrast. RADIATION DOSE REDUCTION: This exam was performed according to the departmental dose-optimization program which includes automated exposure control, adjustment of the mA and/or kV according to patient size and/or use of iterative reconstruction technique. CONTRAST:  75mL OMNIPAQUE IOHEXOL 350 MG/ML SOLN COMPARISON:  None Available. FINDINGS: Limited exam due to anasarca and streak artifact related to patient arm position. CT CHEST FINDINGS Cardiovascular: Cardiomegaly. Moderate pericardial effusion. Caliber thoracic  aorta with moderate atherosclerotic disease. Severe coronary artery calcifications. Mediastinum/Nodes: Esophagus and thyroid are unremarkable. No enlarged lymph nodes seen in the chest. Lungs/Pleura: Central airways are patent. Small left-greater-than-right pleural effusions and atelectasis. No pneumothorax. Musculoskeletal: No chest wall mass or suspicious bone lesions identified. CT ABDOMEN PELVIS FINDINGS Hepatobiliary: No focal liver abnormality is seen, although evaluation of the liver is greatly limited due to artifact. Gallbladder is grossly normal. No evidence of biliary ductal dilation. Pancreas: Unremarkable. Spleen: Normal in size without focal abnormality. Adrenals/Urinary Tract: Bilateral adrenal glands are unremarkable. No hydronephrosis. Nonobstructing left renal stone. Bilateral low-attenuation renal lesions which are likely simple cysts, artifact limits evaluation, no specific follow-up imaging is necessary. Bladder wall thickening. Stomach/Bowel: Stomach is within normal limits. No definite bowel wall thickening, although evaluation of the bowel loops is markedly limited. No evidence of obstruction. Vascular/Lymphatic: Aortic atherosclerosis. Mildly enlarged periaortic lymph node measuring 12 mm in short axis on series 3, image 75. Bilateral enlarged inguinal lymph nodes. Reference left inguinal lymph node measuring 1.6 mm in short axis on series 3, image 127. Reproductive: Prostate is unremarkable. Other: Diffuse body wall edema. Small fat containing periumbilical hernia. Diffuse mesenteric stranding and trace abdominopelvic ascites. Musculoskeletal: No acute or significant osseous findings. IMPRESSION: 1. Cardiomegaly and moderate pericardial effusion. 2. Small left-greater-than-right pleural effusions and atelectasis. 3. Evaluation of the abdomen and pelvis is markedly limited due to streak artifact and diffuse anasarca. 4. Diffuse mesenteric stranding and trace abdominopelvic ascites, possibly  related to volume status. 5. Bladder wall thickening, findings can be seen in the setting of cystitis. Correlate with urinalysis. 6. Enlarged periaortic and bilateral inguinal lymph nodes, possibly reactive. Consider follow-up CT of the abdomen and pelvis in 3 months to ensure resolution. 7. Severe coronary artery calcifications and aortic Atherosclerosis (ICD10-I70.0). Electronically Signed   By: Allegra Lai M.D.   On: 10/25/2022 18:41   DG Chest Port 1 View  Result Date: 10/25/2022  CLINICAL DATA:  Shortness of breath EXAM: PORTABLE CHEST 1 VIEW COMPARISON:  X-ray 04/14/2009 FINDINGS: Enlarged cardiopericardial silhouette. Calcified aorta. Prominent central vasculature. Tiny left effusion. Mild lung base opacities. Overlapping cardiac leads. IMPRESSION: Enlarged cardiopericardial silhouette. Tiny left effusion. Mild lung base opacities, left-greater-than-right. Subtle infiltrates possible. Recommend follow-up. Electronically Signed   By: Karen Kays M.D.   On: 10/25/2022 15:21      Signature  -   Susa Raring M.D on 10/27/2022 at 9:41 AM   -  To page go to [email or web address]

## 2022-10-27 NOTE — Progress Notes (Addendum)
   Patient Name: Mason Hill Date of Encounter: 10/27/2022 [facility] Cardiologist:  Dr. Herbie Baltimore  Interval Summary  .    Chronic lower extremity edema fullness abdomen bloating, no chest pain  Vital Signs .    Vitals:   10/26/22 1951 10/27/22 0000 10/27/22 0500 10/27/22 0808  BP: 136/66 (!) 108/51  130/62  Pulse: 96 94  (!) 107  Resp: 18 20    Temp:  98.8 F (37.1 C)  98.9 F (37.2 C)  TempSrc:  Oral  Oral  SpO2: 95% 94%  91%  Weight:   106.7 kg   Height:        Intake/Output Summary (Last 24 hours) at 10/27/2022 1056 Last data filed at 10/27/2022 1042 Gross per 24 hour  Intake 724.08 ml  Output 1950 ml  Net -1225.92 ml      10/27/2022    5:00 AM 10/26/2022   11:11 AM 10/25/2022    2:26 PM  Last 3 Weights  Weight (lbs) 235 lb 3.7 oz 231 lb 14.4 oz 200 lb  Weight (kg) 106.7 kg 105.189 kg 90.719 kg      Telemetry/ECG    Atrial fibrillation heart rates mostly in 70s to 100s- Personally Reviewed  Troponin 2 50-3 50  Physical Exam .   GEN: No acute distress.   Neck: Positive JVD Cardiac: Irregular irregular  no murmurs, rubs, or gallops.  Respiratory: Decreased at bases. GI: Soft, nontender, non-distended  MS: Plus lower extremity edema  Assessment & Plan .     82 year old with moderate pericardial effusion anasarca  Pericardial effusion - Moderate in size.  No tamponade.  Correlates with underlying anasarca low albumin.  Continue to treat underlying condition.  Anasarca - Lasix Aldactone TED hose protein supplementation.  Albumin 1.8  CAD-aspirin 81 Elevated troponin 250 - 350 - No current chest pain.  Minimal ST depression in inferior leads..  Echocardiogram showed EF of 65% right ventricular size is moderately enlarged with pulmonary pressures of 43 mmHg. -Elevated troponin likely in the setting of underlying medical illness  Paroxysmal atrial fibrillation - Low-dose beta-blocker.  Metoprolol 25 twice daily  Hyperlipidemia - On  atorvastatin 40 mg a day  Chronic anticoagulation - Eliquis.  Plan on TEE to exclude vegetation.   For questions or updates, please contact  [facility] Please consult [email or web address] for contact info under        Signed, Donato Schultz, MD

## 2022-10-27 NOTE — Plan of Care (Signed)
Pt has rested quietly throughout the night with no distress noted. Pt to BR with assist, had BM. Alert and oriented. Atrial fib on the monitor. No complaints voiced. On a 2liter fluid restriction.    Problem: Education: Goal: Ability to describe self-care measures that may prevent or decrease complications (Diabetes Survival Skills Education) will improve Outcome: Progressing Goal: Individualized Educational Video(s) Outcome: Progressing   Problem: Coping: Goal: Ability to adjust to condition or change in health will improve Outcome: Progressing   Problem: Fluid Volume: Goal: Ability to maintain a balanced intake and output will improve Outcome: Progressing   Problem: Health Behavior/Discharge Planning: Goal: Ability to identify and utilize available resources and services will improve Outcome: Progressing Goal: Ability to manage health-related needs will improve Outcome: Progressing   Problem: Metabolic: Goal: Ability to maintain appropriate glucose levels will improve Outcome: Progressing   Problem: Nutritional: Goal: Maintenance of adequate nutrition will improve Outcome: Progressing Goal: Progress toward achieving an optimal weight will improve Outcome: Progressing   Problem: Skin Integrity: Goal: Risk for impaired skin integrity will decrease Outcome: Progressing   Problem: Tissue Perfusion: Goal: Adequacy of tissue perfusion will improve Outcome: Progressing   Problem: Education: Goal: Knowledge of General Education information will improve Description: Including pain rating scale, medication(s)/side effects and non-pharmacologic comfort measures Outcome: Progressing   Problem: Health Behavior/Discharge Planning: Goal: Ability to manage health-related needs will improve Outcome: Progressing   Problem: Clinical Measurements: Goal: Ability to maintain clinical measurements within normal limits will improve Outcome: Progressing Goal: Will remain free from  infection Outcome: Progressing Goal: Diagnostic test results will improve Outcome: Progressing Goal: Respiratory complications will improve Outcome: Progressing Goal: Cardiovascular complication will be avoided Outcome: Progressing   Problem: Activity: Goal: Risk for activity intolerance will decrease Outcome: Progressing   Problem: Nutrition: Goal: Adequate nutrition will be maintained Outcome: Progressing   Problem: Coping: Goal: Level of anxiety will decrease Outcome: Progressing   Problem: Elimination: Goal: Will not experience complications related to bowel motility Outcome: Progressing Goal: Will not experience complications related to urinary retention Outcome: Progressing   Problem: Pain Managment: Goal: General experience of comfort will improve Outcome: Progressing   Problem: Safety: Goal: Ability to remain free from injury will improve Outcome: Progressing   Problem: Skin Integrity: Goal: Risk for impaired skin integrity will decrease Outcome: Progressing

## 2022-10-27 NOTE — Progress Notes (Signed)
SATURATION QUALIFICATIONS: (This note is used to comply with regulatory documentation for home oxygen)  Patient Saturations on Room Air at Rest = 94%  Patient Saturations on Room Air while Ambulating = 88% with no dyspnea or incr WOB.   Did not require supplemental oxygen for ambulation.    Jerolyn Center, PT Acute Rehabilitation Services  Office [phone]

## 2022-10-27 NOTE — Evaluation (Signed)
Physical Therapy Brief Evaluation and Discharge Note Patient Details Name: Mason Hill MRN: [id number] DOB: 1940-07-28 Today's Date: 10/27/2022   History of Present Illness  82 y.o. male presented to emergency department 10/25/22 with complaining of midepigastric pain. +sepsis due to pna; pericardial effusion; elevated troponins with demand ischemia; anasarca  PMH significant of essential hypertension, CKD stage IIIa, hyperlipidemia, diabetes type 2, congestive heart failure, paroxysmal atrial fibrillation on Eliquis, CAD  Clinical Impression   Patient evaluated by Physical Therapy with no further acute PT needs identified. Patient able to ambulate 300 ft independently on RA with lowest oxygen saturation 88%. All education has been completed and the patient has no further questions. (Educated on elevating legs to assist with edema reduction--pt had been sitting with feet on the floor for about an hour). PT is signing off. Thank you for this referral.        PT Assessment Patient does not need any further PT services  Assistance Needed at Discharge  None    Equipment Recommendations None recommended by PT  Recommendations for Other Services       Precautions/Restrictions Precautions Precautions: None        Mobility  Bed Mobility       General bed mobility comments: up in recliner  Transfers Overall transfer level: Independent Equipment used: None                    Ambulation/Gait Ambulation/Gait assistance: Independent Gait Distance (Feet): 300 Feet Assistive device: None Gait Pattern/deviations: Step-through pattern, Wide base of support Gait Speed: Pace WFL General Gait Details: wide stance in part due to bil LE edema, although pt states he is not far from baseline  Home Activity Instructions    Stairs            Modified Rankin (Stroke Patients Only)        Balance Overall balance assessment: Independent                         Pertinent Vitals/Pain PT - Brief Vital Signs All Vital Signs Stable: Yes (see ambulatory oxygen saturation note (separate)) Pain Assessment Pain Assessment: No/denies pain     Home Living Family/patient expects to be discharged to:: Private residence Living Arrangements: Other relatives (grandson) Available Help at Discharge: Family;Available PRN/intermittently Home Environment: Stairs to enter;Rail - right;Rail - left  Stairs-Number of Steps: 3 Home Equipment: Rolling Walker (2 wheels);Cane - single point;BSC/3in1   Additional Comments: DME was his wifes (now deceased) and he does not use    Prior Function Level of Independence: Independent      UE/LE Assessment   UE ROM/Strength/Tone/Coordination: WFL    LE ROM/Strength/Tone/Coordination: [facility]      Communication   Communication Communication: No apparent difficulties     Cognition Overall Cognitive Status: Appears within functional limits for tasks assessed/performed       General Comments      Exercises     Assessment/Plan    PT Problem List         PT Visit Diagnosis Difficulty in walking, not elsewhere classified (R26.2)    No Skilled PT Patient at baseline level of functioning;Patient is independent with all acitivity/mobility   Co-evaluation                AMPAC 6 Clicks Help needed turning from your back to your side while in a flat bed without using bedrails?: None Help needed moving from lying on your back  to sitting on the side of a flat bed without using bedrails?: None Help needed moving to and from a bed to a chair (including a wheelchair)?: None Help needed standing up from a chair using your arms (e.g., wheelchair or bedside chair)?: None Help needed to walk in hospital room?: None Help needed climbing 3-5 steps with a railing? : None 6 Click Score: 24      End of Session   Activity Tolerance: Patient tolerated treatment well Patient left: in chair;with call bell/phone  within reach Nurse Communication: Mobility status;Other (comment) (ambulatory sats done) PT Visit Diagnosis: Difficulty in walking, not elsewhere classified (R26.2)     Time: 7829-5621 PT Time Calculation (min) (ACUTE ONLY): 15 min  Charges:   PT Evaluation $PT Eval Low Complexity: 1 Low       Jerolyn Center, PT Acute Rehabilitation Services  Office [phone]   Zena Amos  10/27/2022, 1:50 PM

## 2022-10-28 DIAGNOSIS — A419 Sepsis, unspecified organism: Secondary | ICD-10-CM | POA: Diagnosis not present

## 2022-10-28 DIAGNOSIS — R6520 Severe sepsis without septic shock: Secondary | ICD-10-CM | POA: Diagnosis not present

## 2022-10-28 LAB — CBC
HCT: 32 % — ABNORMAL LOW (ref 39.0–52.0)
Hemoglobin: 10.4 g/dL — ABNORMAL LOW (ref 13.0–17.0)
MCH: 31.3 pg (ref 26.0–34.0)
MCHC: 32.5 g/dL (ref 30.0–36.0)
MCV: 96.4 fL (ref 80.0–100.0)
Platelets: 133 10*3/uL — ABNORMAL LOW (ref 150–400)
RBC: 3.32 MIL/uL — ABNORMAL LOW (ref 4.22–5.81)
RDW: 14 % (ref 11.5–15.5)
WBC: 12.8 10*3/uL — ABNORMAL HIGH (ref 4.0–10.5)
nRBC: 0 % (ref 0.0–0.2)

## 2022-10-28 LAB — COMPREHENSIVE METABOLIC PANEL
ALT: 13 U/L (ref 0–44)
AST: 26 U/L (ref 15–41)
Albumin: 1.6 g/dL — ABNORMAL LOW (ref 3.5–5.0)
Alkaline Phosphatase: 83 U/L (ref 38–126)
Anion gap: 8 (ref 5–15)
BUN: 26 mg/dL — ABNORMAL HIGH (ref 8–23)
CO2: 30 mmol/L (ref 22–32)
Calcium: 8 mg/dL — ABNORMAL LOW (ref 8.9–10.3)
Chloride: 101 mmol/L (ref 98–111)
Creatinine, Ser: 1.14 mg/dL (ref 0.61–1.24)
GFR, Estimated: >60 mL/min (ref >60)
Glucose, Bld: 115 mg/dL — ABNORMAL HIGH (ref 70–99)
Potassium: 3.9 mmol/L (ref 3.5–5.1)
Sodium: 139 mmol/L (ref 135–145)
Total Bilirubin: 0.6 mg/dL (ref 0.3–1.2)
Total Protein: 4.4 g/dL — ABNORMAL LOW (ref 6.5–8.1)

## 2022-10-28 LAB — CULTURE, BLOOD (ROUTINE X 2): Special Requests: ADEQUATE

## 2022-10-28 LAB — PROCALCITONIN: Procalcitonin: 6.54 ng/mL

## 2022-10-28 LAB — GLUCOSE, CAPILLARY
Glucose-Capillary: 116 mg/dL — ABNORMAL HIGH (ref 70–99)
Glucose-Capillary: 157 mg/dL — ABNORMAL HIGH (ref 70–99)
Glucose-Capillary: 181 mg/dL — ABNORMAL HIGH (ref 70–99)
Glucose-Capillary: 178 mg/dL — ABNORMAL HIGH (ref 70–99)

## 2022-10-28 LAB — MAGNESIUM: Magnesium: 1.8 mg/dL (ref 1.7–2.4)

## 2022-10-28 LAB — C-REACTIVE PROTEIN: CRP: 19.3 mg/dL — ABNORMAL HIGH (ref <1.0)

## 2022-10-28 MED ORDER — SPIRONOLACTONE 25 MG PO TABS
25.0000 mg | ORAL_TABLET | Freq: Every day | ORAL | Status: DC
  Administered 2022-10-28 – 2022-10-31 (×4): 25 mg via ORAL
  Filled 2022-10-28 (×4): qty 1

## 2022-10-28 MED ORDER — POTASSIUM CHLORIDE CRYS ER 20 MEQ PO TBCR
20.0000 meq | EXTENDED_RELEASE_TABLET | Freq: Once | ORAL | Status: AC
  Administered 2022-10-28: 20 meq via ORAL
  Filled 2022-10-28: qty 1

## 2022-10-28 NOTE — H&P (View-Only) (Signed)
   Plan for TEE to rule out endocarditis on Tuesday.  Currently afebrile.  On IV penicillin.  Anasarca/pericardial effusion - Continue with protein addition and dietary -Lasix spironolactone -No indication for pericardiocentesis  PAF - Eliquis low-dose beta-blocker  Donato Schultz, MD

## 2022-10-28 NOTE — Plan of Care (Signed)
Pt has rested quietly throughout the night with no distress noted. Alert and oriented. On room air. Atrial fib on the monitor and SR 1AVB. On 2 liter fluid restriction. No complaints voiced.    Problem: Education: Goal: Ability to describe self-care measures that may prevent or decrease complications (Diabetes Survival Skills Education) will improve Outcome: Progressing Goal: Individualized Educational Video(s) Outcome: Progressing   Problem: Coping: Goal: Ability to adjust to condition or change in health will improve Outcome: Progressing   Problem: Fluid Volume: Goal: Ability to maintain a balanced intake and output will improve Outcome: Progressing   Problem: Health Behavior/Discharge Planning: Goal: Ability to identify and utilize available resources and services will improve Outcome: Progressing Goal: Ability to manage health-related needs will improve Outcome: Progressing   Problem: Metabolic: Goal: Ability to maintain appropriate glucose levels will improve Outcome: Progressing   Problem: Nutritional: Goal: Maintenance of adequate nutrition will improve Outcome: Progressing Goal: Progress toward achieving an optimal weight will improve Outcome: Progressing   Problem: Skin Integrity: Goal: Risk for impaired skin integrity will decrease Outcome: Progressing   Problem: Tissue Perfusion: Goal: Adequacy of tissue perfusion will improve Outcome: Progressing   Problem: Education: Goal: Knowledge of General Education information will improve Description: Including pain rating scale, medication(s)/side effects and non-pharmacologic comfort measures Outcome: Progressing   Problem: Health Behavior/Discharge Planning: Goal: Ability to manage health-related needs will improve Outcome: Progressing   Problem: Clinical Measurements: Goal: Ability to maintain clinical measurements within normal limits will improve Outcome: Progressing Goal: Will remain free from  infection Outcome: Progressing Goal: Diagnostic test results will improve Outcome: Progressing Goal: Respiratory complications will improve Outcome: Progressing Goal: Cardiovascular complication will be avoided Outcome: Progressing   Problem: Activity: Goal: Risk for activity intolerance will decrease Outcome: Progressing   Problem: Nutrition: Goal: Adequate nutrition will be maintained Outcome: Progressing   Problem: Coping: Goal: Level of anxiety will decrease Outcome: Progressing   Problem: Elimination: Goal: Will not experience complications related to bowel motility Outcome: Progressing Goal: Will not experience complications related to urinary retention Outcome: Progressing   Problem: Pain Managment: Goal: General experience of comfort will improve Outcome: Progressing   Problem: Safety: Goal: Ability to remain free from injury will improve Outcome: Progressing   Problem: Skin Integrity: Goal: Risk for impaired skin integrity will decrease Outcome: Progressing

## 2022-10-28 NOTE — Progress Notes (Signed)
ID PROGRESS NOTE  82yo M with group b strep bacteremia  Afebrile Leukocytosis improving Repeat blood cx NGTD at 48hrs  Continue with plan for TEE to rule out endocarditis on Tuesday Will continue on penicillin IV to treat bacteremia  Mason Hill Second MD MPH [facility] [phone]

## 2022-10-28 NOTE — Progress Notes (Signed)
PROGRESS NOTE                                                                                                                                                                                                             Patient Demographics:    Mason Hill, is a 82 y.o. male, DOB - 10-25-1940, NWG:[id number]  Outpatient Primary MD for the patient is Shapely-Quinn, Desiree Lucy, MD    LOS - 3  Admit date - 10/25/2022    Chief Complaint  Patient presents with   Chest Pain       Brief Narrative (HPI from H&P)   82 year old male with history of hypertension, CKD stage III, hyperlipidemia, diabetes type 2, chronic diastolic CHF, paroxysmal A-fib, coronary artery disease, hypertension, hyperlipidemia, peripheral artery disease require intervention who initially presented with midepigastric pain, chest pain, abdominal pain, dry cough, progressive worsening of bilateral lower extremity swelling, dyspnea, orthopnea.  On presentation, blood pressure was soft, had mild fever but saturating fine on room air.  Respiratory viral panel came out of negative.  Lab work showed lactic acid of 2.5, potassium of 3.3, creatinine of 1.6, WBC count of 16.4, elevated BNP of 664, elevated troponin.  EKG showed A-fib.  Chest x-ray showed enlarged cardiac shadow, mid lung base opacity left greater than right.  CT abdomen/pelvis showed cardiomegaly, moderate pericardial effusion.  Patient was admitted for the management of possible sepsis secondary to pneumonia, +++ edema,  Cardiology ,ID also consulted.    Subjective:   Patient in bed, appears comfortable, denies any headache, no fever, no chest pain or pressure, no shortness of breath , no abdominal pain. No new focal weakness.   Assessment  & Plan :    Community-acquired pneumonia, strep agalactiae bacteremia:Chest x-ray showed possible bilateral pneumonia left greater than right.  Blood culture positive  for strep agalactiae, appreciate ID input, TTE shows no vegetation, cardiology requested to arrange for TEE on 10/30/22, continue present antibiotics as directed by ID and monitor.  Repeat blood cultures will be monitored.   Enlarged periaortic/bilateral inguinal lymph nodes: Likely reactive.  Recommend follow-up CT abdomen/pelvis in 3 months, cardiology on board.   Anasarca: Has low albumin.  Likely from moderate protein calorie malnutrition, UA has no proteinuria, LFTs are stable, on protein supplementation and diuresis with Lasix and Aldactone, add TED stockings as well.  Pericardial effusion: Noted on echocardiogram, moderate, cardiology on board.   Coronary artery disease/elevated troponin: Elevated with flat trend.  Complaint of chest pain on admission which has resolved.  Currently chest pain-free.  EKG showed A-fib, PVCs, minimal ST depression in the inferior leads.  Elevated troponin could be from demand ischemia.  Echocardiogram ordered.  Takes Lipitor at home.  Cardiology following   Paroxysmal A-fib: On Eliquis for anticoagulation, and low-dose beta-blocker, cardiology following.  Monitor on telemetry.  Currently in normal sinus rhythm   Chronic diastolic CHF with exacerbation: Takes amlodipine, losartan, Jardiance at home.  Presented with anasarca, elevated BNP.  Chest x-ray showing cardiomegaly.  Continue diuretics, cardiology on board.   Hypertension: Currently antihypertensives on hold.  On losartan, spironolactone, Jardiance, Lasix at home.   Hypokalemia/hypomagnesemia: Currently being monitored and supplemented as needed.   Debility/deconditioning: Patient lives with his grandson.  Walks fine at home.  Continue PT.  Diabetes type 2: A1c of 6.  Jardiance at home.  Currently on sliding scale   Lab Results  Component Value Date   HGBA1C 6.0 (H) 10/26/2022   CBG (last 3)  Recent Labs    10/27/22 1647 10/27/22 2105 10/28/22 0809  GLUCAP 125* 149* 116*           Condition -  Guarded  Family Communication  :  None  Code Status :  Full  Consults  :  Cards, ID  PUD Prophylaxis :     Procedures  :     TEE  CT chest abdomen and pelvis- 1. Cardiomegaly and moderate pericardial effusion. 2. Small left-greater-than-right pleural effusions and atelectasis. 3. Evaluation of the abdomen and pelvis is markedly limited due to streak artifact and diffuse anasarca. 4. Diffuse mesenteric stranding and trace abdominopelvic ascites, possibly related to volume status. 5. Bladder wall thickening, findings can be seen in the setting of cystitis. Correlate with urinalysis. 6. Enlarged periaortic and bilateral inguinal lymph nodes, possibly reactive. Consider follow-up CT of the abdomen and pelvis in 3 months to ensure resolution. 7. Severe coronary artery calcifications and aortic Atherosclerosis (ICD10-I70.0).    TTE -  1. Left ventricular ejection fraction, by estimation, is 60 to 65%. Left ventricular ejection fraction by PLAX is 62 %. The left ventricle has normal function. The left ventricle has no regional wall motion abnormalities. Left ventricular diastolic parameters are indeterminate. There is the interventricular septum is flattened in systole, consistent with right ventricular pressure overload.  2. Right ventricular systolic function is severely reduced. The right ventricular size is moderately enlarged. There is mildly elevated pulmonary artery systolic pressure. The estimated right ventricular systolic pressure is 42.7 mmHg.  3. Right atrial size was severely dilated.  4. Pericardial fluid measures 1.36 cm over the RV apex and up to 2.29 cm around the right atrium.. Moderate pericardial effusion. The pericardial effusion is anterior to the right ventricle.  5. The mitral valve is degenerative. Trivial mitral valve regurgitation.  6. The aortic valve is tricuspid. Aortic valve regurgitation is not visualized.  7. Aortic dilatation noted. There is borderline  dilatation of the aortic root, measuring 39 mm.  8. The inferior vena cava is dilated in size with <50% respiratory variability, suggesting right atrial pressure of 15 mmHg.      Disposition Plan  :    Status is: Inpatient   DVT Prophylaxis  :    Place TED hose Start: 10/27/22 0939 SCDs Start: 10/25/22 1934 apixaban (ELIQUIS) tablet 5 mg     Lab Results  Component Value Date   PLT 133 (L) 10/28/2022    Diet :  Diet Order             Diet NPO time specified Except for: Sips with Meds  Diet effective now           Diet regular Room service appropriate? Yes with Assist; Fluid consistency: Thin; Fluid restriction: 1500 mL Fluid  Diet effective now                    Inpatient Medications  Scheduled Meds:  (feeding supplement) PROSource Plus  30 mL Oral BID BM   apixaban  5 mg Oral BID   aspirin EC  81 mg Oral Daily   atorvastatin  40 mg Oral Daily   feeding supplement  237 mL Oral BID BM   furosemide  60 mg Intravenous BID   insulin aspart  0-5 Units Subcutaneous QHS   insulin aspart  0-6 Units Subcutaneous TID WC   lactose free nutrition  237 mL Oral BID BM   metoprolol tartrate  25 mg Oral BID   multivitamin with minerals  1 tablet Oral Daily   sodium chloride flush  3 mL Intravenous Q12H   Continuous Infusions:  sodium chloride     penicillin G potassium 12 Million Units in dextrose 5 % 500 mL CONTINUOUS infusion 12 Million Units (10/27/22 2226)   PRN Meds:.sodium chloride, acetaminophen **OR** acetaminophen, melatonin, ondansetron **OR** ondansetron (ZOFRAN) IV, sodium chloride flush    Objective:   Vitals:   10/28/22 0200 10/28/22 0300 10/28/22 0400 10/28/22 0809  BP:   (!) 108/56 125/83  Pulse: 68  68 70  Resp:   18 18  Temp:   98.3 F (36.8 C) 98.4 F (36.9 C)  TempSrc:   Oral Oral  SpO2: 99%  98% 98%  Weight:  103.8 kg    Height:        Wt Readings from Last 3 Encounters:  10/28/22 103.8 kg  07/17/19 91.2 kg     Intake/Output  Summary (Last 24 hours) at 10/28/2022 0953 Last data filed at 10/28/2022 0810 Gross per 24 hour  Intake 938 ml  Output 3900 ml  Net -2962 ml     Physical Exam  Awake Alert, No new F.N deficits, Normal affect [place].AT,PERRAL Supple Neck, No JVD,   Symmetrical Chest wall movement, Good air movement bilaterally, CTAB RRR,No Gallops,Rubs or new Murmurs,  +ve B.Sounds, Abd Soft, No tenderness,   2+ edema      Data Review:    Recent Labs  Lab 10/25/22 1451 10/26/22 0028 10/27/22 0650 10/28/22 0334  WBC 16.4* 21.7* 16.8* 12.8*  HGB 11.2* 11.8* 10.6* 10.4*  HCT 35.0* 36.9* 32.5* 32.0*  PLT 174 149* 131* 133*  MCV 94.9 95.3 94.2 96.4  MCH 30.4 30.5 30.7 31.3  MCHC 32.0 32.0 32.6 32.5  RDW 13.7 13.9 14.2 14.0  LYMPHSABS 0.3*  --   --   --   MONOABS 1.6*  --   --   --   EOSABS 0.0  --   --   --   BASOSABS 0.1  --   --   --     Recent Labs  Lab 10/25/22 1451 10/25/22 1500 10/25/22 1515 10/25/22 1723 10/25/22 1941 10/26/22 0028 10/26/22 0544 10/27/22 0650 10/28/22 0334  NA 141  --   --   --   --  140  --  139 139  K 3.3*  --   --   --   --  4.1  --  3.7 3.9  CL 106  --   --   --   --  101  --  100 101  CO2 27  --   --   --   --  26  --  28 30  ANIONGAP 8  --   --   --   --  13  --  11 8  GLUCOSE 105*  --   --   --   --  119*  --  127* 115*  BUN 18  --   --   --   --  21  --  24* 26*  CREATININE 1.16  --   --   --   --  1.15  --  1.07 1.14  AST 25  --   --   --   --  25  --  21 26  ALT 13  --   --   --   --  13  --  14 13  ALKPHOS 46  --   --   --   --  46  --  68 83  BILITOT 1.4*  --   --   --   --  1.0  --  0.7 0.6  ALBUMIN 1.9*  --   --   --   --  2.3*  --  1.8* 1.6*  CRP  --   --   --   --   --   --   --  25.0* 19.3*  PROCALCITON  --   --   --   --   --  13.52  --  9.14 6.54  LATICACIDVEN  --   --  2.5* 3.5* 3.1*  --  1.8  --   --   INR 1.2  --   --   --   --   --   --   --   --   HGBA1C  --   --   --   --   --  6.0*  --   --   --   BNP  --  684.8*  --   --   --    --   --   --   --   MG  --   --   --   --   --  1.5*  --  1.8 1.8  CALCIUM 7.7*  --   --   --   --  8.1*  --  8.2* 8.0*      Recent Labs  Lab 10/25/22 1451 10/25/22 1500 10/25/22 1515 10/25/22 1723 10/25/22 1941 10/26/22 0028 10/26/22 0544 10/27/22 0650 10/28/22 0334  CRP  --   --   --   --   --   --   --  25.0* 19.3*  PROCALCITON  --   --   --   --   --  13.52  --  9.14 6.54  LATICACIDVEN  --   --  2.5* 3.5* 3.1*  --  1.8  --   --   INR 1.2  --   --   --   --   --   --   --   --   HGBA1C  --   --   --   --   --  6.0*  --   --   --   BNP  --  684.8*  --   --   --   --   --   --   --  MG  --   --   --   --   --  1.5*  --  1.8 1.8  CALCIUM 7.7*  --   --   --   --  8.1*  --  8.2* 8.0*    --------------------------------------------------------------------------------------------------------------- Lab Results  Component Value Date   CHOL 143 10/27/2022   HDL 42 10/27/2022   LDLCALC 82 10/27/2022   TRIG 95 10/27/2022   CHOLHDL 3.4 10/27/2022    Lab Results  Component Value Date   HGBA1C 6.0 (H) 10/26/2022     Radiology Reports DG Chest Port 1 View  Result Date: 10/27/2022 CLINICAL DATA:  82 year old male shortness of breath. EXAM: PORTABLE CHEST 1 VIEW COMPARISON:  CT Chest, Abdomen, and Pelvis 10/25/2022 and earlier. FINDINGS: Portable AP semi upright view at 0637 hours. Cardiomegaly. Other mediastinal contours are within normal limits. Visualized tracheal air column is within normal limits. Retrocardiac opacity is stable, with left pleural effusion and atelectasis recently demonstrated on CT. No pneumothorax or pulmonary edema. Stable right lung ventilation. No acute osseous abnormality identified. IMPRESSION: Stable cardiomegaly and left pleural effusion with atelectasis. Electronically Signed   By: Odessa Fleming M.D.   On: 10/27/2022 07:00   ECHOCARDIOGRAM COMPLETE  Result Date: 10/26/2022    ECHOCARDIOGRAM REPORT   Patient Name:   Mason Hill Date of Exam: 10/26/2022  Medical Rec #:  [id number]    Height:       72.0 in Accession #:    [id number]   Weight:       200.0 lb Date of Birth:  1940-09-02    BSA:          2.131 m Patient Age:    82 years     BP:           122/55 mmHg Patient Gender: M            HR:           86 bpm. Exam Location:  Inpatient Procedure: 2D Echo, Cardiac Doppler and Color Doppler Indications:    NSTEMI I21.4  History:        Patient has no prior history of Echocardiogram examinations.  Sonographer:    Harriette Bouillon RDCS Referring Phys: [phone] SUBRINA SUNDIL IMPRESSIONS  1. Left ventricular ejection fraction, by estimation, is 60 to 65%. Left ventricular ejection fraction by PLAX is 62 %. The left ventricle has normal function. The left ventricle has no regional wall motion abnormalities. Left ventricular diastolic parameters are indeterminate. There is the interventricular septum is flattened in systole, consistent with right ventricular pressure overload.  2. Right ventricular systolic function is severely reduced. The right ventricular size is moderately enlarged. There is mildly elevated pulmonary artery systolic pressure. The estimated right ventricular systolic pressure is 42.7 mmHg.  3. Right atrial size was severely dilated.  4. Pericardial fluid measures 1.36 cm over the RV apex and up to 2.29 cm around the right atrium.. Moderate pericardial effusion. The pericardial effusion is anterior to the right ventricle.  5. The mitral valve is degenerative. Trivial mitral valve regurgitation.  6. The aortic valve is tricuspid. Aortic valve regurgitation is not visualized.  7. Aortic dilatation noted. There is borderline dilatation of the aortic root, measuring 39 mm.  8. The inferior vena cava is dilated in size with <50% respiratory variability, suggesting right atrial pressure of 15 mmHg. Comparison(s): No prior Echocardiogram. Conclusion(s)/Recommendation(s): Moderate sized pericardial effusion. Cannot exclude tamponade physiology given severe RV  dysfunction. Clinical correlation is advised.  FINDINGS  Left Ventricle: Left ventricular ejection fraction, by estimation, is 60 to 65%. Left ventricular ejection fraction by PLAX is 62 %. The left ventricle has normal function. The left ventricle has no regional wall motion abnormalities. The left ventricular internal cavity size was normal in size. There is no left ventricular hypertrophy. The interventricular septum is flattened in systole, consistent with right ventricular pressure overload. Left ventricular diastolic parameters are indeterminate. Right Ventricle: The right ventricular size is moderately enlarged. No increase in right ventricular wall thickness. Right ventricular systolic function is severely reduced. There is mildly elevated pulmonary artery systolic pressure. The tricuspid regurgitant velocity is 2.63 m/s, and with an assumed right atrial pressure of 15 mmHg, the estimated right ventricular systolic pressure is 42.7 mmHg. Left Atrium: Left atrial size was normal in size. Right Atrium: Right atrial size was severely dilated. Pericardium: Pericardial fluid measures 1.36 cm over the RV apex and up to 2.29 cm around the right atrium. A moderately sized pericardial effusion is present. The pericardial effusion is anterior to the right ventricle. Mitral Valve: The mitral valve is degenerative in appearance. There is mild calcification of the anterior and posterior mitral valve leaflet(s). Mild to moderate mitral annular calcification. Trivial mitral valve regurgitation. Tricuspid Valve: The tricuspid valve is grossly normal. Tricuspid valve regurgitation is mild. Aortic Valve: The aortic valve is tricuspid. Aortic valve regurgitation is not visualized. Pulmonic Valve: The pulmonic valve was grossly normal. Pulmonic valve regurgitation is trivial. Aorta: Aortic dilatation noted. There is borderline dilatation of the aortic root, measuring 39 mm. Venous: The inferior vena cava is dilated in size with  less than 50% respiratory variability, suggesting right atrial pressure of 15 mmHg. IAS/Shunts: No atrial level shunt detected by color flow Doppler.  LEFT VENTRICLE PLAX 2D LV EF:         Left            Diastology                ventricular     LV e' medial:    5.77 cm/s                ejection        LV E/e' medial:  18.7                fraction by     LV e' lateral:   8.05 cm/s                PLAX is 62      LV E/e' lateral: 13.4                %. LVIDd:         5.30 cm LVIDs:         3.50 cm LV PW:         1.00 cm LV IVS:        1.00 cm LVOT diam:     2.20 cm LV SV:         76 LV SV Index:   36 LVOT Area:     3.80 cm  RIGHT VENTRICLE         IVC TAPSE (M-mode): 0.6 cm  IVC diam: 3.80 cm LEFT ATRIUM             Index        RIGHT ATRIUM           Index LA diam:        4.40 cm  2.07 cm/m   RA Area:     27.50 cm LA Vol (A2C):   50.1 ml 23.51 ml/m  RA Volume:   101.00 ml 47.41 ml/m LA Vol (A4C):   26.2 ml 12.30 ml/m LA Biplane Vol: 38.8 ml 18.21 ml/m  AORTIC VALVE LVOT Vmax:   102.00 cm/s LVOT Vmean:  72.200 cm/s LVOT VTI:    0.201 m  AORTA Ao Root diam: 3.90 cm Ao Asc diam:  3.50 cm MITRAL VALVE                TRICUSPID VALVE MV Area (PHT): 4.77 cm     TR Peak grad:   27.7 mmHg MV Decel Time: 159 msec     TR Vmax:        263.00 cm/s MV E velocity: 108.00 cm/s                             SHUNTS                             Systemic VTI:  0.20 m                             Systemic Diam: 2.20 cm Zoila Shutter MD Electronically signed by Zoila Shutter MD Signature Date/Time: 10/26/2022/2:41:21 PM    Final    CT CHEST ABDOMEN PELVIS W CONTRAST  Result Date: 10/25/2022 CLINICAL DATA:  Chest pain EXAM: CT CHEST, ABDOMEN, AND PELVIS WITH CONTRAST TECHNIQUE: Multidetector CT imaging of the chest, abdomen and pelvis was performed following the standard protocol during bolus administration of intravenous contrast. RADIATION DOSE REDUCTION: This exam was performed according to the departmental dose-optimization  program which includes automated exposure control, adjustment of the mA and/or kV according to patient size and/or use of iterative reconstruction technique. CONTRAST:  75mL OMNIPAQUE IOHEXOL 350 MG/ML SOLN COMPARISON:  None Available. FINDINGS: Limited exam due to anasarca and streak artifact related to patient arm position. CT CHEST FINDINGS Cardiovascular: Cardiomegaly. Moderate pericardial effusion. Caliber thoracic aorta with moderate atherosclerotic disease. Severe coronary artery calcifications. Mediastinum/Nodes: Esophagus and thyroid are unremarkable. No enlarged lymph nodes seen in the chest. Lungs/Pleura: Central airways are patent. Small left-greater-than-right pleural effusions and atelectasis. No pneumothorax. Musculoskeletal: No chest wall mass or suspicious bone lesions identified. CT ABDOMEN PELVIS FINDINGS Hepatobiliary: No focal liver abnormality is seen, although evaluation of the liver is greatly limited due to artifact. Gallbladder is grossly normal. No evidence of biliary ductal dilation. Pancreas: Unremarkable. Spleen: Normal in size without focal abnormality. Adrenals/Urinary Tract: Bilateral adrenal glands are unremarkable. No hydronephrosis. Nonobstructing left renal stone. Bilateral low-attenuation renal lesions which are likely simple cysts, artifact limits evaluation, no specific follow-up imaging is necessary. Bladder wall thickening. Stomach/Bowel: Stomach is within normal limits. No definite bowel wall thickening, although evaluation of the bowel loops is markedly limited. No evidence of obstruction. Vascular/Lymphatic: Aortic atherosclerosis. Mildly enlarged periaortic lymph node measuring 12 mm in short axis on series 3, image 75. Bilateral enlarged inguinal lymph nodes. Reference left inguinal lymph node measuring 1.6 mm in short axis on series 3, image 127. Reproductive: Prostate is unremarkable. Other: Diffuse body wall edema. Small fat containing periumbilical hernia. Diffuse  mesenteric stranding and trace abdominopelvic ascites. Musculoskeletal: No acute or significant osseous findings. IMPRESSION: 1. Cardiomegaly and moderate pericardial effusion. 2. Small left-greater-than-right pleural effusions and atelectasis. 3. Evaluation  of the abdomen and pelvis is markedly limited due to streak artifact and diffuse anasarca. 4. Diffuse mesenteric stranding and trace abdominopelvic ascites, possibly related to volume status. 5. Bladder wall thickening, findings can be seen in the setting of cystitis. Correlate with urinalysis. 6. Enlarged periaortic and bilateral inguinal lymph nodes, possibly reactive. Consider follow-up CT of the abdomen and pelvis in 3 months to ensure resolution. 7. Severe coronary artery calcifications and aortic Atherosclerosis (ICD10-I70.0). Electronically Signed   By: Allegra Lai M.D.   On: 10/25/2022 18:41   DG Chest Port 1 View  Result Date: 10/25/2022 CLINICAL DATA:  Shortness of breath EXAM: PORTABLE CHEST 1 VIEW COMPARISON:  X-ray 04/14/2009 FINDINGS: Enlarged cardiopericardial silhouette. Calcified aorta. Prominent central vasculature. Tiny left effusion. Mild lung base opacities. Overlapping cardiac leads. IMPRESSION: Enlarged cardiopericardial silhouette. Tiny left effusion. Mild lung base opacities, left-greater-than-right. Subtle infiltrates possible. Recommend follow-up. Electronically Signed   By: Karen Kays M.D.   On: 10/25/2022 15:21      Signature  -   Susa Raring M.D on 10/28/2022 at 9:53 AM   -  To page go to [email or web address]

## 2022-10-28 NOTE — Progress Notes (Signed)
   Plan for TEE to rule out endocarditis on Tuesday.  Currently afebrile.  On IV penicillin.  Anasarca/pericardial effusion - Continue with protein addition and dietary -Lasix spironolactone -No indication for pericardiocentesis  PAF - Eliquis low-dose beta-blocker  Donato Schultz, MD

## 2022-10-29 DIAGNOSIS — A419 Sepsis, unspecified organism: Secondary | ICD-10-CM | POA: Diagnosis not present

## 2022-10-29 DIAGNOSIS — R6520 Severe sepsis without septic shock: Secondary | ICD-10-CM | POA: Diagnosis not present

## 2022-10-29 LAB — COMPREHENSIVE METABOLIC PANEL
ALT: 16 U/L (ref 0–44)
AST: 32 U/L (ref 15–41)
Albumin: 1.7 g/dL — ABNORMAL LOW (ref 3.5–5.0)
Alkaline Phosphatase: 99 U/L (ref 38–126)
Anion gap: 10 (ref 5–15)
BUN: 25 mg/dL — ABNORMAL HIGH (ref 8–23)
CO2: 28 mmol/L (ref 22–32)
Calcium: 8.2 mg/dL — ABNORMAL LOW (ref 8.9–10.3)
Chloride: 99 mmol/L (ref 98–111)
Creatinine, Ser: 1.07 mg/dL (ref 0.61–1.24)
GFR, Estimated: >60 mL/min (ref >60)
Glucose, Bld: 144 mg/dL — ABNORMAL HIGH (ref 70–99)
Potassium: 4.1 mmol/L (ref 3.5–5.1)
Sodium: 137 mmol/L (ref 135–145)
Total Bilirubin: 0.5 mg/dL (ref 0.3–1.2)
Total Protein: 4.5 g/dL — ABNORMAL LOW (ref 6.5–8.1)

## 2022-10-29 LAB — CBC
HCT: 34.4 % — ABNORMAL LOW (ref 39.0–52.0)
Hemoglobin: 10.9 g/dL — ABNORMAL LOW (ref 13.0–17.0)
MCH: 30 pg (ref 26.0–34.0)
MCHC: 31.7 g/dL (ref 30.0–36.0)
MCV: 94.8 fL (ref 80.0–100.0)
Platelets: 157 10*3/uL (ref 150–400)
RBC: 3.63 MIL/uL — ABNORMAL LOW (ref 4.22–5.81)
RDW: 13.5 % (ref 11.5–15.5)
WBC: 8.3 10*3/uL (ref 4.0–10.5)
nRBC: 0 % (ref 0.0–0.2)

## 2022-10-29 LAB — PROCALCITONIN: Procalcitonin: 3.82 ng/mL

## 2022-10-29 LAB — GLUCOSE, CAPILLARY
Glucose-Capillary: 140 mg/dL — ABNORMAL HIGH (ref 70–99)
Glucose-Capillary: 236 mg/dL — ABNORMAL HIGH (ref 70–99)
Glucose-Capillary: 130 mg/dL — ABNORMAL HIGH (ref 70–99)
Glucose-Capillary: 185 mg/dL — ABNORMAL HIGH (ref 70–99)

## 2022-10-29 LAB — MAGNESIUM: Magnesium: 1.6 mg/dL — ABNORMAL LOW (ref 1.7–2.4)

## 2022-10-29 LAB — C-REACTIVE PROTEIN: CRP: 11.1 mg/dL — ABNORMAL HIGH (ref <1.0)

## 2022-10-29 LAB — LEGIONELLA PNEUMOPHILA SEROGP 1 UR AG: L. pneumophila Serogp 1 Ur Ag: NEGATIVE

## 2022-10-29 MED ORDER — MAGNESIUM SULFATE 4 GM/100ML IV SOLN
4.0000 g | Freq: Once | INTRAVENOUS | Status: AC
  Administered 2022-10-29: 4 g via INTRAVENOUS
  Filled 2022-10-29: qty 100

## 2022-10-29 NOTE — Plan of Care (Signed)

## 2022-10-29 NOTE — TOC CM/SW Note (Signed)
Transition of Care [facility]) - Inpatient Brief Assessment   Patient Details  Name: Mason Hill MRN: [id number] Date of Birth: [birth year]  Transition of Care [name]) CM/SW Contact:    Tom-Collingsworth, Hershal Coria, RN Phone Number: 10/29/2022, 2:39 PM   Clinical Narrative:  Patient presented to the ED with Chest Pain. On Eliquis for has hx of A-Fib. Cardiology following.   From home with grandson. Independent with care and grandson assists as needed. Has a cane, walker and bsc at home.  PCP is Shapely-Quinn, Desiree Lucy, MD and uses [facility] on [address]   No PT/OT f/u noted. CM will continue to follow as patient progresses with care towards discharge.           Transition of Care Asessment: Insurance and Status: Insurance coverage has been reviewed Patient has primary care physician: Yes Home environment has been reviewed: Yes Prior level of function:: Independent Prior/Current Home Services: No current home services Social Determinants of Health Reivew: SDOH reviewed no interventions necessary Readmission risk has been reviewed: Yes Transition of care needs: no transition of care needs at this time

## 2022-10-29 NOTE — Progress Notes (Signed)
ID PROGRESS NOTE  82yo M admitted with chest pain and found to be hypotensivie with fevers and leukocytosis of 21K. Infectious work up showed group b strep bacteremia (3/4 bottles)  Now improving. Bacteremia resolved (/8/31 blood cx NGTD). Also leukocytosis resolved  TTE showed moderate pericardial effusion but no mention of vegetation  Has upcoming TEE on 8/6  A/P: group b strep bacteremia of unclear source\ Continue with PenG, will await TEE to see if needs long course of IV abtx Pericardial effusion to monitor and see on repeat imaging if it has decreased in size

## 2022-10-29 NOTE — Progress Notes (Addendum)
Progress Note  Patient Name: Mason Hill Date of Encounter: 10/29/2022  Primary Cardiologist:   None   Subjective   Denies pain or SOB.     Inpatient Medications    Scheduled Meds:  (feeding supplement) PROSource Plus  30 mL Oral BID BM   apixaban  5 mg Oral BID   aspirin EC  81 mg Oral Daily   atorvastatin  40 mg Oral Daily   feeding supplement  237 mL Oral BID BM   furosemide  60 mg Intravenous BID   insulin aspart  0-5 Units Subcutaneous QHS   insulin aspart  0-6 Units Subcutaneous TID WC   lactose free nutrition  237 mL Oral BID BM   metoprolol tartrate  25 mg Oral BID   multivitamin with minerals  1 tablet Oral Daily   sodium chloride flush  3 mL Intravenous Q12H   spironolactone  25 mg Oral Daily   Continuous Infusions:  sodium chloride     penicillin G potassium 12 Million Units in dextrose 5 % 500 mL CONTINUOUS infusion 12 Million Units (10/28/22 2055)   PRN Meds: sodium chloride, acetaminophen **OR** acetaminophen, melatonin, ondansetron **OR** ondansetron (ZOFRAN) IV, sodium chloride flush   Vital Signs    Vitals:   10/29/22 0200 10/29/22 0300 10/29/22 0500 10/29/22 0806  BP:  107/83    Pulse: 69 69    Resp:  15    Temp:  98.1 F (36.7 C)    TempSrc:  Oral  Oral  SpO2: (!) 89% 93%    Weight:   101.9 kg   Height:        Intake/Output Summary (Last 24 hours) at 10/29/2022 0823 Last data filed at 10/29/2022 4098 Gross per 24 hour  Intake 3456.42 ml  Output 6000 ml  Net -2543.58 ml   Filed Weights   10/27/22 0500 10/28/22 0300 10/29/22 0500  Weight: 106.7 kg 103.8 kg 101.9 kg    Telemetry    NA - Personally Reviewed  ECG    NA - Personally Reviewed  Physical Exam   GEN: No acute distress.   Neck: No  JVD Cardiac: RRR, distant systolic murmur, no diastolic murmurs, rubs, or gallops.  Respiratory: Clear  to auscultation bilaterally. GI: Soft, nontender, non-distended  MS:     Severe diffuse  edema; No deformity. Neuro:  Nonfocal   Psych: Normal affect   Labs    Chemistry Recent Labs  Lab 10/27/22 0650 10/28/22 0334 10/29/22 0259  NA 139 139 137  K 3.7 3.9 4.1  CL 100 101 99  CO2 28 30 28   GLUCOSE 127* 115* 144*  BUN 24* 26* 25*  CREATININE 1.07 1.14 1.07  CALCIUM 8.2* 8.0* 8.2*  PROT 4.5* 4.4* 4.5*  ALBUMIN 1.8* 1.6* 1.7*  AST 21 26 32  ALT 14 13 16   ALKPHOS 68 83 99  BILITOT 0.7 0.6 0.5  GFRNONAA >60 >60 >60  ANIONGAP 11 8 10      Hematology Recent Labs  Lab 10/27/22 0650 10/28/22 0334 10/29/22 0259  WBC 16.8* 12.8* 8.3  RBC 3.45* 3.32* 3.63*  HGB 10.6* 10.4* 10.9*  HCT 32.5* 32.0* 34.4*  MCV 94.2 96.4 94.8  MCH 30.7 31.3 30.0  MCHC 32.6 32.5 31.7  RDW 14.2 14.0 13.5  PLT 131* 133* 157    Cardiac EnzymesNo results for input(s): "TROPONINI" in the last 168 hours. No results for input(s): "TROPIPOC" in the last 168 hours.   BNP Recent Labs  Lab 10/25/22 1500  BNP 684.8*     DDimer No results for input(s): "DDIMER" in the last 168 hours.   Radiology    No results found.  Cardiac Studies   Echo   1. Left ventricular ejection fraction, by estimation, is 60 to 65%. Left  ventricular ejection fraction by PLAX is 62 %. The left ventricle has  normal function. The left ventricle has no regional wall motion  abnormalities. Left ventricular diastolic  parameters are indeterminate. There is the interventricular septum is  flattened in systole, consistent with right ventricular pressure overload.   2. Right ventricular systolic function is severely reduced. The right  ventricular size is moderately enlarged. There is mildly elevated  pulmonary artery systolic pressure. The estimated right ventricular  systolic pressure is 42.7 mmHg.   3. Right atrial size was severely dilated.   4. Pericardial fluid measures 1.36 cm over the RV apex and up to 2.29 cm  around the right atrium.. Moderate pericardial effusion. The pericardial  effusion is anterior to the right ventricle.   5. The  mitral valve is degenerative. Trivial mitral valve regurgitation.   6. The aortic valve is tricuspid. Aortic valve regurgitation is not  visualized.   7. Aortic dilatation noted. There is borderline dilatation of the aortic  root, measuring 39 mm.   8. The inferior vena cava is dilated in size with <50% respiratory  variability, suggesting right atrial pressure of 15 mmHg.    Patient Profile     82 y.o. male with history of hypertension, CKD stage III, hyperlipidemia, diabetes type 2, chronic diastolic CHF, paroxysmal A-fib, coronary artery disease, hypertension, hyperlipidemia, peripheral artery disease require intervention who initially presented with midepigastric pain, chest pain, abdominal pain, dry cough, progressive worsening of bilateral lower extremity swelling, dyspnea, orthopnea.   Assessment & Plan    Pneumonia:  Per primary team:     Anasarca:   Likely related to nutrition.  I/O net negative 2.5 liters at least.  Weight seems to be down about 6 lbs.   Continue current IV Lasix and spironolactone.  Manage with diuresis and nutrition supplementation.  Follow BPs and respiration  Chronic anticoagulation:  Continue Eliquis.   Bacteremia: Group B strep.  TEE tomorrow to rule out endocarditis.    Continue penicillin.  He is tentatively scheduled for Friday secondary to anesthesia.  We will try to move this up.     Pericardial effusion:    Managing conservatively.  No tamponade physiology.    CAD:    Elevated troponin thought to be demand ischemia.    Acute on chronic diastolic HF:  Continue IV diuresis.     HTN:   BP is low/normal on meds as listed.    RV dysfunction:  Severely reduced RV function with increase pulmonary pressure certainly contributing to his edema.  Continue to manage as above.   For questions or updates, please contact [facility] Please consult [email or web address] for contact info under Cardiology/STEMI.   Signed, Rollene Rotunda, MD  10/29/2022, 8:23 AM

## 2022-10-29 NOTE — Progress Notes (Signed)
PROGRESS NOTE                                                                                                                                                                                                             Patient Demographics:    Mason Hill, is a 82 y.o. male, DOB - Dec 24, 1940, ZOX:[id number]  Outpatient Primary MD for the patient is Shapely-Quinn, Desiree Lucy, MD    LOS - 4  Admit date - 10/25/2022    Chief Complaint  Patient presents with   Chest Pain       Brief Narrative (HPI from H&P)   82 year old male with history of hypertension, CKD stage III, hyperlipidemia, diabetes type 2, chronic diastolic CHF, paroxysmal A-fib, coronary artery disease, hypertension, hyperlipidemia, peripheral artery disease require intervention who initially presented with midepigastric pain, chest pain, abdominal pain, dry cough, progressive worsening of bilateral lower extremity swelling, dyspnea, orthopnea.  On presentation, blood pressure was soft, had mild fever but saturating fine on room air.  Respiratory viral panel came out of negative.  Lab work showed lactic acid of 2.5, potassium of 3.3, creatinine of 1.6, WBC count of 16.4, elevated BNP of 664, elevated troponin.  EKG showed A-fib.  Chest x-ray showed enlarged cardiac shadow, mid lung base opacity left greater than right.  CT abdomen/pelvis showed cardiomegaly, moderate pericardial effusion.  Patient was admitted for the management of possible sepsis secondary to pneumonia, +++ edema,  Cardiology ,ID also consulted.    Subjective:   Patient in bed, appears comfortable, denies any headache, no fever, no chest pain or pressure, no shortness of breath , no abdominal pain. No new focal weakness.   Assessment  & Plan :    Community-acquired pneumonia, strep agalactiae bacteremia 2/2 :Chest x-ray showed possible bilateral pneumonia left greater than right.  Blood culture  positive for strep agalactiae, appreciate ID input, TTE shows no vegetation, cardiology requested to arrange for TEE on 10/30/22 if schedule permits, discussed with cardiologist on 10/29/2022, continue present antibiotics as directed by ID and monitor.  Repeat blood cultures will be monitored.   Enlarged periaortic/bilateral inguinal lymph nodes: Likely reactive.  Recommend follow-up CT abdomen/pelvis in 3 months, cardiology on board.   Anasarca: Has low albumin.  Likely from moderate protein calorie malnutrition, UA has no proteinuria, LFTs are stable, on protein supplementation and diuresis  with Lasix and Aldactone, add TED stockings as well.   Pericardial effusion: Noted on echocardiogram, moderate, cardiology on board.   Coronary artery disease/elevated troponin: Elevated with flat trend.  Complaint of chest pain on admission which has resolved.  Currently chest pain-free.  EKG showed A-fib, PVCs, minimal ST depression in the inferior leads.  Elevated troponin could be from demand ischemia.  Echocardiogram ordered.  Takes Lipitor at home.  Cardiology following   Paroxysmal A-fib: On Eliquis for anticoagulation, and low-dose beta-blocker, cardiology following.  Monitor on telemetry.  Currently in normal sinus rhythm   Chronic diastolic CHF with exacerbation: Takes amlodipine, losartan, Jardiance at home.  Presented with anasarca, elevated BNP.  Chest x-ray showing cardiomegaly.  Continue diuretics, cardiology on board.   Hypertension: Currently antihypertensives on hold.  On losartan, spironolactone, Jardiance, Lasix at home.   Hypokalemia/hypomagnesemia: Currently being monitored and supplemented as needed.   Debility/deconditioning: Patient lives with his grandson.  Walks fine at home.  Continue PT.  Diabetes type 2: A1c of 6.  Jardiance at home.  Currently on sliding scale   Lab Results  Component Value Date   HGBA1C 6.0 (H) 10/26/2022   CBG (last 3)  Recent Labs    10/28/22 1620  10/28/22 2111 10/29/22 0803  GLUCAP 181* 178* 140*          Condition -  Guarded  Family Communication  :  None  Code Status :  Full  Consults  :  Cards, ID  PUD Prophylaxis :     Procedures  :     TEE  CT chest abdomen and pelvis- 1. Cardiomegaly and moderate pericardial effusion. 2. Small left-greater-than-right pleural effusions and atelectasis. 3. Evaluation of the abdomen and pelvis is markedly limited due to streak artifact and diffuse anasarca. 4. Diffuse mesenteric stranding and trace abdominopelvic ascites, possibly related to volume status. 5. Bladder wall thickening, findings can be seen in the setting of cystitis. Correlate with urinalysis. 6. Enlarged periaortic and bilateral inguinal lymph nodes, possibly reactive. Consider follow-up CT of the abdomen and pelvis in 3 months to ensure resolution. 7. Severe coronary artery calcifications and aortic Atherosclerosis (ICD10-I70.0).    TTE -  1. Left ventricular ejection fraction, by estimation, is 60 to 65%. Left ventricular ejection fraction by PLAX is 62 %. The left ventricle has normal function. The left ventricle has no regional wall motion abnormalities. Left ventricular diastolic parameters are indeterminate. There is the interventricular septum is flattened in systole, consistent with right ventricular pressure overload.  2. Right ventricular systolic function is severely reduced. The right ventricular size is moderately enlarged. There is mildly elevated pulmonary artery systolic pressure. The estimated right ventricular systolic pressure is 42.7 mmHg.  3. Right atrial size was severely dilated.  4. Pericardial fluid measures 1.36 cm over the RV apex and up to 2.29 cm around the right atrium.. Moderate pericardial effusion. The pericardial effusion is anterior to the right ventricle.  5. The mitral valve is degenerative. Trivial mitral valve regurgitation.  6. The aortic valve is tricuspid. Aortic valve regurgitation is not  visualized.  7. Aortic dilatation noted. There is borderline dilatation of the aortic root, measuring 39 mm.  8. The inferior vena cava is dilated in size with <50% respiratory variability, suggesting right atrial pressure of 15 mmHg.      Disposition Plan  :    Status is: Inpatient   DVT Prophylaxis  :    Place TED hose Start: 10/27/22 0939 SCDs Start: 10/25/22 1934  apixaban (ELIQUIS) tablet 5 mg     Lab Results  Component Value Date   PLT 157 10/29/2022    Diet :  Diet Order             Diet NPO time specified Except for: Sips with Meds  Diet effective now           Diet regular Room service appropriate? Yes with Assist; Fluid consistency: Thin; Fluid restriction: 1500 mL Fluid  Diet effective now                    Inpatient Medications  Scheduled Meds:  (feeding supplement) PROSource Plus  30 mL Oral BID BM   apixaban  5 mg Oral BID   aspirin EC  81 mg Oral Daily   atorvastatin  40 mg Oral Daily   feeding supplement  237 mL Oral BID BM   furosemide  60 mg Intravenous BID   insulin aspart  0-5 Units Subcutaneous QHS   insulin aspart  0-6 Units Subcutaneous TID WC   lactose free nutrition  237 mL Oral BID BM   metoprolol tartrate  25 mg Oral BID   multivitamin with minerals  1 tablet Oral Daily   sodium chloride flush  3 mL Intravenous Q12H   spironolactone  25 mg Oral Daily   Continuous Infusions:  sodium chloride     penicillin G potassium 12 Million Units in dextrose 5 % 500 mL CONTINUOUS infusion 12 Million Units (10/28/22 2055)   PRN Meds:.sodium chloride, acetaminophen **OR** acetaminophen, melatonin, ondansetron **OR** ondansetron (ZOFRAN) IV, sodium chloride flush    Objective:   Vitals:   10/29/22 0200 10/29/22 0300 10/29/22 0500 10/29/22 0806  BP:  107/83    Pulse: 69 69    Resp:  15    Temp:  98.1 F (36.7 C)    TempSrc:  Oral  Oral  SpO2: (!) 89% 93%    Weight:   101.9 kg   Height:        Wt Readings from Last 3 Encounters:   10/29/22 101.9 kg  07/17/19 91.2 kg     Intake/Output Summary (Last 24 hours) at 10/29/2022 0955 Last data filed at 10/29/2022 4132 Gross per 24 hour  Intake 3456.42 ml  Output 6000 ml  Net -2543.58 ml     Physical Exam  Awake Alert, No new F.N deficits, Normal affect [place].AT,PERRAL Supple Neck, No JVD,   Symmetrical Chest wall movement, Good air movement bilaterally, CTAB RRR,No Gallops,Rubs or new Murmurs,  +ve B.Sounds, Abd Soft, No tenderness,   2+ edema      Data Review:    Recent Labs  Lab 10/25/22 1451 10/26/22 0028 10/27/22 0650 10/28/22 0334 10/29/22 0259  WBC 16.4* 21.7* 16.8* 12.8* 8.3  HGB 11.2* 11.8* 10.6* 10.4* 10.9*  HCT 35.0* 36.9* 32.5* 32.0* 34.4*  PLT 174 149* 131* 133* 157  MCV 94.9 95.3 94.2 96.4 94.8  MCH 30.4 30.5 30.7 31.3 30.0  MCHC 32.0 32.0 32.6 32.5 31.7  RDW 13.7 13.9 14.2 14.0 13.5  LYMPHSABS 0.3*  --   --   --   --   MONOABS 1.6*  --   --   --   --   EOSABS 0.0  --   --   --   --   BASOSABS 0.1  --   --   --   --     Recent Labs  Lab 10/25/22 1451 10/25/22 1500 10/25/22 1515 10/25/22 1723 10/25/22  1941 10/26/22 0028 10/26/22 0544 10/27/22 0650 10/28/22 0334 10/29/22 0259  NA 141  --   --   --   --  140  --  139 139 137  K 3.3*  --   --   --   --  4.1  --  3.7 3.9 4.1  CL 106  --   --   --   --  101  --  100 101 99  CO2 27  --   --   --   --  26  --  28 30 28   ANIONGAP 8  --   --   --   --  13  --  11 8 10   GLUCOSE 105*  --   --   --   --  119*  --  127* 115* 144*  BUN 18  --   --   --   --  21  --  24* 26* 25*  CREATININE 1.16  --   --   --   --  1.15  --  1.07 1.14 1.07  AST 25  --   --   --   --  25  --  21 26 32  ALT 13  --   --   --   --  13  --  14 13 16   ALKPHOS 46  --   --   --   --  46  --  68 83 99  BILITOT 1.4*  --   --   --   --  1.0  --  0.7 0.6 0.5  ALBUMIN 1.9*  --   --   --   --  2.3*  --  1.8* 1.6* 1.7*  CRP  --   --   --   --   --   --   --  25.0* 19.3* 11.1*  PROCALCITON  --   --   --   --   --   13.52  --  9.14 6.54 3.82  LATICACIDVEN  --   --  2.5* 3.5* 3.1*  --  1.8  --   --   --   INR 1.2  --   --   --   --   --   --   --   --   --   HGBA1C  --   --   --   --   --  6.0*  --   --   --   --   BNP  --  684.8*  --   --   --   --   --   --   --   --   MG  --   --   --   --   --  1.5*  --  1.8 1.8 1.6*  CALCIUM 7.7*  --   --   --   --  8.1*  --  8.2* 8.0* 8.2*      Recent Labs  Lab 10/25/22 1451 10/25/22 1500 10/25/22 1515 10/25/22 1723 10/25/22 1941 10/26/22 0028 10/26/22 0544 10/27/22 0650 10/28/22 0334 10/29/22 0259  CRP  --   --   --   --   --   --   --  25.0* 19.3* 11.1*  PROCALCITON  --   --   --   --   --  13.52  --  9.14 6.54 3.82  LATICACIDVEN  --   --  2.5* 3.5* 3.1*  --  1.8  --   --   --   INR 1.2  --   --   --   --   --   --   --   --   --   HGBA1C  --   --   --   --   --  6.0*  --   --   --   --   BNP  --  684.8*  --   --   --   --   --   --   --   --   MG  --   --   --   --   --  1.5*  --  1.8 1.8 1.6*  CALCIUM 7.7*  --   --   --   --  8.1*  --  8.2* 8.0* 8.2*    --------------------------------------------------------------------------------------------------------------- Lab Results  Component Value Date   CHOL 143 10/27/2022   HDL 42 10/27/2022   LDLCALC 82 10/27/2022   TRIG 95 10/27/2022   CHOLHDL 3.4 10/27/2022    Lab Results  Component Value Date   HGBA1C 6.0 (H) 10/26/2022     Radiology Reports DG Chest Port 1 View  Result Date: 10/27/2022 CLINICAL DATA:  82 year old male shortness of breath. EXAM: PORTABLE CHEST 1 VIEW COMPARISON:  CT Chest, Abdomen, and Pelvis 10/25/2022 and earlier. FINDINGS: Portable AP semi upright view at 0637 hours. Cardiomegaly. Other mediastinal contours are within normal limits. Visualized tracheal air column is within normal limits. Retrocardiac opacity is stable, with left pleural effusion and atelectasis recently demonstrated on CT. No pneumothorax or pulmonary edema. Stable right lung ventilation. No acute  osseous abnormality identified. IMPRESSION: Stable cardiomegaly and left pleural effusion with atelectasis. Electronically Signed   By: Odessa Fleming M.D.   On: 10/27/2022 07:00   ECHOCARDIOGRAM COMPLETE  Result Date: 10/26/2022    ECHOCARDIOGRAM REPORT   Patient Name:   Mason Hill Date of Exam: 10/26/2022 Medical Rec #:  [id number]    Height:       72.0 in Accession #:    [id number]   Weight:       200.0 lb Date of Birth:  10/29/1940    BSA:          2.131 m Patient Age:    82 years     BP:           122/55 mmHg Patient Gender: M            HR:           86 bpm. Exam Location:  Inpatient Procedure: 2D Echo, Cardiac Doppler and Color Doppler Indications:    NSTEMI I21.4  History:        Patient has no prior history of Echocardiogram examinations.  Sonographer:    Harriette Bouillon RDCS Referring Phys: [phone] SUBRINA SUNDIL IMPRESSIONS  1. Left ventricular ejection fraction, by estimation, is 60 to 65%. Left ventricular ejection fraction by PLAX is 62 %. The left ventricle has normal function. The left ventricle has no regional wall motion abnormalities. Left ventricular diastolic parameters are indeterminate. There is the interventricular septum is flattened in systole, consistent with right ventricular pressure overload.  2. Right ventricular systolic function is severely reduced. The right ventricular size is moderately enlarged. There is mildly elevated pulmonary artery systolic pressure. The estimated right ventricular systolic pressure is 42.7 mmHg.  3. Right atrial size was severely dilated.  4. Pericardial fluid measures 1.36 cm over the RV apex and  up to 2.29 cm around the right atrium.. Moderate pericardial effusion. The pericardial effusion is anterior to the right ventricle.  5. The mitral valve is degenerative. Trivial mitral valve regurgitation.  6. The aortic valve is tricuspid. Aortic valve regurgitation is not visualized.  7. Aortic dilatation noted. There is borderline dilatation of the aortic root,  measuring 39 mm.  8. The inferior vena cava is dilated in size with <50% respiratory variability, suggesting right atrial pressure of 15 mmHg. Comparison(s): No prior Echocardiogram. Conclusion(s)/Recommendation(s): Moderate sized pericardial effusion. Cannot exclude tamponade physiology given severe RV dysfunction. Clinical correlation is advised. FINDINGS  Left Ventricle: Left ventricular ejection fraction, by estimation, is 60 to 65%. Left ventricular ejection fraction by PLAX is 62 %. The left ventricle has normal function. The left ventricle has no regional wall motion abnormalities. The left ventricular internal cavity size was normal in size. There is no left ventricular hypertrophy. The interventricular septum is flattened in systole, consistent with right ventricular pressure overload. Left ventricular diastolic parameters are indeterminate. Right Ventricle: The right ventricular size is moderately enlarged. No increase in right ventricular wall thickness. Right ventricular systolic function is severely reduced. There is mildly elevated pulmonary artery systolic pressure. The tricuspid regurgitant velocity is 2.63 m/s, and with an assumed right atrial pressure of 15 mmHg, the estimated right ventricular systolic pressure is 42.7 mmHg. Left Atrium: Left atrial size was normal in size. Right Atrium: Right atrial size was severely dilated. Pericardium: Pericardial fluid measures 1.36 cm over the RV apex and up to 2.29 cm around the right atrium. A moderately sized pericardial effusion is present. The pericardial effusion is anterior to the right ventricle. Mitral Valve: The mitral valve is degenerative in appearance. There is mild calcification of the anterior and posterior mitral valve leaflet(s). Mild to moderate mitral annular calcification. Trivial mitral valve regurgitation. Tricuspid Valve: The tricuspid valve is grossly normal. Tricuspid valve regurgitation is mild. Aortic Valve: The aortic valve is  tricuspid. Aortic valve regurgitation is not visualized. Pulmonic Valve: The pulmonic valve was grossly normal. Pulmonic valve regurgitation is trivial. Aorta: Aortic dilatation noted. There is borderline dilatation of the aortic root, measuring 39 mm. Venous: The inferior vena cava is dilated in size with less than 50% respiratory variability, suggesting right atrial pressure of 15 mmHg. IAS/Shunts: No atrial level shunt detected by color flow Doppler.  LEFT VENTRICLE PLAX 2D LV EF:         Left            Diastology                ventricular     LV e' medial:    5.77 cm/s                ejection        LV E/e' medial:  18.7                fraction by     LV e' lateral:   8.05 cm/s                PLAX is 62      LV E/e' lateral: 13.4                %. LVIDd:         5.30 cm LVIDs:         3.50 cm LV PW:         1.00 cm LV IVS:  1.00 cm LVOT diam:     2.20 cm LV SV:         76 LV SV Index:   36 LVOT Area:     3.80 cm  RIGHT VENTRICLE         IVC TAPSE (M-mode): 0.6 cm  IVC diam: 3.80 cm LEFT ATRIUM             Index        RIGHT ATRIUM           Index LA diam:        4.40 cm 2.07 cm/m   RA Area:     27.50 cm LA Vol (A2C):   50.1 ml 23.51 ml/m  RA Volume:   101.00 ml 47.41 ml/m LA Vol (A4C):   26.2 ml 12.30 ml/m LA Biplane Vol: 38.8 ml 18.21 ml/m  AORTIC VALVE LVOT Vmax:   102.00 cm/s LVOT Vmean:  72.200 cm/s LVOT VTI:    0.201 m  AORTA Ao Root diam: 3.90 cm Ao Asc diam:  3.50 cm MITRAL VALVE                TRICUSPID VALVE MV Area (PHT): 4.77 cm     TR Peak grad:   27.7 mmHg MV Decel Time: 159 msec     TR Vmax:        263.00 cm/s MV E velocity: 108.00 cm/s                             SHUNTS                             Systemic VTI:  0.20 m                             Systemic Diam: 2.20 cm Zoila Shutter MD Electronically signed by Zoila Shutter MD Signature Date/Time: 10/26/2022/2:41:21 PM    Final    CT CHEST ABDOMEN PELVIS W CONTRAST  Result Date: 10/25/2022 CLINICAL DATA:  Chest pain EXAM: CT  CHEST, ABDOMEN, AND PELVIS WITH CONTRAST TECHNIQUE: Multidetector CT imaging of the chest, abdomen and pelvis was performed following the standard protocol during bolus administration of intravenous contrast. RADIATION DOSE REDUCTION: This exam was performed according to the departmental dose-optimization program which includes automated exposure control, adjustment of the mA and/or kV according to patient size and/or use of iterative reconstruction technique. CONTRAST:  75mL OMNIPAQUE IOHEXOL 350 MG/ML SOLN COMPARISON:  None Available. FINDINGS: Limited exam due to anasarca and streak artifact related to patient arm position. CT CHEST FINDINGS Cardiovascular: Cardiomegaly. Moderate pericardial effusion. Caliber thoracic aorta with moderate atherosclerotic disease. Severe coronary artery calcifications. Mediastinum/Nodes: Esophagus and thyroid are unremarkable. No enlarged lymph nodes seen in the chest. Lungs/Pleura: Central airways are patent. Small left-greater-than-right pleural effusions and atelectasis. No pneumothorax. Musculoskeletal: No chest wall mass or suspicious bone lesions identified. CT ABDOMEN PELVIS FINDINGS Hepatobiliary: No focal liver abnormality is seen, although evaluation of the liver is greatly limited due to artifact. Gallbladder is grossly normal. No evidence of biliary ductal dilation. Pancreas: Unremarkable. Spleen: Normal in size without focal abnormality. Adrenals/Urinary Tract: Bilateral adrenal glands are unremarkable. No hydronephrosis. Nonobstructing left renal stone. Bilateral low-attenuation renal lesions which are likely simple cysts, artifact limits evaluation, no specific follow-up imaging is necessary. Bladder wall thickening. Stomach/Bowel: Stomach is within normal limits. No  definite bowel wall thickening, although evaluation of the bowel loops is markedly limited. No evidence of obstruction. Vascular/Lymphatic: Aortic atherosclerosis. Mildly enlarged periaortic lymph node  measuring 12 mm in short axis on series 3, image 75. Bilateral enlarged inguinal lymph nodes. Reference left inguinal lymph node measuring 1.6 mm in short axis on series 3, image 127. Reproductive: Prostate is unremarkable. Other: Diffuse body wall edema. Small fat containing periumbilical hernia. Diffuse mesenteric stranding and trace abdominopelvic ascites. Musculoskeletal: No acute or significant osseous findings. IMPRESSION: 1. Cardiomegaly and moderate pericardial effusion. 2. Small left-greater-than-right pleural effusions and atelectasis. 3. Evaluation of the abdomen and pelvis is markedly limited due to streak artifact and diffuse anasarca. 4. Diffuse mesenteric stranding and trace abdominopelvic ascites, possibly related to volume status. 5. Bladder wall thickening, findings can be seen in the setting of cystitis. Correlate with urinalysis. 6. Enlarged periaortic and bilateral inguinal lymph nodes, possibly reactive. Consider follow-up CT of the abdomen and pelvis in 3 months to ensure resolution. 7. Severe coronary artery calcifications and aortic Atherosclerosis (ICD10-I70.0). Electronically Signed   By: Allegra Lai M.D.   On: 10/25/2022 18:41   DG Chest Port 1 View  Result Date: 10/25/2022 CLINICAL DATA:  Shortness of breath EXAM: PORTABLE CHEST 1 VIEW COMPARISON:  X-ray 04/14/2009 FINDINGS: Enlarged cardiopericardial silhouette. Calcified aorta. Prominent central vasculature. Tiny left effusion. Mild lung base opacities. Overlapping cardiac leads. IMPRESSION: Enlarged cardiopericardial silhouette. Tiny left effusion. Mild lung base opacities, left-greater-than-right. Subtle infiltrates possible. Recommend follow-up. Electronically Signed   By: Karen Kays M.D.   On: 10/25/2022 15:21      Signature  -   Susa Raring M.D on 10/29/2022 at 9:55 AM   -  To page go to [email or web address]

## 2022-10-30 ENCOUNTER — Inpatient Hospital Stay (HOSPITAL_COMMUNITY)
Admit: 2022-10-30 | Discharge: 2022-10-30 | Disposition: A | Discharge status: Home / Self Care | Payer: Medicare Other | Attending: Cardiology | Admitting: Cardiology

## 2022-10-30 ENCOUNTER — Inpatient Hospital Stay (HOSPITAL_COMMUNITY): Payer: Medicare Other | Admitting: Certified Registered Nurse Anesthetist

## 2022-10-30 ENCOUNTER — Encounter (HOSPITAL_COMMUNITY)
Admission: EM | Disposition: A | Discharge status: Home Health | Payer: Self-pay | Source: Home / Self Care | Attending: Internal Medicine

## 2022-10-30 ENCOUNTER — Other Ambulatory Visit: Payer: Self-pay

## 2022-10-30 ENCOUNTER — Encounter (HOSPITAL_COMMUNITY): Payer: Self-pay | Admitting: Internal Medicine

## 2022-10-30 DIAGNOSIS — I361 Nonrheumatic tricuspid (valve) insufficiency: Secondary | ICD-10-CM

## 2022-10-30 DIAGNOSIS — I3139 Other pericardial effusion (noninflammatory): Secondary | ICD-10-CM

## 2022-10-30 DIAGNOSIS — A401 Sepsis due to streptococcus, group B: Secondary | ICD-10-CM | POA: Diagnosis not present

## 2022-10-30 DIAGNOSIS — I130 Hypertensive heart and chronic kidney disease with heart failure and stage 1 through stage 4 chronic kidney disease, or unspecified chronic kidney disease: Secondary | ICD-10-CM | POA: Diagnosis not present

## 2022-10-30 DIAGNOSIS — I058 Other rheumatic mitral valve diseases: Secondary | ICD-10-CM | POA: Diagnosis not present

## 2022-10-30 DIAGNOSIS — I059 Rheumatic mitral valve disease, unspecified: Secondary | ICD-10-CM

## 2022-10-30 DIAGNOSIS — B951 Streptococcus, group B, as the cause of diseases classified elsewhere: Secondary | ICD-10-CM | POA: Diagnosis not present

## 2022-10-30 DIAGNOSIS — I351 Nonrheumatic aortic (valve) insufficiency: Secondary | ICD-10-CM

## 2022-10-30 DIAGNOSIS — I5022 Chronic systolic (congestive) heart failure: Secondary | ICD-10-CM | POA: Diagnosis not present

## 2022-10-30 DIAGNOSIS — A419 Sepsis, unspecified organism: Secondary | ICD-10-CM | POA: Diagnosis not present

## 2022-10-30 DIAGNOSIS — I340 Nonrheumatic mitral (valve) insufficiency: Secondary | ICD-10-CM

## 2022-10-30 DIAGNOSIS — R6520 Severe sepsis without septic shock: Secondary | ICD-10-CM | POA: Diagnosis not present

## 2022-10-30 HISTORY — PX: TEE WITHOUT CARDIOVERSION: SHX5443

## 2022-10-30 LAB — CBC
HCT: 33.8 % — ABNORMAL LOW (ref 39.0–52.0)
Hemoglobin: 11 g/dL — ABNORMAL LOW (ref 13.0–17.0)
MCH: 30.2 pg (ref 26.0–34.0)
MCHC: 32.5 g/dL (ref 30.0–36.0)
MCV: 92.9 fL (ref 80.0–100.0)
Platelets: 179 10*3/uL (ref 150–400)
RBC: 3.64 MIL/uL — ABNORMAL LOW (ref 4.22–5.81)
RDW: 13.4 % (ref 11.5–15.5)
WBC: 7.1 10*3/uL (ref 4.0–10.5)
nRBC: 0 % (ref 0.0–0.2)

## 2022-10-30 LAB — GLUCOSE, CAPILLARY
Glucose-Capillary: 116 mg/dL — ABNORMAL HIGH (ref 70–99)
Glucose-Capillary: 176 mg/dL — ABNORMAL HIGH (ref 70–99)
Glucose-Capillary: 197 mg/dL — ABNORMAL HIGH (ref 70–99)
Glucose-Capillary: 160 mg/dL — ABNORMAL HIGH (ref 70–99)

## 2022-10-30 LAB — COMPREHENSIVE METABOLIC PANEL
ALT: 18 U/L (ref 0–44)
AST: 29 U/L (ref 15–41)
Albumin: 1.6 g/dL — ABNORMAL LOW (ref 3.5–5.0)
Alkaline Phosphatase: 100 U/L (ref 38–126)
Anion gap: 9 (ref 5–15)
BUN: 18 mg/dL (ref 8–23)
CO2: 31 mmol/L (ref 22–32)
Calcium: 8.2 mg/dL — ABNORMAL LOW (ref 8.9–10.3)
Chloride: 99 mmol/L (ref 98–111)
Creatinine, Ser: 0.95 mg/dL (ref 0.61–1.24)
GFR, Estimated: >60 mL/min (ref >60)
Glucose, Bld: 132 mg/dL — ABNORMAL HIGH (ref 70–99)
Potassium: 3.9 mmol/L (ref 3.5–5.1)
Sodium: 139 mmol/L (ref 135–145)
Total Bilirubin: 0.5 mg/dL (ref 0.3–1.2)
Total Protein: 4.7 g/dL — ABNORMAL LOW (ref 6.5–8.1)

## 2022-10-30 LAB — PROCALCITONIN: Procalcitonin: 1.79 ng/mL

## 2022-10-30 LAB — MAGNESIUM: Magnesium: 1.8 mg/dL (ref 1.7–2.4)

## 2022-10-30 LAB — C-REACTIVE PROTEIN: CRP: 6.6 mg/dL — ABNORMAL HIGH (ref <1.0)

## 2022-10-30 SURGERY — ECHOCARDIOGRAM, TRANSESOPHAGEAL
Anesthesia: Monitor Anesthesia Care

## 2022-10-30 MED ORDER — MAGNESIUM SULFATE IN D5W 1-5 GM/100ML-% IV SOLN
1.0000 g | Freq: Once | INTRAVENOUS | Status: AC
  Administered 2022-10-30: 1 g via INTRAVENOUS
  Filled 2022-10-30: qty 100

## 2022-10-30 MED ORDER — PROPOFOL 10 MG/ML IV BOLUS
INTRAVENOUS | Status: DC | PRN
  Administered 2022-10-30: 20 mg via INTRAVENOUS
  Administered 2022-10-30: 30 mg via INTRAVENOUS

## 2022-10-30 MED ORDER — PROPOFOL 500 MG/50ML IV EMUL
INTRAVENOUS | Status: DC | PRN
  Administered 2022-10-30: 75 ug/kg/min via INTRAVENOUS

## 2022-10-30 MED ORDER — FUROSEMIDE 10 MG/ML IJ SOLN
80.0000 mg | Freq: Two times a day (BID) | INTRAMUSCULAR | Status: DC
  Administered 2022-10-30 – 2022-11-03 (×8): 80 mg via INTRAVENOUS
  Filled 2022-10-30 (×8): qty 8

## 2022-10-30 MED ORDER — POTASSIUM CHLORIDE CRYS ER 20 MEQ PO TBCR
40.0000 meq | EXTENDED_RELEASE_TABLET | Freq: Once | ORAL | Status: AC
  Administered 2022-10-30: 40 meq via ORAL
  Filled 2022-10-30: qty 2

## 2022-10-30 NOTE — Progress Notes (Signed)
Orthopedic Tech Progress Note Patient Details:  Mason Hill Mar 31, 1940 [id number]  I applied RLE and the new tech applied the LLE   Ortho Devices Type of Ortho Device: Ace wrap, Unna boot Ortho Device/Splint Location: BLE Ortho Device/Splint Interventions: Ordered, Application, Adjustment   Post Interventions Patient Tolerated: Well Instructions Provided: Care of device  [name] 10/30/2022, 3:22 PM

## 2022-10-30 NOTE — CV Procedure (Signed)
    TRANSESOPHAGEAL ECHOCARDIOGRAM   NAME:  Mason Hill   MRN: [id number] DOB:  1940-06-21   ADMIT DATE: 10/25/2022  INDICATIONS: Bacteremia  PROCEDURE:   Informed consent was obtained prior to the procedure. The risks, benefits and alternatives for the procedure were discussed and the patient comprehended these risks.  Risks include, but are not limited to, cough, sore throat, vomiting, nausea, somnolence, esophageal and stomach trauma or perforation, bleeding, low blood pressure, aspiration, pneumonia, infection, trauma to the teeth and death.    Procedural time out performed.   Patient received monitored anesthesia care under the supervision of Dr. Charlynn Grimes. Patient received a total of 170 mg propofol during the procedure.  The transesophageal probe was inserted in the esophagus and stomach without difficulty and multiple views were obtained.    COMPLICATIONS:    There were no immediate complications.  FINDINGS:  LEFT VENTRICLE: EF = 60-65%. No regional wall motion abnormalities.  RIGHT VENTRICLE: Enlarged size with moderately reduced function.   LEFT ATRIUM: No thrombus/mass.  LEFT ATRIAL APPENDAGE: No thrombus/mass.   RIGHT ATRIUM: No thrombus/mass.  AORTIC VALVE:  Trileaflet. Trivial regurgitation. No vegetation.  MITRAL VALVE:    Normal structure, but there are several very small linear echodensities that are freely mobile on atrial surface of mitral valve, consistent with endocarditis. Trivial mitral regurgitation  TRICUSPID VALVE: Normal structure. Mild regurgitation. No vegetation.  PULMONIC VALVE: Grossly normal structure. Mild regurgitation. No apparent vegetation.  INTERATRIAL SEPTUM: No PFO or ASD seen by color Doppler.  PERICARDIUM: Moderate effusion noted.  DESCENDING AORTA: Mild diffuse plaque seen   CONCLUSION: mitral valve endocarditis   [name], MD, PhD, [facility]  [facility]    Heart & Vascular at [facility] at [facility] [address] [place], [place] [address] [phone]   9:17 AM

## 2022-10-30 NOTE — Progress Notes (Signed)
PROGRESS NOTE                                                                                                                                                                                                             Patient Demographics:    Mason Hill, is a 82 y.o. male, DOB - 1940/10/12, ONG:[id number]  Outpatient Primary MD for the patient is Shapely-Quinn, Desiree Lucy, MD    LOS - 5  Admit date - 10/25/2022    Chief Complaint  Patient presents with   Chest Pain       Brief Narrative (HPI from H&P)   82 year old male with history of hypertension, CKD stage III, hyperlipidemia, diabetes type 2, chronic diastolic CHF, paroxysmal A-fib, coronary artery disease, hypertension, hyperlipidemia, peripheral artery disease require intervention who initially presented with midepigastric pain, chest pain, abdominal pain, dry cough, progressive worsening of bilateral lower extremity swelling, dyspnea, orthopnea.  On presentation, blood pressure was soft, had mild fever but saturating fine on room air.  Respiratory viral panel came out of negative.  Lab work showed lactic acid of 2.5, potassium of 3.3, creatinine of 1.6, WBC count of 16.4, elevated BNP of 664, elevated troponin.  EKG showed A-fib.  Chest x-ray showed enlarged cardiac shadow, mid lung base opacity left greater than right.  CT abdomen/pelvis showed cardiomegaly, moderate pericardial effusion.  Patient was admitted for the management of possible sepsis secondary to pneumonia, +++ edema,  Cardiology ,ID also consulted.    Subjective:   Patient in bed, appears comfortable, denies any headache, no fever, no chest pain or pressure, no shortness of breath , no abdominal pain. No focal weakness.   Assessment  & Plan :    Community-acquired pneumonia, strep agalactiae bacteremia 2/2 :Chest x-ray showed possible bilateral pneumonia left greater than right.  Blood culture positive  for strep agalactiae, appreciate ID input, TTE shows no vegetation, cardiology requested to arrange for TEE on 10/30/22 , continue present antibiotics as directed by ID and monitor.  Repeat blood cultures will be monitored negative till morning of 10/30/2022 - day 3.   Enlarged periaortic/bilateral inguinal lymph nodes: Likely reactive.  Recommend follow-up CT abdomen/pelvis in 3 months, cardiology on board.   Anasarca due to combination of hypoalbuminemia, moderate protein calorie malnutrition and dCHF: Has low albumin.  Likely from moderate protein calorie malnutrition, UA  has no proteinuria, LFTs are stable, on protein supplementation and diuresis with Lasix and Aldactone, add TED stockings as well.  Lasix dose increased further on 10/30/2022, still has about 7 to 8 L to diurese.   Pericardial effusion: Noted on echocardiogram, moderate, cardiology on board.   Coronary artery disease/elevated troponin: Elevated with flat trend.  Complaint of chest pain on admission which has resolved.  Currently chest pain-free.  EKG showed A-fib, PVCs, minimal ST depression in the inferior leads.  Elevated troponin could be from demand ischemia.  Echocardiogram ordered.  Takes Lipitor at home.  Cardiology following   Paroxysmal A-fib: On Eliquis for anticoagulation, and low-dose beta-blocker, cardiology following.  Monitor on telemetry.  Currently in normal sinus rhythm   Acute on chronic diastolic CHF exacerbation EF 60%: Takes amlodipine, losartan, Jardiance at home.  Presented with anasarca, elevated BNP.  Chest x-ray showing cardiomegaly.  Continue diuretics, cardiology on board.   Hypertension: Currently antihypertensives on hold.  On losartan, spironolactone, Jardiance, Lasix at home.   Hypokalemia/hypomagnesemia: Currently being monitored and supplemented as needed.   Debility/deconditioning: Patient lives with his grandson.  Walks fine at home.  Continue PT.  Diabetes type 2: A1c of 6.  Jardiance at home.   Currently on sliding scale   Lab Results  Component Value Date   HGBA1C 6.0 (H) 10/26/2022   CBG (last 3)  Recent Labs    10/29/22 1201 10/29/22 1701 10/29/22 2029  GLUCAP 236* 130* 185*          Condition -  Guarded  Family Communication  :  None  Code Status :  Full  Consults  :  Cards, ID  PUD Prophylaxis :     Procedures  :     TEE due on 10/30/22  CT chest abdomen and pelvis- 1. Cardiomegaly and moderate pericardial effusion. 2. Small left-greater-than-right pleural effusions and atelectasis. 3. Evaluation of the abdomen and pelvis is markedly limited due to streak artifact and diffuse anasarca. 4. Diffuse mesenteric stranding and trace abdominopelvic ascites, possibly related to volume status. 5. Bladder wall thickening, findings can be seen in the setting of cystitis. Correlate with urinalysis. 6. Enlarged periaortic and bilateral inguinal lymph nodes, possibly reactive. Consider follow-up CT of the abdomen and pelvis in 3 months to ensure resolution. 7. Severe coronary artery calcifications and aortic Atherosclerosis (ICD10-I70.0).    TTE -  1. Left ventricular ejection fraction, by estimation, is 60 to 65%. Left ventricular ejection fraction by PLAX is 62 %. The left ventricle has normal function. The left ventricle has no regional wall motion abnormalities. Left ventricular diastolic parameters are indeterminate. There is the interventricular septum is flattened in systole, consistent with right ventricular pressure overload.  2. Right ventricular systolic function is severely reduced. The right ventricular size is moderately enlarged. There is mildly elevated pulmonary artery systolic pressure. The estimated right ventricular systolic pressure is 42.7 mmHg.  3. Right atrial size was severely dilated.  4. Pericardial fluid measures 1.36 cm over the RV apex and up to 2.29 cm around the right atrium.. Moderate pericardial effusion. The pericardial effusion is anterior to the  right ventricle.  5. The mitral valve is degenerative. Trivial mitral valve regurgitation.  6. The aortic valve is tricuspid. Aortic valve regurgitation is not visualized.  7. Aortic dilatation noted. There is borderline dilatation of the aortic root, measuring 39 mm.  8. The inferior vena cava is dilated in size with <50% respiratory variability, suggesting right atrial pressure of 15 mmHg.  Disposition Plan  :    Status is: Inpatient   DVT Prophylaxis  :    Place TED hose Start: 10/27/22 0939 SCDs Start: 10/25/22 1934 apixaban (ELIQUIS) tablet 5 mg     Lab Results  Component Value Date   PLT 179 10/30/2022    Diet :  Diet Order             Diet NPO time specified Except for: Sips with Meds  Diet effective now                    Inpatient Medications  Scheduled Meds:  [MAR Hold] (feeding supplement) PROSource Plus  30 mL Oral BID BM   [MAR Hold] apixaban  5 mg Oral BID   [MAR Hold] aspirin EC  81 mg Oral Daily   [MAR Hold] atorvastatin  40 mg Oral Daily   [MAR Hold] feeding supplement  237 mL Oral BID BM   furosemide  80 mg Intravenous BID   [MAR Hold] insulin aspart  0-5 Units Subcutaneous QHS   [MAR Hold] insulin aspart  0-6 Units Subcutaneous TID WC   [MAR Hold] lactose free nutrition  237 mL Oral BID BM   [MAR Hold] metoprolol tartrate  25 mg Oral BID   [MAR Hold] multivitamin with minerals  1 tablet Oral Daily   potassium chloride  40 mEq Oral Once   [MAR Hold] sodium chloride flush  3 mL Intravenous Q12H   [MAR Hold] spironolactone  25 mg Oral Daily   Continuous Infusions:  [MAR Hold] sodium chloride     magnesium sulfate bolus IVPB     [MAR Hold] penicillin G potassium 12 Million Units in dextrose 5 % 500 mL CONTINUOUS infusion 41.7 mL/hr at 10/30/22 0730   PRN Meds:.[MAR Hold] sodium chloride, [MAR Hold] acetaminophen **OR** [MAR Hold] acetaminophen, [MAR Hold] melatonin, [MAR Hold] ondansetron **OR** [MAR Hold] ondansetron (ZOFRAN) IV, [MAR Hold]  sodium chloride flush    Objective:   Vitals:   10/29/22 2017 10/30/22 0000 10/30/22 0400 10/30/22 0745  BP: (!) 104/58 111/65 107/68 129/74  Pulse: 68 68 66 70  Resp: 20 20 20    Temp: 98.5 F (36.9 C) 98.2 F (36.8 C) 98 F (36.7 C) 98.6 F (37 C)  TempSrc: Oral Oral Oral Temporal  SpO2: 94% 90% 92% 92%  Weight:    100.2 kg  Height:    6' (1.829 m)    Wt Readings from Last 3 Encounters:  10/30/22 100.2 kg  07/17/19 91.2 kg     Intake/Output Summary (Last 24 hours) at 10/30/2022 0838 Last data filed at 10/30/2022 0730 Gross per 24 hour  Intake 1408.62 ml  Output 5100 ml  Net -3691.38 ml     Physical Exam  Awake Alert, No new F.N deficits, Normal affect Gardner.AT,PERRAL Supple Neck, No JVD,   Symmetrical Chest wall movement, Good air movement bilaterally, CTAB RRR,No Gallops,Rubs or new Murmurs,  +ve B.Sounds, Abd Soft, No tenderness,   2+ edema      Data Review:    Recent Labs  Lab 10/25/22 1451 10/26/22 0028 10/27/22 0650 10/28/22 0334 10/29/22 0259 10/30/22 0649  WBC 16.4* 21.7* 16.8* 12.8* 8.3 7.1  HGB 11.2* 11.8* 10.6* 10.4* 10.9* 11.0*  HCT 35.0* 36.9* 32.5* 32.0* 34.4* 33.8*  PLT 174 149* 131* 133* 157 179  MCV 94.9 95.3 94.2 96.4 94.8 92.9  MCH 30.4 30.5 30.7 31.3 30.0 30.2  MCHC 32.0 32.0 32.6 32.5 31.7 32.5  RDW 13.7 13.9 14.2  14.0 13.5 13.4  LYMPHSABS 0.3*  --   --   --   --   --   MONOABS 1.6*  --   --   --   --   --   EOSABS 0.0  --   --   --   --   --   BASOSABS 0.1  --   --   --   --   --     Recent Labs  Lab 10/25/22 1451 10/25/22 1500 10/25/22 1515 10/25/22 1723 10/25/22 1941 10/26/22 0028 10/26/22 0544 10/27/22 0650 10/28/22 0334 10/29/22 0259 10/30/22 0649  NA 141  --   --   --   --  140  --  139 139 137 139  K 3.3*  --   --   --   --  4.1  --  3.7 3.9 4.1 3.9  CL 106  --   --   --   --  101  --  100 101 99 99  CO2 27  --   --   --   --  26  --  28 30 28 31   ANIONGAP 8  --   --   --   --  13  --  11 8 10 9   GLUCOSE  105*  --   --   --   --  119*  --  127* 115* 144* 132*  BUN 18  --   --   --   --  21  --  24* 26* 25* 18  CREATININE 1.16  --   --   --   --  1.15  --  1.07 1.14 1.07 0.95  AST 25  --   --   --   --  25  --  21 26 32 29  ALT 13  --   --   --   --  13  --  14 13 16 18   ALKPHOS 46  --   --   --   --  46  --  68 83 99 100  BILITOT 1.4*  --   --   --   --  1.0  --  0.7 0.6 0.5 0.5  ALBUMIN 1.9*  --   --   --   --  2.3*  --  1.8* 1.6* 1.7* 1.6*  CRP  --   --   --   --   --   --   --  25.0* 19.3* 11.1* 6.6*  PROCALCITON  --   --   --   --   --  13.52  --  9.14 6.54 3.82  --   LATICACIDVEN  --   --  2.5* 3.5* 3.1*  --  1.8  --   --   --   --   INR 1.2  --   --   --   --   --   --   --   --   --   --   HGBA1C  --   --   --   --   --  6.0*  --   --   --   --   --   BNP  --  684.8*  --   --   --   --   --   --   --   --   --   MG  --   --   --   --   --  1.5*  --  1.8 1.8 1.6* 1.8  CALCIUM 7.7*  --   --   --   --  8.1*  --  8.2* 8.0* 8.2* 8.2*      Recent Labs  Lab 10/25/22 1451 10/25/22 1500 10/25/22 1515 10/25/22 1723 10/25/22 1941 10/26/22 0028 10/26/22 0544 10/27/22 0650 10/28/22 0334 10/29/22 0259 10/30/22 0649  CRP  --   --   --   --   --   --   --  25.0* 19.3* 11.1* 6.6*  PROCALCITON  --   --   --   --   --  13.52  --  9.14 6.54 3.82  --   LATICACIDVEN  --   --  2.5* 3.5* 3.1*  --  1.8  --   --   --   --   INR 1.2  --   --   --   --   --   --   --   --   --   --   HGBA1C  --   --   --   --   --  6.0*  --   --   --   --   --   BNP  --  684.8*  --   --   --   --   --   --   --   --   --   MG  --   --   --   --   --  1.5*  --  1.8 1.8 1.6* 1.8  CALCIUM 7.7*  --   --   --   --  8.1*  --  8.2* 8.0* 8.2* 8.2*    --------------------------------------------------------------------------------------------------------------- Lab Results  Component Value Date   CHOL 143 10/27/2022   HDL 42 10/27/2022   LDLCALC 82 10/27/2022   TRIG 95 10/27/2022   CHOLHDL 3.4 10/27/2022    Lab  Results  Component Value Date   HGBA1C 6.0 (H) 10/26/2022     Radiology Reports DG Chest Port 1 View  Result Date: 10/27/2022 CLINICAL DATA:  82 year old male shortness of breath. EXAM: PORTABLE CHEST 1 VIEW COMPARISON:  CT Chest, Abdomen, and Pelvis 10/25/2022 and earlier. FINDINGS: Portable AP semi upright view at 0637 hours. Cardiomegaly. Other mediastinal contours are within normal limits. Visualized tracheal air column is within normal limits. Retrocardiac opacity is stable, with left pleural effusion and atelectasis recently demonstrated on CT. No pneumothorax or pulmonary edema. Stable right lung ventilation. No acute osseous abnormality identified. IMPRESSION: Stable cardiomegaly and left pleural effusion with atelectasis. Electronically Signed   By: Odessa Fleming M.D.   On: 10/27/2022 07:00   ECHOCARDIOGRAM COMPLETE  Result Date: 10/26/2022    ECHOCARDIOGRAM REPORT   Patient Name:   Mason Hill Date of Exam: 10/26/2022 Medical Rec #:  [id number]    Height:       72.0 in Accession #:    [id number]   Weight:       200.0 lb Date of Birth:  January 03, 1941    BSA:          2.131 m Patient Age:    82 years     BP:           122/55 mmHg Patient Gender: M            HR:           86 bpm. Exam Location:  Inpatient Procedure: 2D Echo, Cardiac Doppler and Color Doppler Indications:  NSTEMI I21.4  History:        Patient has no prior history of Echocardiogram examinations.  Sonographer:    Harriette Bouillon RDCS Referring Phys: [phone] SUBRINA SUNDIL IMPRESSIONS  1. Left ventricular ejection fraction, by estimation, is 60 to 65%. Left ventricular ejection fraction by PLAX is 62 %. The left ventricle has normal function. The left ventricle has no regional wall motion abnormalities. Left ventricular diastolic parameters are indeterminate. There is the interventricular septum is flattened in systole, consistent with right ventricular pressure overload.  2. Right ventricular systolic function is severely reduced. The right  ventricular size is moderately enlarged. There is mildly elevated pulmonary artery systolic pressure. The estimated right ventricular systolic pressure is 42.7 mmHg.  3. Right atrial size was severely dilated.  4. Pericardial fluid measures 1.36 cm over the RV apex and up to 2.29 cm around the right atrium.. Moderate pericardial effusion. The pericardial effusion is anterior to the right ventricle.  5. The mitral valve is degenerative. Trivial mitral valve regurgitation.  6. The aortic valve is tricuspid. Aortic valve regurgitation is not visualized.  7. Aortic dilatation noted. There is borderline dilatation of the aortic root, measuring 39 mm.  8. The inferior vena cava is dilated in size with <50% respiratory variability, suggesting right atrial pressure of 15 mmHg. Comparison(s): No prior Echocardiogram. Conclusion(s)/Recommendation(s): Moderate sized pericardial effusion. Cannot exclude tamponade physiology given severe RV dysfunction. Clinical correlation is advised. FINDINGS  Left Ventricle: Left ventricular ejection fraction, by estimation, is 60 to 65%. Left ventricular ejection fraction by PLAX is 62 %. The left ventricle has normal function. The left ventricle has no regional wall motion abnormalities. The left ventricular internal cavity size was normal in size. There is no left ventricular hypertrophy. The interventricular septum is flattened in systole, consistent with right ventricular pressure overload. Left ventricular diastolic parameters are indeterminate. Right Ventricle: The right ventricular size is moderately enlarged. No increase in right ventricular wall thickness. Right ventricular systolic function is severely reduced. There is mildly elevated pulmonary artery systolic pressure. The tricuspid regurgitant velocity is 2.63 m/s, and with an assumed right atrial pressure of 15 mmHg, the estimated right ventricular systolic pressure is 42.7 mmHg. Left Atrium: Left atrial size was normal in  size. Right Atrium: Right atrial size was severely dilated. Pericardium: Pericardial fluid measures 1.36 cm over the RV apex and up to 2.29 cm around the right atrium. A moderately sized pericardial effusion is present. The pericardial effusion is anterior to the right ventricle. Mitral Valve: The mitral valve is degenerative in appearance. There is mild calcification of the anterior and posterior mitral valve leaflet(s). Mild to moderate mitral annular calcification. Trivial mitral valve regurgitation. Tricuspid Valve: The tricuspid valve is grossly normal. Tricuspid valve regurgitation is mild. Aortic Valve: The aortic valve is tricuspid. Aortic valve regurgitation is not visualized. Pulmonic Valve: The pulmonic valve was grossly normal. Pulmonic valve regurgitation is trivial. Aorta: Aortic dilatation noted. There is borderline dilatation of the aortic root, measuring 39 mm. Venous: The inferior vena cava is dilated in size with less than 50% respiratory variability, suggesting right atrial pressure of 15 mmHg. IAS/Shunts: No atrial level shunt detected by color flow Doppler.  LEFT VENTRICLE PLAX 2D LV EF:         Left            Diastology                ventricular     LV e' medial:  5.77 cm/s                ejection        LV E/e' medial:  18.7                fraction by     LV e' lateral:   8.05 cm/s                PLAX is 62      LV E/e' lateral: 13.4                %. LVIDd:         5.30 cm LVIDs:         3.50 cm LV PW:         1.00 cm LV IVS:        1.00 cm LVOT diam:     2.20 cm LV SV:         76 LV SV Index:   36 LVOT Area:     3.80 cm  RIGHT VENTRICLE         IVC TAPSE (M-mode): 0.6 cm  IVC diam: 3.80 cm LEFT ATRIUM             Index        RIGHT ATRIUM           Index LA diam:        4.40 cm 2.07 cm/m   RA Area:     27.50 cm LA Vol (A2C):   50.1 ml 23.51 ml/m  RA Volume:   101.00 ml 47.41 ml/m LA Vol (A4C):   26.2 ml 12.30 ml/m LA Biplane Vol: 38.8 ml 18.21 ml/m  AORTIC VALVE LVOT Vmax:    102.00 cm/s LVOT Vmean:  72.200 cm/s LVOT VTI:    0.201 m  AORTA Ao Root diam: 3.90 cm Ao Asc diam:  3.50 cm MITRAL VALVE                TRICUSPID VALVE MV Area (PHT): 4.77 cm     TR Peak grad:   27.7 mmHg MV Decel Time: 159 msec     TR Vmax:        263.00 cm/s MV E velocity: 108.00 cm/s                             SHUNTS                             Systemic VTI:  0.20 m                             Systemic Diam: 2.20 cm Zoila Shutter MD Electronically signed by Zoila Shutter MD Signature Date/Time: 10/26/2022/2:41:21 PM    Final       Signature  -   Susa Raring M.D on 10/30/2022 at 8:38 AM   -  To page go to [email or web address]

## 2022-10-30 NOTE — Interval H&P Note (Signed)
History and Physical Interval Note:  10/30/2022 8:40 AM  Mason Hill  has presented today for surgery, with the diagnosis of bactermeia.  The various methods of treatment have been discussed with the patient and family. After consideration of risks, benefits and other options for treatment, the patient has consented to  Procedure(s): TRANSESOPHAGEAL ECHOCARDIOGRAM (N/A) as a surgical intervention.  The patient's history has been reviewed, patient examined, no change in status, stable for surgery.  I have reviewed the patient's chart and labs.  Questions were answered to the patient's satisfaction.     Giuseppe Duchemin Cristal Deer

## 2022-10-30 NOTE — Anesthesia Preprocedure Evaluation (Signed)
Anesthesia Evaluation  Patient identified by MRN, date of birth, ID band Patient awake    Reviewed: Allergy & Precautions, H&P , NPO status , Patient's Chart, lab work & pertinent test results  Airway Mallampati: II  TM Distance: >3 FB Neck ROM: Full    Dental no notable dental hx.    Pulmonary    Pulmonary exam normal breath sounds clear to auscultation       Cardiovascular hypertension, + Past MI and +CHF  Normal cardiovascular exam+ dysrhythmias Atrial Fibrillation  Rhythm:Regular Rate:Normal  Moderate pericardial effusion   Neuro/Psych negative neurological ROS  negative psych ROS   GI/Hepatic negative GI ROS, Neg liver ROS,,,  Endo/Other  negative endocrine ROSdiabetes    Renal/GU Renal disease  negative genitourinary   Musculoskeletal negative musculoskeletal ROS (+)    Abdominal   Peds negative pediatric ROS (+)  Hematology negative hematology ROS (+)   Anesthesia Other Findings   Reproductive/Obstetrics negative OB ROS                              Anesthesia Physical Anesthesia Plan  ASA: 4  Anesthesia Plan: MAC   Post-op Pain Management:    Induction: Intravenous  PONV Risk Score and Plan: Propofol infusion and Treatment may vary due to age or medical condition  Airway Management Planned: Natural Airway  Additional Equipment:   Intra-op Plan:   Post-operative Plan:   Informed Consent: I have reviewed the patients History and Physical, chart, labs and discussed the procedure including the risks, benefits and alternatives for the proposed anesthesia with the patient or authorized representative who has indicated his/her understanding and acceptance.     Dental advisory given  Plan Discussed with: CRNA  Anesthesia Plan Comments: (82 y.o. male with history of hypertension, CKD stage III, hyperlipidemia, diabetes type 2, chronic diastolic CHF, paroxysmal A-fib,  coronary artery disease, hypertension, hyperlipidemia, peripheral artery disease.   Newly diagnosed bacteremia. Moderate pericardial effusion on TTE. TEE today to rule out endocarditis)         Anesthesia Quick Evaluation

## 2022-10-30 NOTE — Transfer of Care (Addendum)
Immediate Anesthesia Transfer of Care Note  Patient: Mason Hill  Procedure(s) Performed: TRANSESOPHAGEAL ECHOCARDIOGRAM  Patient Location: Cath Lab  Anesthesia Type:MAC  Level of Consciousness: drowsy and patient cooperative  Airway & Oxygen Therapy: Patient Spontanous Breathing and Patient connected to nasal cannula oxygen  Post-op Assessment: Report given to RN, Post -op Vital signs reviewed and stable, and Patient moving all extremities X 4  Post vital signs: Reviewed and stable  Last Vitals:  Vitals Value Taken Time  BP 109/51   Temp    Pulse 67 10/30/22 0921  Resp 25 10/30/22 0921  SpO2 94 % 10/30/22 0921  Vitals shown include unfiled device data.  Last Pain:  Vitals:   10/30/22 0745  TempSrc: Temporal  PainSc:          Complications: No notable events documented.

## 2022-10-30 NOTE — Progress Notes (Addendum)
[facility] Infectious Diseases Follow Up Note  Patient Identification: Patient Name: Mason Hill MRN: [id number] Admit Date: 10/25/2022  2:12 PM Age: 82 y.o.Today's Date: 10/30/2022  Reason for Visit: MV endocarditis   Principal Problem:   Severe sepsis (HCC) Active Problems:   CAP (community acquired pneumonia)   Pericardial effusion   Elevated troponin   Essential hypertension   Chronic kidney disease (CKD), stage III (moderate) (HCC)   Paroxysmal atrial fibrillation (HCC)   DM (diabetes mellitus) type II controlled with renal manifestation (HCC)   Chronic systolic CHF (congestive heart failure) (HCC)   History of coronary artery disease - small OM occlusion; med Rx   Hypokalemia   Malnutrition of moderate degree   Endocarditis of mitral valve   Antibiotics: Penicillin G 8/30-c  Lines/Hardwares:   Interval Events: Remains afebrile, labs remarkable for no leukocytosis   Assessment 82 year old male HTN, DM, CKD, HLD,  A-fib, CHF, MI/CAD who presented to the ED from home with sudden onset substernal chest pain/midepigastric pain.  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  Will not add aminoglycosides given no strong evidence of benefit as well as given his elderly age with likely harm than benefit. -Unlikely to be a surgical candidate with small vegetation with no gross valvulopathy  but defer to Cardiology or CVTS -ID  pharmacy to place OPAT orders  -ID will so, please recall if needed   OPAT  Diagnosis: Native MV endocarditis   Culture Result: Group B  strep   Allergies  Allergen Reactions   Codeine Other (See Comments) and Rash    Other Reaction: BLISTERING, PEELING.   Erythromycin    Isosorbide Nitrate     Other reaction(s): Headache    OPAT Orders Discharge antibiotics to be given via PICC line Discharge antibiotics: Pen G  Per pharmacy protocol  Duration: 6 weeks  End Date: 12/07/22  [facility] Care Per Protocol:  Home health RN for IV administration and teaching; PICC line care and labs.    Labs weekly while on IV antibiotics: X__ CBC with differential X__ BMP __ CMP X__ CRP X__ ESR __ Vancomycin trough __ CK  X__ Please pull PIC at completion of IV antibiotics __ Please leave PIC in place until doctor has seen patient or been notified  Fax weekly labs to [phone]  Clinic Follow Up Appt: 10/8 at 2 pm   Rest of the management as per the primary team. Thank you for the consult. Please page with pertinent questions or concerns.  ______________________________________________________________________ Subjective patient seen and examined at the bedside. No complaints. Discussed findings of TEE and abtx plan   Past Medical History:  Diagnosis Date   A-fib [facility] [place])    AMI (acute myocardial infarction) (HCC)    Diabetes mellitus without complication (HCC)    Hypertension    Past Surgical History:  Procedure Laterality Date   HERNIA REPAIR     TEE WITHOUT CARDIOVERSION N/A 10/30/2022   Procedure: TRANSESOPHAGEAL ECHOCARDIOGRAM;  Surgeon: [name], MD;  Location: [facility] INVASIVE CV LAB;  Service: Cardiovascular;  Laterality: N/A;    Vitals BP 102/65   Pulse 68   Temp 98.2 F (  36.8 C) (Temporal)   Resp (!) 24   Ht 6' (1.829 m)   Wt 100.2 kg   SpO2 92%   BMI 29.97 kg/m     Physical Exam Constitutional: Elderly male lying in the bed and appears comfortable.  Daughter at bedside    Comments:   Cardiovascular:     Rate and Rhythm: Normal rate and regular rhythm.     Heart sounds:  Pulmonary:      Effort: Pulmonary effort is normal.     Comments:   Abdominal:     Palpations: Abdomen is soft.     Tenderness: Nondistended  Musculoskeletal:        General: No swelling or tenderness in peripheral joints, chronic lower extremity lymphedema  Skin:    Comments: No rashes  Neurological:     General: Awake, alert and oriented, following commands  Psychiatric:        Mood and Affect: Mood normal.   Pertinent Microbiology Results for orders placed or performed during the hospital encounter of 10/25/22  Culture, blood (Routine x 2)     Status: Abnormal   Collection Time: 10/25/22  2:23 PM   Specimen: BLOOD RIGHT ARM  Result Value Ref Range Status   Specimen Description BLOOD RIGHT ARM  Final   Special Requests   Final    BOTTLES DRAWN AEROBIC AND ANAEROBIC Blood Culture results may not be optimal due to an excessive volume of blood received in culture bottles   Culture  Setup Time   Final    GRAM POSITIVE COCCI IN BOTH AEROBIC AND ANAEROBIC BOTTLES CRITICAL VALUE NOTED.  VALUE IS CONSISTENT WITH PREVIOUSLY REPORTED AND CALLED VALUE.    Culture (A)  Final    GROUP B STREP(S.AGALACTIAE)ISOLATED SUSCEPTIBILITIES PERFORMED ON PREVIOUS CULTURE WITHIN THE LAST 5 DAYS. Performed at [facility] Lab, [address] [place], [place] [address]    Report Status 10/28/2022 FINAL  Final  SARS Coronavirus 2 by RT PCR (hospital order, performed in [facility] hospital lab) *cepheid single result test* Anterior Nasal Swab     Status: None   Collection Time: 10/25/22  2:30 PM   Specimen: Anterior Nasal Swab  Result Value Ref Range Status   SARS Coronavirus 2 by RT PCR NEGATIVE NEGATIVE Final    Comment: Performed at [facility] Lab, [address] [place], [place] [address]  Culture, blood (Routine x 2)     Status: Abnormal   Collection Time: 10/25/22  2:50 PM   Specimen: BLOOD RIGHT HAND  Result Value Ref Range Status   Specimen Description BLOOD RIGHT HAND  Final   Special  Requests   Final    BOTTLES DRAWN AEROBIC AND ANAEROBIC Blood Culture adequate volume   Culture  Setup Time   Final    GRAM POSITIVE COCCI ANAEROBIC BOTTLE ONLY CRITICAL RESULT CALLED TO, READ BACK BY AND VERIFIED WITH: J [facility]  10/26/22 MK Performed at [facility] Lab, [address], [place], [place] [address]    Culture GROUP B STREP(S.AGALACTIAE)ISOLATED (A)  Final   Report Status 10/28/2022 FINAL  Final   Organism ID, Bacteria GROUP B STREP(S.AGALACTIAE)ISOLATED  Final      Susceptibility   Group b strep(s.agalactiae)isolated - MIC*    CLINDAMYCIN >=1 RESISTANT Resistant     AMPICILLIN <=0.25 SENSITIVE Sensitive     ERYTHROMYCIN >=8 RESISTANT Resistant     VANCOMYCIN 0.5 SENSITIVE Sensitive     CEFTRIAXONE <=0.12 SENSITIVE Sensitive     LEVOFLOXACIN 1  SENSITIVE Sensitive     PENICILLIN <=0.06 SENSITIVE Sensitive     * GROUP B STREP(S.AGALACTIAE)ISOLATED  Blood Culture ID Panel (Reflexed)     Status: Abnormal   Collection Time: 10/25/22  2:50 PM  Result Value Ref Range Status   Enterococcus faecalis NOT DETECTED NOT DETECTED Final   Enterococcus Faecium NOT DETECTED NOT DETECTED Final   Listeria monocytogenes NOT DETECTED NOT DETECTED Final   Staphylococcus species NOT DETECTED NOT DETECTED Final   Staphylococcus aureus (BCID) NOT DETECTED NOT DETECTED Final   Staphylococcus epidermidis NOT DETECTED NOT DETECTED Final   Staphylococcus lugdunensis NOT DETECTED NOT DETECTED Final   Streptococcus species DETECTED (A) NOT DETECTED Final    Comment: CRITICAL RESULT CALLED TO, READ BACK BY AND VERIFIED WITH: J WYLAND,PHARMD@[id number]  10/26/22 MK    Streptococcus agalactiae DETECTED (A) NOT DETECTED Final    Comment: CRITICAL RESULT CALLED TO, READ BACK BY AND VERIFIED WITH: J WYLAND,PHARMD@0525  10/26/22 MK    Streptococcus pneumoniae NOT DETECTED NOT DETECTED Final   Streptococcus pyogenes NOT DETECTED NOT DETECTED Final   A.calcoaceticus-baumannii NOT DETECTED NOT  DETECTED Final   Bacteroides fragilis NOT DETECTED NOT DETECTED Final   Enterobacterales NOT DETECTED NOT DETECTED Final   Enterobacter cloacae complex NOT DETECTED NOT DETECTED Final   Escherichia coli NOT DETECTED NOT DETECTED Final   Klebsiella aerogenes NOT DETECTED NOT DETECTED Final   Klebsiella oxytoca NOT DETECTED NOT DETECTED Final   Klebsiella pneumoniae NOT DETECTED NOT DETECTED Final   Proteus species NOT DETECTED NOT DETECTED Final   Salmonella species NOT DETECTED NOT DETECTED Final   Serratia marcescens NOT DETECTED NOT DETECTED Final   Haemophilus influenzae NOT DETECTED NOT DETECTED Final   Neisseria meningitidis NOT DETECTED NOT DETECTED Final   Pseudomonas aeruginosa NOT DETECTED NOT DETECTED Final   Stenotrophomonas maltophilia NOT DETECTED NOT DETECTED Final   Candida albicans NOT DETECTED NOT DETECTED Final   Candida auris NOT DETECTED NOT DETECTED Final   Candida glabrata NOT DETECTED NOT DETECTED Final   Candida krusei NOT DETECTED NOT DETECTED Final   Candida parapsilosis NOT DETECTED NOT DETECTED Final   Candida tropicalis NOT DETECTED NOT DETECTED Final   Cryptococcus neoformans/gattii NOT DETECTED NOT DETECTED Final    Comment: Performed at [facility] Lab, [address], [place], [place] [address]  Culture, blood (Routine X 2) w Reflex to ID Panel     Status: None (Preliminary result)   Collection Time: 10/27/22  6:50 AM   Specimen: BLOOD LEFT ARM  Result Value Ref Range Status   Specimen Description BLOOD LEFT ARM  Final   Special Requests   Final    BOTTLES DRAWN AEROBIC AND ANAEROBIC Blood Culture adequate volume   Culture   Final    NO GROWTH 3 DAYS Performed at [facility] Lab, [address], [place], [place] [address]    Report Status PENDING  Incomplete  Culture, blood (Routine X 2) w Reflex to ID Panel     Status: None (Preliminary result)   Collection Time: 10/27/22  6:59 AM   Specimen: BLOOD RIGHT ARM  Result Value Ref Range Status    Specimen Description BLOOD RIGHT ARM  Final   Special Requests   Final    BOTTLES DRAWN AEROBIC AND ANAEROBIC Blood Culture results may not be optimal due to an excessive volume of blood received in culture bottles   Culture   Final    NO GROWTH 3 DAYS Performed at [facility], [address] [place], [place] [address]    Report Status PENDING  Incomplete   Pertinent Lab.    Latest Ref Rng & Units 10/30/2022    6:49 AM 10/29/2022    2:59 AM 10/28/2022    3:34 AM  CBC  WBC 4.0 - 10.5 K/uL 7.1  8.3  12.8   Hemoglobin 13.0 - 17.0 g/dL 84.6  96.2  95.2   Hematocrit 39.0 - 52.0 % 33.8  34.4  32.0   Platelets 150 - 400 K/uL 179  157  133       Latest Ref Rng & Units 10/30/2022    6:49 AM 10/29/2022    2:59 AM 10/28/2022    3:34 AM  CMP  Glucose 70 - 99 mg/dL 841  324  401   BUN 8 - 23 mg/dL 18  25  26    Creatinine 0.61 - 1.24 mg/dL 0.27  2.53  6.64   Sodium 135 - 145 mmol/L 139  137  139   Potassium 3.5 - 5.1 mmol/L 3.9  4.1  3.9   Chloride 98 - 111 mmol/L 99  99  101   CO2 22 - 32 mmol/L 31  28  30    Calcium 8.9 - 10.3 mg/dL 8.2  8.2  8.0   Total Protein 6.5 - 8.1 g/dL 4.7  4.5  4.4   Total Bilirubin 0.3 - 1.2 mg/dL 0.5  0.5  0.6   Alkaline Phos 38 - 126 U/L 100  99  83   AST 15 - 41 U/L 29  32  26   ALT 0 - 44 U/L 18  16  13       Pertinent Imaging today Plain films and CT images have been personally visualized and interpreted; radiology reports have been reviewed. Decision making incorporated into the Impression /   EP STUDY  Result Date: 10/30/2022 See surgical note for result.  DG Chest Port 1 View  Result Date: 10/27/2022 CLINICAL DATA:  82 year old male shortness of breath. EXAM: PORTABLE CHEST 1 VIEW COMPARISON:  CT Chest, Abdomen, and Pelvis 10/25/2022 and earlier. FINDINGS: Portable AP semi upright view at 0637 hours. Cardiomegaly. Other mediastinal contours are within normal limits. Visualized tracheal air column is within normal limits. Retrocardiac opacity is  stable, with left pleural effusion and atelectasis recently demonstrated on CT. No pneumothorax or pulmonary edema. Stable right lung ventilation. No acute osseous abnormality identified. IMPRESSION: Stable cardiomegaly and left pleural effusion with atelectasis. Electronically Signed   By: Odessa Fleming M.D.   On: 10/27/2022 07:00   ECHOCARDIOGRAM COMPLETE  Result Date: 10/26/2022    ECHOCARDIOGRAM REPORT   Patient Name:   Mason Hill Date of Exam: 10/26/2022 Medical Rec #:  [id number]    Height:       72.0 in Accession #:    [id number]   Weight:       200.0 lb Date of Birth:  1940-08-23    BSA:          2.131 m Patient Age:    82 years     BP:           122/55 mmHg Patient Gender: M            HR:           86 bpm. Exam Location:  Inpatient Procedure: 2D Echo, Cardiac Doppler and Color Doppler Indications:    NSTEMI I21.4  History:        Patient has no prior history of Echocardiogram  examinations.  Sonographer:    Harriette Bouillon RDCS Referring Phys: [phone] SUBRINA SUNDIL IMPRESSIONS  1. Left ventricular ejection fraction, by estimation, is 60 to 65%. Left ventricular ejection fraction by PLAX is 62 %. The left ventricle has normal function. The left ventricle has no regional wall motion abnormalities. Left ventricular diastolic parameters are indeterminate. There is the interventricular septum is flattened in systole, consistent with right ventricular pressure overload.  2. Right ventricular systolic function is severely reduced. The right ventricular size is moderately enlarged. There is mildly elevated pulmonary artery systolic pressure. The estimated right ventricular systolic pressure is 42.7 mmHg.  3. Right atrial size was severely dilated.  4. Pericardial fluid measures 1.36 cm over the RV apex and up to 2.29 cm around the right atrium.. Moderate pericardial effusion. The pericardial effusion is anterior to the right ventricle.  5. The mitral valve is degenerative. Trivial mitral valve regurgitation.  6. The  aortic valve is tricuspid. Aortic valve regurgitation is not visualized.  7. Aortic dilatation noted. There is borderline dilatation of the aortic root, measuring 39 mm.  8. The inferior vena cava is dilated in size with <50% respiratory variability, suggesting right atrial pressure of 15 mmHg. Comparison(s): No prior Echocardiogram. Conclusion(s)/Recommendation(s): Moderate sized pericardial effusion. Cannot exclude tamponade physiology given severe RV dysfunction. Clinical correlation is advised. FINDINGS  Left Ventricle: Left ventricular ejection fraction, by estimation, is 60 to 65%. Left ventricular ejection fraction by PLAX is 62 %. The left ventricle has normal function. The left ventricle has no regional wall motion abnormalities. The left ventricular internal cavity size was normal in size. There is no left ventricular hypertrophy. The interventricular septum is flattened in systole, consistent with right ventricular pressure overload. Left ventricular diastolic parameters are indeterminate. Right Ventricle: The right ventricular size is moderately enlarged. No increase in right ventricular wall thickness. Right ventricular systolic function is severely reduced. There is mildly elevated pulmonary artery systolic pressure. The tricuspid regurgitant velocity is 2.63 m/s, and with an assumed right atrial pressure of 15 mmHg, the estimated right ventricular systolic pressure is 42.7 mmHg. Left Atrium: Left atrial size was normal in size. Right Atrium: Right atrial size was severely dilated. Pericardium: Pericardial fluid measures 1.36 cm over the RV apex and up to 2.29 cm around the right atrium. A moderately sized pericardial effusion is present. The pericardial effusion is anterior to the right ventricle. Mitral Valve: The mitral valve is degenerative in appearance. There is mild calcification of the anterior and posterior mitral valve leaflet(s). Mild to moderate mitral annular calcification. Trivial mitral  valve regurgitation. Tricuspid Valve: The tricuspid valve is grossly normal. Tricuspid valve regurgitation is mild. Aortic Valve: The aortic valve is tricuspid. Aortic valve regurgitation is not visualized. Pulmonic Valve: The pulmonic valve was grossly normal. Pulmonic valve regurgitation is trivial. Aorta: Aortic dilatation noted. There is borderline dilatation of the aortic root, measuring 39 mm. Venous: The inferior vena cava is dilated in size with less than 50% respiratory variability, suggesting right atrial pressure of 15 mmHg. IAS/Shunts: No atrial level shunt detected by color flow Doppler.  LEFT VENTRICLE PLAX 2D LV EF:         Left            Diastology                ventricular     LV e' medial:    5.77 cm/s                ejection  LV E/e' medial:  18.7                fraction by     LV e' lateral:   8.05 cm/s                PLAX is 62      LV E/e' lateral: 13.4                %. LVIDd:         5.30 cm LVIDs:         3.50 cm LV PW:         1.00 cm LV IVS:        1.00 cm LVOT diam:     2.20 cm LV SV:         76 LV SV Index:   36 LVOT Area:     3.80 cm  RIGHT VENTRICLE         IVC TAPSE (M-mode): 0.6 cm  IVC diam: 3.80 cm LEFT ATRIUM             Index        RIGHT ATRIUM           Index LA diam:        4.40 cm 2.07 cm/m   RA Area:     27.50 cm LA Vol (A2C):   50.1 ml 23.51 ml/m  RA Volume:   101.00 ml 47.41 ml/m LA Vol (A4C):   26.2 ml 12.30 ml/m LA Biplane Vol: 38.8 ml 18.21 ml/m  AORTIC VALVE LVOT Vmax:   102.00 cm/s LVOT Vmean:  72.200 cm/s LVOT VTI:    0.201 m  AORTA Ao Root diam: 3.90 cm Ao Asc diam:  3.50 cm MITRAL VALVE                TRICUSPID VALVE MV Area (PHT): 4.77 cm     TR Peak grad:   27.7 mmHg MV Decel Time: 159 msec     TR Vmax:        263.00 cm/s MV E velocity: 108.00 cm/s                             SHUNTS                             Systemic VTI:  0.20 m                             Systemic Diam: 2.20 cm Zoila Shutter MD Electronically signed by Zoila Shutter MD  Signature Date/Time: 10/26/2022/2:41:21 PM    Final    CT CHEST ABDOMEN PELVIS W CONTRAST  Result Date: 10/25/2022 CLINICAL DATA:  Chest pain EXAM: CT CHEST, ABDOMEN, AND PELVIS WITH CONTRAST TECHNIQUE: Multidetector CT imaging of the chest, abdomen and pelvis was performed following the standard protocol during bolus administration of intravenous contrast. RADIATION DOSE REDUCTION: This exam was performed according to the departmental dose-optimization program which includes automated exposure control, adjustment of the mA and/or kV according to patient size and/or use of iterative reconstruction technique. CONTRAST:  75mL OMNIPAQUE IOHEXOL 350 MG/ML SOLN COMPARISON:  None Available. FINDINGS: Limited exam due to anasarca and streak artifact related to patient arm position. CT CHEST FINDINGS Cardiovascular: Cardiomegaly. Moderate pericardial effusion. Caliber thoracic aorta with moderate atherosclerotic disease. Severe coronary artery calcifications.  Mediastinum/Nodes: Esophagus and thyroid are unremarkable. No enlarged lymph nodes seen in the chest. Lungs/Pleura: Central airways are patent. Small left-greater-than-right pleural effusions and atelectasis. No pneumothorax. Musculoskeletal: No chest wall mass or suspicious bone lesions identified. CT ABDOMEN PELVIS FINDINGS Hepatobiliary: No focal liver abnormality is seen, although evaluation of the liver is greatly limited due to artifact. Gallbladder is grossly normal. No evidence of biliary ductal dilation. Pancreas: Unremarkable. Spleen: Normal in size without focal abnormality. Adrenals/Urinary Tract: Bilateral adrenal glands are unremarkable. No hydronephrosis. Nonobstructing left renal stone. Bilateral low-attenuation renal lesions which are likely simple cysts, artifact limits evaluation, no specific follow-up imaging is necessary. Bladder wall thickening. Stomach/Bowel: Stomach is within normal limits. No definite bowel wall thickening, although  evaluation of the bowel loops is markedly limited. No evidence of obstruction. Vascular/Lymphatic: Aortic atherosclerosis. Mildly enlarged periaortic lymph node measuring 12 mm in short axis on series 3, image 75. Bilateral enlarged inguinal lymph nodes. Reference left inguinal lymph node measuring 1.6 mm in short axis on series 3, image 127. Reproductive: Prostate is unremarkable. Other: Diffuse body wall edema. Small fat containing periumbilical hernia. Diffuse mesenteric stranding and trace abdominopelvic ascites. Musculoskeletal: No acute or significant osseous findings. IMPRESSION: 1. Cardiomegaly and moderate pericardial effusion. 2. Small left-greater-than-right pleural effusions and atelectasis. 3. Evaluation of the abdomen and pelvis is markedly limited due to streak artifact and diffuse anasarca. 4. Diffuse mesenteric stranding and trace abdominopelvic ascites, possibly related to volume status. 5. Bladder wall thickening, findings can be seen in the setting of cystitis. Correlate with urinalysis. 6. Enlarged periaortic and bilateral inguinal lymph nodes, possibly reactive. Consider follow-up CT of the abdomen and pelvis in 3 months to ensure resolution. 7. Severe coronary artery calcifications and aortic Atherosclerosis (ICD10-I70.0). Electronically Signed   By: Allegra Lai M.D.   On: 10/25/2022 18:41   DG Chest Port 1 View  Result Date: 10/25/2022 CLINICAL DATA:  Shortness of breath EXAM: PORTABLE CHEST 1 VIEW COMPARISON:  X-ray 04/14/2009 FINDINGS: Enlarged cardiopericardial silhouette. Calcified aorta. Prominent central vasculature. Tiny left effusion. Mild lung base opacities. Overlapping cardiac leads. IMPRESSION: Enlarged cardiopericardial silhouette. Tiny left effusion. Mild lung base opacities, left-greater-than-right. Subtle infiltrates possible. Recommend follow-up. Electronically Signed   By: Karen Kays M.D.   On: 10/25/2022 15:21    I have personally spent 51 minutes involved in  face-to-face and non-face-to-face activities for this patient on the day of the visit. Professional time spent includes the following activities: Preparing to see the patient (review of tests), Obtaining and/or reviewing separately obtained history (admission/discharge record), Performing a medically appropriate examination and/or evaluation , Ordering medications/tests/procedures, referring and communicating with other health care professionals, Documenting clinical information in the EMR, Independently interpreting results (not separately reported), Communicating results to the patient/family/caregiver, Counseling and educating the patient/family/caregiver and Care coordination (not separately reported).   Plan d/w requesting provider as well as ID pharm D  Note: This document was prepared using dragon voice recognition software and may include unintentional dictation errors.   Electronically signed by:   Odette Fraction, MD Infectious Disease Physician [facility] Pager: [phone]

## 2022-10-30 NOTE — Progress Notes (Signed)
Orthopedic Tech Progress Note Patient Details:  Mason Hill [birth year] [id number]  Called floor to speak wit RN she was tied up at the time. I did let them know I will be up around 1500 to do his UNNA BOOTS   Patient ID: Mason Hill, male   DOB: May 16, 1940, 82 y.o.   MRN: [id number]  [name] 10/30/2022, 11:17 AM

## 2022-10-30 NOTE — Plan of Care (Signed)

## 2022-10-30 NOTE — Anesthesia Postprocedure Evaluation (Signed)
Anesthesia Post Note  Patient: Mason Hill  Procedure(s) Performed: TRANSESOPHAGEAL ECHOCARDIOGRAM     Patient location during evaluation: PACU Anesthesia Type: MAC Level of consciousness: awake and alert Pain management: pain level controlled Vital Signs Assessment: post-procedure vital signs reviewed and stable Respiratory status: spontaneous breathing, nonlabored ventilation, respiratory function stable and patient connected to nasal cannula oxygen Cardiovascular status: stable and blood pressure returned to baseline Postop Assessment: no apparent nausea or vomiting Anesthetic complications: no   No notable events documented.  Last Vitals:  Vitals:   10/30/22 0925 10/30/22 0935  BP: (!) 93/55 102/65  Pulse: 67 68  Resp: (!) 21 (!) 24  Temp:  36.8 C  SpO2: 95% 92%    Last Pain:  Vitals:   10/30/22 1200  TempSrc:   PainSc: 0-No pain                 [place]

## 2022-10-31 ENCOUNTER — Inpatient Hospital Stay (HOSPITAL_COMMUNITY): Payer: Medicare Other

## 2022-10-31 DIAGNOSIS — A419 Sepsis, unspecified organism: Secondary | ICD-10-CM | POA: Diagnosis not present

## 2022-10-31 DIAGNOSIS — R6520 Severe sepsis without septic shock: Secondary | ICD-10-CM | POA: Diagnosis not present

## 2022-10-31 DIAGNOSIS — I059 Rheumatic mitral valve disease, unspecified: Secondary | ICD-10-CM

## 2022-10-31 DIAGNOSIS — J189 Pneumonia, unspecified organism: Secondary | ICD-10-CM | POA: Diagnosis not present

## 2022-10-31 LAB — GLUCOSE, CAPILLARY
Glucose-Capillary: 165 mg/dL — ABNORMAL HIGH (ref 70–99)
Glucose-Capillary: 195 mg/dL — ABNORMAL HIGH (ref 70–99)
Glucose-Capillary: 196 mg/dL — ABNORMAL HIGH (ref 70–99)
Glucose-Capillary: 163 mg/dL — ABNORMAL HIGH (ref 70–99)
Glucose-Capillary: 150 mg/dL — ABNORMAL HIGH (ref 70–99)

## 2022-10-31 LAB — COMPREHENSIVE METABOLIC PANEL
ALT: 25 U/L (ref 0–44)
AST: 34 U/L (ref 15–41)
Albumin: 1.7 g/dL — ABNORMAL LOW (ref 3.5–5.0)
Alkaline Phosphatase: 116 U/L (ref 38–126)
Anion gap: 9 (ref 5–15)
BUN: 19 mg/dL (ref 8–23)
CO2: 29 mmol/L (ref 22–32)
Calcium: 8.2 mg/dL — ABNORMAL LOW (ref 8.9–10.3)
Chloride: 97 mmol/L — ABNORMAL LOW (ref 98–111)
Creatinine, Ser: 0.91 mg/dL (ref 0.61–1.24)
GFR, Estimated: >60 mL/min (ref >60)
Glucose, Bld: 183 mg/dL — ABNORMAL HIGH (ref 70–99)
Potassium: 4.2 mmol/L (ref 3.5–5.1)
Sodium: 135 mmol/L (ref 135–145)
Total Bilirubin: 0.7 mg/dL (ref 0.3–1.2)
Total Protein: 5 g/dL — ABNORMAL LOW (ref 6.5–8.1)

## 2022-10-31 LAB — CBC
HCT: 35.2 % — ABNORMAL LOW (ref 39.0–52.0)
Hemoglobin: 11.5 g/dL — ABNORMAL LOW (ref 13.0–17.0)
MCH: 30.1 pg (ref 26.0–34.0)
MCHC: 32.7 g/dL (ref 30.0–36.0)
MCV: 92.1 fL (ref 80.0–100.0)
Platelets: 239 10*3/uL (ref 150–400)
RBC: 3.82 MIL/uL — ABNORMAL LOW (ref 4.22–5.81)
RDW: 13.4 % (ref 11.5–15.5)
WBC: 10.1 10*3/uL (ref 4.0–10.5)
nRBC: 0 % (ref 0.0–0.2)

## 2022-10-31 LAB — PROCALCITONIN: Procalcitonin: 0.88 ng/mL

## 2022-10-31 LAB — MAGNESIUM: Magnesium: 1.8 mg/dL (ref 1.7–2.4)

## 2022-10-31 LAB — C-REACTIVE PROTEIN: CRP: 5 mg/dL — ABNORMAL HIGH (ref <1.0)

## 2022-10-31 MED ORDER — SODIUM CHLORIDE 0.9% FLUSH: 10.0000 mL | INTRAVENOUS | Status: DC | PRN

## 2022-10-31 MED ORDER — SPIRONOLACTONE 25 MG PO TABS
25.0000 mg | ORAL_TABLET | Freq: Once | ORAL | Status: AC
  Administered 2022-10-31: 25 mg via ORAL
  Filled 2022-10-31: qty 1

## 2022-10-31 MED ORDER — CHLORHEXIDINE GLUCONATE CLOTH 2 % EX PADS
6.0000 | MEDICATED_PAD | Freq: Every day | CUTANEOUS | Status: DC
  Administered 2022-10-31 – 2022-11-03 (×4): 6 via TOPICAL

## 2022-10-31 MED ORDER — SPIRONOLACTONE 25 MG PO TABS: 25.0000 mg | ORAL_TABLET | Freq: Every day | ORAL | Status: DC

## 2022-10-31 MED ORDER — PENICILLIN G POTASSIUM IV (FOR PTA / DISCHARGE USE ONLY): 24.0000 10*6.[IU] | INTRAVENOUS | 0 refills | Status: AC

## 2022-10-31 MED ORDER — SPIRONOLACTONE 25 MG PO TABS
50.0000 mg | ORAL_TABLET | Freq: Every day | ORAL | Status: DC
  Administered 2022-11-01 – 2022-11-03 (×3): 50 mg via ORAL
  Filled 2022-10-31 (×3): qty 2

## 2022-10-31 NOTE — Progress Notes (Signed)
Heart Failure Navigator Progress Note  Assessed for Heart & Vascular TOC clinic readiness.  Patient does not meet criteria due to patient wishing to keep cardiologist at [facility].   Navigator will sign off at this time.    Fayne Mcguffee,RN, BSN,MSN Heart Failure Nurse Navigator. Contact by secure chat only.

## 2022-10-31 NOTE — Progress Notes (Signed)
PROGRESS NOTE                                                                                                                                                                                                             Patient Demographics:    Mason Hill, is a 82 y.o. male, DOB - Dec 04, 1940, ZOX:[id number]  Outpatient Primary MD for the patient is Hill, Mason Lucy, MD    LOS - 6  Admit date - 10/25/2022    Chief Complaint  Patient presents with   Chest Pain       Brief Narrative (HPI from H&P)    82 year old male with history of hypertension, CKD stage III, hyperlipidemia, diabetes type 2, chronic diastolic CHF, paroxysmal A-fib, coronary artery disease, hypertension, hyperlipidemia, peripheral artery disease require intervention who initially presented with midepigastric pain, chest pain, abdominal pain, dry cough, progressive worsening of bilateral lower extremity swelling, dyspnea, orthopnea.  On presentation, blood pressure was soft, had mild fever but saturating fine on room air.  Respiratory viral panel came out of negative.  Lab work showed lactic acid of 2.5, potassium of 3.3, creatinine of 1.6, WBC count of 16.4, elevated BNP of 664, elevated troponin.  EKG showed A-fib.  Chest x-ray showed enlarged cardiac shadow, mid lung base opacity left greater than right.  CT abdomen/pelvis showed cardiomegaly, moderate pericardial effusion.  Patient was admitted for the management of possible sepsis secondary to pneumonia, +++ edema,  Cardiology ,ID also consulted.    Subjective:   Patient in bed, reports he is feeling better today, he just had his PICC line placed.   Assessment  & Plan :    Community-acquired pneumonia, strep agalactiae bacteremia /endocarditis  :Chest x-ray showed possible bilateral pneumonia left greater than right.  Blood culture positive for strep agalactiae, appreciate ID input, TTE shows no  vegetation, but TEE significant for vegetation, ID input greatly appreciated, recommendation for 6 weeks of IV antibiotics, PICC line inserted 9//2024    Enlarged periaortic/bilateral inguinal lymph nodes: Likely reactive.  Recommend follow-up CT abdomen/pelvis in 3 months, cardiology on board.   Anasarca due to combination of hypoalbuminemia, moderate protein calorie malnutrition and dCHF: Has low albumin.  Likely from moderate protein calorie malnutrition, UA has no proteinuria, LFTs are stable, on protein supplementation and diuresis with Lasix and Aldactone, add TED stockings as well.  Lasix dose increased further on 10/30/2022, still has about 7 to 8 L to diurese. -Continue with IV diuresis, management per cardiology.  Lactone increased to 50 mg daily   Pericardial effusion: Noted on echocardiogram, moderate, cardiology on board.  No tamponade physiology, continue with IV diuresis   Coronary artery disease/elevated troponin: Elevated with flat trend.  Complaint of chest pain on admission which has resolved.  Currently chest pain-free.  EKG showed A-fib, PVCs, minimal ST depression in the inferior leads.  Elevated troponin could be from demand ischemia.  Echocardiogram ordered.  Takes Lipitor at home.  Cardiology following   Paroxysmal A-fib: On Eliquis for anticoagulation, and low-dose beta-blocker, cardiology following.  Monitor on telemetry.  Currently in normal sinus rhythm   Acute on chronic diastolic CHF exacerbation EF 60%: Takes amlodipine, losartan, Jardiance at home.  Presented with anasarca, elevated BNP.  Chest x-ray showing cardiomegaly.  Continue diuretics, cardiology on board.   Hypertension: Currently antihypertensives on hold.  On losartan, spironolactone, Jardiance, Lasix at home.   Hypokalemia/hypomagnesemia: Currently being monitored and supplemented as needed.   Debility/deconditioning: Patient lives with his grandson.  Walks fine at home.  Continue PT.  Diabetes type 2:  A1c of 6.  Jardiance at home.  Currently on sliding scale   Lab Results  Component Value Date   HGBA1C 6.0 (H) 10/26/2022   CBG (last 3)  Recent Labs    10/31/22 0901 10/31/22 1140 10/31/22 1207  GLUCAP 165* 196* 195*          Condition -  Guarded  Family Communication  : Discussed with sister at bedside  Code Status :  Full  Consults  :  Cards, ID  PUD Prophylaxis :     Procedures  :     TEE due on 10/30/22  CT chest abdomen and pelvis- 1. Cardiomegaly and moderate pericardial effusion. 2. Small left-greater-than-right pleural effusions and atelectasis. 3. Evaluation of the abdomen and pelvis is markedly limited due to streak artifact and diffuse anasarca. 4. Diffuse mesenteric stranding and trace abdominopelvic ascites, possibly related to volume status. 5. Bladder wall thickening, findings can be seen in the setting of cystitis. Correlate with urinalysis. 6. Enlarged periaortic and bilateral inguinal lymph nodes, possibly reactive. Consider follow-up CT of the abdomen and pelvis in 3 months to ensure resolution. 7. Severe coronary artery calcifications and aortic Atherosclerosis (ICD10-I70.0).    TTE -  1. Left ventricular ejection fraction, by estimation, is 60 to 65%. Left ventricular ejection fraction by PLAX is 62 %. The left ventricle has normal function. The left ventricle has no regional wall motion abnormalities. Left ventricular diastolic parameters are indeterminate. There is the interventricular septum is flattened in systole, consistent with right ventricular pressure overload.  2. Right ventricular systolic function is severely reduced. The right ventricular size is moderately enlarged. There is mildly elevated pulmonary artery systolic pressure. The estimated right ventricular systolic pressure is 42.7 mmHg.  3. Right atrial size was severely dilated.  4. Pericardial fluid measures 1.36 cm over the RV apex and up to 2.29 cm around the right atrium.. Moderate  pericardial effusion. The pericardial effusion is anterior to the right ventricle.  5. The mitral valve is degenerative. Trivial mitral valve regurgitation.  6. The aortic valve is tricuspid. Aortic valve regurgitation is not visualized.  7. Aortic dilatation noted. There is borderline dilatation of the aortic root, measuring 39 mm.  8. The inferior vena cava is dilated in size with <50% respiratory variability, suggesting right atrial pressure  of 15 mmHg.      Disposition Plan  :    Status is: Inpatient   DVT Prophylaxis  :    Place TED hose Start: 10/27/22 0939 SCDs Start: 10/25/22 1934 apixaban (ELIQUIS) tablet 5 mg     Lab Results  Component Value Date   PLT 239 10/31/2022    Diet :  Diet Order             Diet Heart Room service appropriate? Yes; Fluid consistency: Thin; Fluid restriction: 1500 mL Fluid  Diet effective now                    Inpatient Medications  Scheduled Meds:  (feeding supplement) PROSource Plus  30 mL Oral BID BM   apixaban  5 mg Oral BID   aspirin EC  81 mg Oral Daily   atorvastatin  40 mg Oral Daily   Chlorhexidine Gluconate Cloth  6 each Topical Daily   feeding supplement  237 mL Oral BID BM   furosemide  80 mg Intravenous BID   insulin aspart  0-5 Units Subcutaneous QHS   insulin aspart  0-6 Units Subcutaneous TID WC   lactose free nutrition  237 mL Oral BID BM   metoprolol tartrate  25 mg Oral BID   multivitamin with minerals  1 tablet Oral Daily   sodium chloride flush  3 mL Intravenous Q12H   [START ON 11/01/2022] spironolactone  50 mg Oral Daily   Continuous Infusions:  sodium chloride     penicillin G potassium 12 Million Units in dextrose 5 % 500 mL CONTINUOUS infusion 12 Million Units (10/31/22 1032)   PRN Meds:.sodium chloride, acetaminophen **OR** acetaminophen, melatonin, ondansetron **OR** ondansetron (ZOFRAN) IV, sodium chloride flush, sodium chloride flush    Objective:   Vitals:   10/30/22 2043 10/31/22 0015  10/31/22 0400 10/31/22 0900  BP: (!) 101/57 101/65 111/63 113/72  Pulse: 71 70 70 70  Resp:  (!) 21 20   Temp:  98 F (36.7 C) 98.1 F (36.7 C) 97.7 F (36.5 C)  TempSrc:  Oral Oral Oral  SpO2: 93% 96% 94% 94%  Weight:      Height:        Wt Readings from Last 3 Encounters:  10/30/22 100.2 kg  07/17/19 91.2 kg     Intake/Output Summary (Last 24 hours) at 10/31/2022 1320 Last data filed at 10/31/2022 1216 Gross per 24 hour  Intake 1710.61 ml  Output 3000 ml  Net -1289.39 ml     Physical Exam  Awake Alert, Oriented X 3, No new F.N deficits, Normal affect Symmetrical Chest wall movement, Good air movement bilaterally, CTAB RRR,No Gallops,Rubs or new Murmurs, No Parasternal Heave +ve B.Sounds, Abd Soft, No tenderness, No rebound - guarding or rigidity. +2 edema, wearing Unna boots     Data Review:    Recent Labs  Lab 10/25/22 1451 10/26/22 0028 10/27/22 0650 10/28/22 0334 10/29/22 0259 10/30/22 0649 10/31/22 0700  WBC 16.4*   < > 16.8* 12.8* 8.3 7.1 10.1  HGB 11.2*   < > 10.6* 10.4* 10.9* 11.0* 11.5*  HCT 35.0*   < > 32.5* 32.0* 34.4* 33.8* 35.2*  PLT 174   < > 131* 133* 157 179 239  MCV 94.9   < > 94.2 96.4 94.8 92.9 92.1  MCH 30.4   < > 30.7 31.3 30.0 30.2 30.1  MCHC 32.0   < > 32.6 32.5 31.7 32.5 32.7  RDW 13.7   < >  14.2 14.0 13.5 13.4 13.4  LYMPHSABS 0.3*  --   --   --   --   --   --   MONOABS 1.6*  --   --   --   --   --   --   EOSABS 0.0  --   --   --   --   --   --   BASOSABS 0.1  --   --   --   --   --   --    < > = values in this interval not displayed.    Recent Labs  Lab 10/25/22 1451 10/25/22 1451 10/25/22 1500 10/25/22 1515 10/25/22 1723 10/25/22 1941 10/26/22 0028 10/26/22 0544 10/27/22 0650 10/28/22 0334 10/29/22 0259 10/30/22 0649 10/31/22 0700  NA 141  --   --   --   --   --  140  --  139 139 137 139 135  K 3.3*  --   --   --   --   --  4.1  --  3.7 3.9 4.1 3.9 4.2  CL 106  --   --   --   --   --  101  --  100 101 99 99 97*   CO2 27  --   --   --   --   --  26  --  28 30 28 31 29   ANIONGAP 8  --   --   --   --   --  13  --  11 8 10 9 9   GLUCOSE 105*  --   --   --   --   --  119*  --  127* 115* 144* 132* 183*  BUN 18  --   --   --   --   --  21  --  24* 26* 25* 18 19  CREATININE 1.16  --   --   --   --   --  1.15  --  1.07 1.14 1.07 0.95 0.91  AST 25  --   --   --   --   --  25  --  21 26 32 29 34  ALT 13  --   --   --   --   --  13  --  14 13 16 18 25   ALKPHOS 46  --   --   --   --   --  46  --  68 83 99 100 116  BILITOT 1.4*  --   --   --   --   --  1.0  --  0.7 0.6 0.5 0.5 0.7  ALBUMIN 1.9*  --   --   --   --   --  2.3*  --  1.8* 1.6* 1.7* 1.6* 1.7*  CRP  --   --   --   --   --   --   --   --  25.0* 19.3* 11.1* 6.6* 5.0*  PROCALCITON  --    < >  --   --   --   --  13.52  --  9.14 6.54 3.82 1.79 0.88  LATICACIDVEN  --   --   --  2.5* 3.5* 3.1*  --  1.8  --   --   --   --   --   INR 1.2  --   --   --   --   --   --   --   --   --   --   --   --  HGBA1C  --   --   --   --   --   --  6.0*  --   --   --   --   --   --   BNP  --   --  684.8*  --   --   --   --   --   --   --   --   --   --   MG  --    < >  --   --   --   --  1.5*  --  1.8 1.8 1.6* 1.8 1.8  CALCIUM 7.7*  --   --   --   --   --  8.1*  --  8.2* 8.0* 8.2* 8.2* 8.2*   < > = values in this interval not displayed.      Recent Labs  Lab 10/25/22 1451 10/25/22 1451 10/25/22 1500 10/25/22 1515 10/25/22 1723 10/25/22 1941 10/26/22 0028 10/26/22 0544 10/27/22 0650 10/28/22 0334 10/29/22 0259 10/30/22 0649 10/31/22 0700  CRP  --   --   --   --   --   --   --   --  25.0* 19.3* 11.1* 6.6* 5.0*  PROCALCITON  --    < >  --   --   --   --  13.52  --  9.14 6.54 3.82 1.79 0.88  LATICACIDVEN  --   --   --  2.5* 3.5* 3.1*  --  1.8  --   --   --   --   --   INR 1.2  --   --   --   --   --   --   --   --   --   --   --   --   HGBA1C  --   --   --   --   --   --  6.0*  --   --   --   --   --   --   BNP  --   --  684.8*  --   --   --   --   --   --   --    --   --   --   MG  --    < >  --   --   --   --  1.5*  --  1.8 1.8 1.6* 1.8 1.8  CALCIUM 7.7*  --   --   --   --   --  8.1*  --  8.2* 8.0* 8.2* 8.2* 8.2*   < > = values in this interval not displayed.    --------------------------------------------------------------------------------------------------------------- Lab Results  Component Value Date   CHOL 143 10/27/2022   HDL 42 10/27/2022   LDLCALC 82 10/27/2022   TRIG 95 10/27/2022   CHOLHDL 3.4 10/27/2022    Lab Results  Component Value Date   HGBA1C 6.0 (H) 10/26/2022     Radiology Reports DG Chest Port 1 View  Result Date: 10/31/2022 CLINICAL DATA:  Status post PICC line placement. EXAM: PORTABLE CHEST 1 VIEW COMPARISON:  10/27/2022 FINDINGS: Right PICC line has been placed. Catheter tip is at the superior cavoatrial junction and appears to be appropriately positioned. Again noted is enlargement of the cardiac silhouette. Slightly improved aeration at the left lung base. Residual densities in the retrocardiac region. Slightly improved aeration at the right lung base. Negative for a pneumothorax. IMPRESSION: 1. Right arm  PICC line is appropriately positioned. 2. Slightly improved aeration at the lung bases. Electronically Signed   By: Richarda Overlie M.D.   On: 10/31/2022 09:49   [place] EKG SITE RITE  Result Date: 10/30/2022 If Site Rite image not attached, placement could not be confirmed due to current cardiac rhythm.  EP STUDY  Result Date: 10/30/2022 See surgical note for result.     Signature  -   Huey Bienenstock M.D on 10/31/2022 at 1:20 PM   -  To page go to [email or web address]

## 2022-10-31 NOTE — Plan of Care (Signed)
  Problem: Education: Goal: Ability to describe self-care measures that may prevent or decrease complications (Diabetes Survival Skills Education) will improve Outcome: Progressing Goal: Individualized Educational Video(s) Outcome: Progressing   

## 2022-10-31 NOTE — Progress Notes (Signed)
Peripherally Inserted Central Catheter Placement  The IV Nurse has discussed with the patient and/or persons authorized to consent for the patient, the purpose of this procedure and the potential benefits and risks involved with this procedure.  The benefits include less needle sticks, lab draws from the catheter, and the patient may be discharged home with the catheter. Risks include, but not limited to, infection, bleeding, blood clot (thrombus formation), and puncture of an artery; nerve damage and irregular heartbeat and possibility to perform a PICC exchange if needed/ordered by physician.  Alternatives to this procedure were also discussed.  Bard Power PICC patient education guide, fact sheet on infection prevention and patient information card has been provided to patient /or left at bedside.    PICC Placement Documentation  PICC Single Lumen 10/31/22 Right Brachial 42 cm 0 cm (Active)  Indication for Insertion or Continuance of Line Home intravenous therapies (PICC only) 10/31/22 0845  Exposed Catheter (cm) 0 cm 10/31/22 0845  Site Assessment Clean, Dry, Intact 10/31/22 0845  Line Status Flushed;Saline locked;Blood return noted 10/31/22 0845  Dressing Type Transparent;Securing device 10/31/22 0845  Dressing Status Antimicrobial disc in place;Clean, Dry, Intact 10/31/22 0845  Line Adjustment (NICU/IV Team Only) No 10/31/22 0845  Dressing Intervention New dressing;Adhesive placed at insertion site (IV team only);Other (Comment) 10/31/22 0845  Dressing Change Due 11/07/22 10/31/22 0845       Reginia Forts Albarece 10/31/2022, 9:00 AM

## 2022-10-31 NOTE — Plan of Care (Signed)

## 2022-10-31 NOTE — Care Management Important Message (Signed)
Important Message  Patient Details  Name: Mason Hill MRN: [id number] Date of Birth: 07-Aug-1940   Medicare Important Message Given:  Yes     [name] 10/31/2022, 3:31 PM

## 2022-10-31 NOTE — Progress Notes (Signed)
IV nurse at bedside to insert PICC line at this [facility]

## 2022-10-31 NOTE — Plan of Care (Signed)
Problem: Education: Goal: Ability to describe self-care measures that may prevent or decrease complications (Diabetes Survival Skills Education) will improve Outcome: Progressing Goal: Individualized Educational Video(s) Outcome: Progressing   Problem: Coping: Goal: Ability to adjust to condition or change in health will improve Outcome: Progressing   Problem: Fluid Volume: Goal: Ability to maintain a balanced intake and output will improve Outcome: Progressing   Problem: Health Behavior/Discharge Planning: Goal: Ability to identify and utilize available resources and services will improve Outcome: Progressing Goal: Ability to manage health-related needs will improve Outcome: Progressing   Problem: Metabolic: Goal: Ability to maintain appropriate glucose levels will improve Outcome: Progressing   Problem: Nutritional: Goal: Maintenance of adequate nutrition will improve Outcome: Progressing Goal: Progress toward achieving an optimal weight will improve Outcome: Progressing   Problem: Skin Integrity: Goal: Risk for impaired skin integrity will decrease Outcome: Progressing   Problem: Tissue Perfusion: Goal: Adequacy of tissue perfusion will improve Outcome: Progressing   Problem: Education: Goal: Knowledge of General Education information will improve Description: Including pain rating scale, medication(s)/side effects and non-pharmacologic comfort measures Outcome: Progressing   Problem: Health Behavior/Discharge Planning: Goal: Ability to manage health-related needs will improve Outcome: Progressing   Problem: Clinical Measurements: Goal: Ability to maintain clinical measurements within normal limits will improve Outcome: Progressing Goal: Will remain free from infection Outcome: Progressing Goal: Diagnostic test results will improve Outcome: Progressing Goal: Respiratory complications will improve Outcome: Progressing Goal: Cardiovascular complication will  be avoided Outcome: Progressing   Problem: Activity: Goal: Risk for activity intolerance will decrease Outcome: Progressing   Problem: Nutrition: Goal: Adequate nutrition will be maintained Outcome: Progressing   Problem: Coping: Goal: Level of anxiety will decrease Outcome: Progressing   Problem: Elimination: Goal: Will not experience complications related to bowel motility Outcome: Progressing Goal: Will not experience complications related to urinary retention Outcome: Progressing   Problem: Pain Managment: Goal: General experience of comfort will improve Outcome: Progressing   Problem: Safety: Goal: Ability to remain free from injury will improve Outcome: Progressing   Problem: Skin Integrity: Goal: Risk for impaired skin integrity will decrease Outcome: Progressing   Problem: Education: Goal: Ability to describe self-care measures that may prevent or decrease complications (Diabetes Survival Skills Education) will improve Outcome: Progressing   Problem: Education: Goal: Individualized Educational Video(s) Outcome: Progressing   Problem: Coping: Goal: Ability to adjust to condition or change in health will improve Outcome: Progressing   Problem: Fluid Volume: Goal: Ability to maintain a balanced intake and output will improve Outcome: Progressing   Problem: Health Behavior/Discharge Planning: Goal: Ability to identify and utilize available resources and services will improve Outcome: Progressing   Problem: Health Behavior/Discharge Planning: Goal: Ability to manage health-related needs will improve Outcome: Progressing   Problem: Metabolic: Goal: Ability to maintain appropriate glucose levels will improve Outcome: Progressing   Problem: Nutritional: Goal: Maintenance of adequate nutrition will improve Outcome: Progressing   Problem: Nutritional: Goal: Progress toward achieving an optimal weight will improve Outcome: Progressing   Problem: Skin  Integrity: Goal: Risk for impaired skin integrity will decrease Outcome: Progressing   Problem: Tissue Perfusion: Goal: Adequacy of tissue perfusion will improve Outcome: Progressing   Problem: Education: Goal: Knowledge of General Education information will improve Description: Including pain rating scale, medication(s)/side effects and non-pharmacologic comfort measures Outcome: Progressing   Problem: Health Behavior/Discharge Planning: Goal: Ability to manage health-related needs will improve Outcome: Progressing   Problem: Clinical Measurements: Goal: Ability to maintain clinical measurements within normal limits will improve Outcome: Progressing  Problem: Clinical Measurements: Goal: Will remain free from infection Outcome: Progressing   Problem: Clinical Measurements: Goal: Will remain free from infection Outcome: Progressing   Problem: Clinical Measurements: Goal: Diagnostic test results will improve Outcome: Progressing   Problem: Clinical Measurements: Goal: Diagnostic test results will improve Outcome: Progressing   Problem: Clinical Measurements: Goal: Respiratory complications will improve Outcome: Progressing   Problem: Clinical Measurements: Goal: Cardiovascular complication will be avoided Outcome: Progressing   Problem: Activity: Goal: Risk for activity intolerance will decrease Outcome: Progressing   Problem: Nutrition: Goal: Adequate nutrition will be maintained Outcome: Progressing   Problem: Nutrition: Goal: Adequate nutrition will be maintained Outcome: Progressing   Problem: Coping: Goal: Level of anxiety will decrease Outcome: Progressing   Problem: Elimination: Goal: Will not experience complications related to bowel motility Outcome: Progressing   Problem: Elimination: Goal: Will not experience complications related to urinary retention Outcome: Progressing   Problem: Pain Managment: Goal: General experience of comfort  will improve Outcome: Progressing   Problem: Safety: Goal: Ability to remain free from injury will improve Outcome: Progressing   Problem: Skin Integrity: Goal: Risk for impaired skin integrity will decrease Outcome: Progressing

## 2022-10-31 NOTE — Progress Notes (Addendum)
Patient Name: Mustafa Belshaw Date of Encounter: 10/31/2022 [facility] Cardiologist: None   Interval Summary  .    Feeling well overall.  No complaints no chest pain or shortness of breath.  Vital Signs .    Vitals:   10/30/22 2043 10/31/22 0015 10/31/22 0400 10/31/22 0900  BP: (!) 101/57 101/65 111/63 113/72  Pulse: 71 70 70 70  Resp:  (!) 21 20   Temp:  98 F (36.7 C) 98.1 F (36.7 C) 97.7 F (36.5 C)  TempSrc:  Oral Oral Oral  SpO2: 93% 96% 94% 94%  Weight:      Height:        Intake/Output Summary (Last 24 hours) at 10/31/2022 0916 Last data filed at 10/31/2022 0600 Gross per 24 hour  Intake 2581.26 ml  Output 1100 ml  Net 1481.26 ml      10/30/2022    7:45 AM 10/29/2022    5:00 AM 10/28/2022    3:00 AM  Last 3 Weights  Weight (lbs) 221 lb 224 lb 10.4 oz 228 lb 13.4 oz  Weight (kg) 100.245 kg 101.9 kg 103.8 kg      Telemetry/ECG    Low voltage, normal sinus rhythm heart rates in the 70s.  PVCs.- Personally Reviewed  CV Studies     Physical Exam .   GEN: No acute distress.   Neck: severe JVD Cardiac: RRR, no murmurs, rubs, or gallops.  Respiratory: Diffuse crackles GI: Soft, nontender, non-distended  MS: 2+ pitting edema worse in the left leg Patient Profile    Nation Kackley is a 82 y.o. male has hx of  paroxysmal atrial fibrillation, paroxysmal junctional rhythm (declined PPM 2020), CAD , chronic diastolic heart failure, NSVT, hypertension, dyslipidemia, PAD required intervention, type 2 diabetes  and admitted on 10/25/2022 for the evaluation of Chest pain with elevated troponins, pericardial effusion.  Assessment & Plan .     Moderate pericardial effusion without tamponade physiology Anasarca Noted on CTA as well as echocardiogram.  Overall stable with no indications of pericardiocentesis.  Still has significant volume noted on PE however no complaints.  Unsure of I's and O's are documented accurately.  He had 2.6 L intake but only 1.1 L diuresis.  Likely needs more diuretic dose.  Volume multifactorial due to hypoalbuminemia, poor protein intake.   Currently on IV Lasix 80 mg twice daily. Will increase spirio to 50mg  since diuresis has slowed and likely to have a liver component. May eventually need to increase lasix if output is not where it needs to be.  Need strict I/O's Weight on admission 231.  Currently 221.  Endocarditis PNA Underwent TEE yesterday that showed small vegetation on MV.  Plan for high-dose penicillin G for 6 weeks starting on 10/26/2022.  Cultures have been negative.  Not likely to be a surgical candidate.  Severe coronary artery calcifications and aortic atherosclerosis noted on CT Presented with elevated trops with some chest pain in the setting of atrial fibrillation.  Likely demand ischemia.  Echocardiogram does not show any significant findings.  Likely can continue to evaluate outpatient if still indicated.  Paroxysmal atrial fibrillation Maintaining normal sinus rhythm currently.  Continue Eliquis and Lopressor 25 mg twice daily.  Chronic HFpEF PTA on losartan, spironolactone, Jardiance. Currently being held. Continue diuresis as above. May be able to continue jardiance to help augement diuresis. Will discuss with MD.    For questions or updates, please contact [facility] Please consult [email or web address] for contact info under  Signed, Abagail Kitchens, PA-C    Personally seen and examined. Agree with above.  Group B strep endocarditis, mitral valve TEE small vegetation infectious disease on board, 82 year old, moderate pericardial effusion, anasarca, PAF, mildly elevated troponin  Increase spironolactone from 25 mg up to 50 mg.  Has anasarca.  Low protein.  Questionable liver component, but this most likely malnutrition. Continuing with IV Lasix 80 mg twice a day.  Good diuresis so far. Still has 2+ left greater than right lower extremity edema.  Maintaining sinus rhythm, paroxysmal  atrial fibrillation.  Eliquis  Donato Schultz, MD

## 2022-10-31 NOTE — Progress Notes (Signed)
PHARMACY CONSULT NOTE FOR:  OUTPATIENT  PARENTERAL ANTIBIOTIC THERAPY (OPAT)  Indication: Group B Strep MV Endocarditis Regimen: IV Penicillin G 24 million units daily as a continuous infusion End date: 12/07/2022  IV antibiotic discharge orders are pended. To discharging provider:  please sign these orders via discharge navigator,  Select New Orders & click on the button choice - Manage This Unsigned Work.    Thank you for allowing pharmacy to be a part of this patient's care.  Lora Paula, PharmD PGY-2 Infectious Diseases Pharmacy Resident 10/31/2022 10:05 AM

## 2022-11-01 DIAGNOSIS — R6520 Severe sepsis without septic shock: Secondary | ICD-10-CM | POA: Diagnosis not present

## 2022-11-01 DIAGNOSIS — J189 Pneumonia, unspecified organism: Secondary | ICD-10-CM | POA: Diagnosis not present

## 2022-11-01 DIAGNOSIS — A419 Sepsis, unspecified organism: Secondary | ICD-10-CM | POA: Diagnosis not present

## 2022-11-01 DIAGNOSIS — R601 Generalized edema: Secondary | ICD-10-CM | POA: Diagnosis not present

## 2022-11-01 LAB — CBC
HCT: 32.8 % — ABNORMAL LOW (ref 39.0–52.0)
Hemoglobin: 10.5 g/dL — ABNORMAL LOW (ref 13.0–17.0)
MCH: 30.7 pg (ref 26.0–34.0)
MCHC: 32 g/dL (ref 30.0–36.0)
MCV: 95.9 fL (ref 80.0–100.0)
Platelets: 249 10*3/uL (ref 150–400)
RBC: 3.42 MIL/uL — ABNORMAL LOW (ref 4.22–5.81)
RDW: 13.3 % (ref 11.5–15.5)
WBC: 11.7 10*3/uL — ABNORMAL HIGH (ref 4.0–10.5)
nRBC: 0 % (ref 0.0–0.2)

## 2022-11-01 LAB — CULTURE, BLOOD (ROUTINE X 2)
Culture: NO GROWTH
Culture: NO GROWTH
Special Requests: ADEQUATE

## 2022-11-01 LAB — COMPREHENSIVE METABOLIC PANEL
ALT: 22 U/L (ref 0–44)
AST: 29 U/L (ref 15–41)
Albumin: 1.6 g/dL — ABNORMAL LOW (ref 3.5–5.0)
Alkaline Phosphatase: 103 U/L (ref 38–126)
Anion gap: 7 (ref 5–15)
BUN: 18 mg/dL (ref 8–23)
CO2: 32 mmol/L (ref 22–32)
Calcium: 8 mg/dL — ABNORMAL LOW (ref 8.9–10.3)
Chloride: 99 mmol/L (ref 98–111)
Creatinine, Ser: 1.01 mg/dL (ref 0.61–1.24)
GFR, Estimated: >60 mL/min (ref >60)
Glucose, Bld: 120 mg/dL — ABNORMAL HIGH (ref 70–99)
Potassium: 3.8 mmol/L (ref 3.5–5.1)
Sodium: 138 mmol/L (ref 135–145)
Total Bilirubin: 0.5 mg/dL (ref 0.3–1.2)
Total Protein: 4.7 g/dL — ABNORMAL LOW (ref 6.5–8.1)

## 2022-11-01 LAB — PROCALCITONIN: Procalcitonin: 0.57 ng/mL

## 2022-11-01 LAB — GLUCOSE, CAPILLARY
Glucose-Capillary: 102 mg/dL — ABNORMAL HIGH (ref 70–99)
Glucose-Capillary: 184 mg/dL — ABNORMAL HIGH (ref 70–99)
Glucose-Capillary: 202 mg/dL — ABNORMAL HIGH (ref 70–99)
Glucose-Capillary: 200 mg/dL — ABNORMAL HIGH (ref 70–99)

## 2022-11-01 MED ORDER — ALBUMIN HUMAN 25 % IV SOLN
25.0000 g | Freq: Four times a day (QID) | INTRAVENOUS | Status: AC
  Administered 2022-11-01 (×4): 25 g via INTRAVENOUS
  Filled 2022-11-01 (×4): qty 100

## 2022-11-01 NOTE — Discharge Instructions (Signed)

## 2022-11-01 NOTE — Plan of Care (Signed)
Problem: Education: Goal: Ability to describe self-care measures that may prevent or decrease complications (Diabetes Survival Skills Education) will improve Outcome: Progressing Goal: Individualized Educational Video(s) Outcome: Progressing   Problem: Coping: Goal: Ability to adjust to condition or change in health will improve Outcome: Progressing   Problem: Fluid Volume: Goal: Ability to maintain a balanced intake and output will improve Outcome: Progressing   Problem: Health Behavior/Discharge Planning: Goal: Ability to identify and utilize available resources and services will improve Outcome: Progressing Goal: Ability to manage health-related needs will improve Outcome: Progressing   Problem: Metabolic: Goal: Ability to maintain appropriate glucose levels will improve Outcome: Progressing   Problem: Nutritional: Goal: Maintenance of adequate nutrition will improve Outcome: Progressing Goal: Progress toward achieving an optimal weight will improve Outcome: Progressing   Problem: Skin Integrity: Goal: Risk for impaired skin integrity will decrease Outcome: Progressing   Problem: Tissue Perfusion: Goal: Adequacy of tissue perfusion will improve Outcome: Progressing   Problem: Education: Goal: Knowledge of General Education information will improve Description: Including pain rating scale, medication(s)/side effects and non-pharmacologic comfort measures Outcome: Progressing   Problem: Health Behavior/Discharge Planning: Goal: Ability to manage health-related needs will improve Outcome: Progressing   Problem: Clinical Measurements: Goal: Ability to maintain clinical measurements within normal limits will improve Outcome: Progressing Goal: Will remain free from infection Outcome: Progressing Goal: Diagnostic test results will improve Outcome: Progressing Goal: Respiratory complications will improve Outcome: Progressing Goal: Cardiovascular complication will  be avoided Outcome: Progressing   Problem: Activity: Goal: Risk for activity intolerance will decrease Outcome: Progressing   Problem: Nutrition: Goal: Adequate nutrition will be maintained Outcome: Progressing   Problem: Coping: Goal: Level of anxiety will decrease Outcome: Progressing   Problem: Elimination: Goal: Will not experience complications related to bowel motility Outcome: Progressing Goal: Will not experience complications related to urinary retention Outcome: Progressing   Problem: Pain Managment: Goal: General experience of comfort will improve Outcome: Progressing   Problem: Safety: Goal: Ability to remain free from injury will improve Outcome: Progressing   Problem: Skin Integrity: Goal: Risk for impaired skin integrity will decrease Outcome: Progressing   Problem: Education: Goal: Ability to describe self-care measures that may prevent or decrease complications (Diabetes Survival Skills Education) will improve Outcome: Progressing   Problem: Education: Goal: Individualized Educational Video(s) Outcome: Progressing   Problem: Coping: Goal: Ability to adjust to condition or change in health will improve Outcome: Progressing   Problem: Fluid Volume: Goal: Ability to maintain a balanced intake and output will improve Outcome: Progressing   Problem: Health Behavior/Discharge Planning: Goal: Ability to identify and utilize available resources and services will improve Outcome: Progressing   Problem: Health Behavior/Discharge Planning: Goal: Ability to manage health-related needs will improve Outcome: Progressing   Problem: Metabolic: Goal: Ability to maintain appropriate glucose levels will improve Outcome: Progressing   Problem: Nutritional: Goal: Maintenance of adequate nutrition will improve Outcome: Progressing   Problem: Nutritional: Goal: Progress toward achieving an optimal weight will improve Outcome: Progressing   Problem: Skin  Integrity: Goal: Risk for impaired skin integrity will decrease Outcome: Progressing   Problem: Tissue Perfusion: Goal: Adequacy of tissue perfusion will improve Outcome: Progressing   Problem: Education: Goal: Knowledge of General Education information will improve Description: Including pain rating scale, medication(s)/side effects and non-pharmacologic comfort measures Outcome: Progressing   Problem: Health Behavior/Discharge Planning: Goal: Ability to manage health-related needs will improve Outcome: Progressing   Problem: Clinical Measurements: Goal: Ability to maintain clinical measurements within normal limits will improve Outcome: Progressing  Problem: Clinical Measurements: Goal: Will remain free from infection Outcome: Progressing   Problem: Clinical Measurements: Goal: Diagnostic test results will improve Outcome: Progressing   Problem: Clinical Measurements: Goal: Respiratory complications will improve Outcome: Progressing   Problem: Clinical Measurements: Goal: Cardiovascular complication will be avoided Outcome: Progressing   Problem: Activity: Goal: Risk for activity intolerance will decrease Outcome: Progressing   Problem: Nutrition: Goal: Adequate nutrition will be maintained Outcome: Progressing   Problem: Coping: Goal: Level of anxiety will decrease Outcome: Progressing   Problem: Safety: Goal: Ability to remain free from injury will improve Outcome: Progressing   Problem: Pain Managment: Goal: General experience of comfort will improve Outcome: Progressing   Problem: Elimination: Goal: Will not experience complications related to urinary retention Outcome: Progressing

## 2022-11-01 NOTE — Progress Notes (Signed)
PROGRESS NOTE                                                                                                                                                                                                             Patient Demographics:    Mason Hill, is a 82 y.o. male, DOB - 12-21-1940, ZOX:[id number]  Outpatient Primary MD for the patient is Shapely-Quinn, Desiree Lucy, MD    LOS - 7  Admit date - 10/25/2022    Chief Complaint  Patient presents with   Chest Pain       Brief Narrative (HPI from H&P)     82 year old male with history of hypertension, CKD stage III, hyperlipidemia, diabetes type 2, chronic diastolic CHF, paroxysmal A-fib, coronary artery disease, hypertension, hyperlipidemia, peripheral artery disease require intervention who initially presented with midepigastric pain, chest pain, abdominal pain, dry cough, progressive worsening of bilateral lower extremity swelling, dyspnea, orthopnea.  On presentation, blood pressure was soft, had mild fever but saturating fine on room air.  Respiratory viral panel came out of negative.  Lab work showed lactic acid of 2.5, potassium of 3.3, creatinine of 1.6, WBC count of 16.4, elevated BNP of 664, elevated troponin.  EKG showed A-fib.  Chest x-ray showed enlarged cardiac shadow, mid lung base opacity left greater than right.  CT abdomen/pelvis showed cardiomegaly, moderate pericardial effusion.  Patient was admitted for the management of possible sepsis secondary to pneumonia, +++ edema,  Cardiology ,ID also consulted.    Subjective:   No significant events overnight, he denies any complaints today, reports his edema is improving.   Assessment  & Plan :    Community-acquired pneumonia, strep agalactiae bacteremia /endocarditis   :Chest x-ray showed possible bilateral pneumonia left greater than right.  Blood culture positive for strep agalactiae, appreciate ID input,  TTE shows no vegetation, but TEE significant for vegetation, ID input greatly appreciated, recommendation for 6 weeks of IV antibiotics, PICC line inserted 9//2024    Enlarged periaortic/bilateral inguinal lymph nodes:  - Likely reactive.  Recommend follow-up CT abdomen/pelvis in 3 months, cardiology on board.   Anasarca due to combination of hypoalbuminemia, moderate protein calorie malnutrition and dCHF:  -Has low albumin.  Likely from moderate protein calorie malnutrition, UA has no proteinuria, LFTs are stable, on protein supplementation and diuresis -Continue with IV diuresis, remains 80  mg IV twice daily, continue with Aldactone as well, volume status significantly improved, - fluid balance is negative 3.3 over last 24 hours, creatinine and blood pressure stable -Will give IV albumin    Pericardial effusion:  -Noted on echocardiogram, moderate, cardiology on board.  No tamponade physiology, continue with IV diuresis   Coronary artery disease/elevated troponin:  - Elevated with flat trend.  Complaint of chest pain on admission which has resolved.  Currently chest pain-free.  EKG showed A-fib, PVCs, minimal ST depression in the inferior leads.  Elevated troponin could be from demand ischemia.  Echocardiogram ordered.  Takes Lipitor at home.  Cardiology following   Paroxysmal A-fib:  -On Eliquis for anticoagulation, and low-dose beta-blocker, cardiology following.  Monitor on telemetry.  Currently in normal sinus rhythm   Acute on chronic diastolic CHF exacerbation EF 60%: Takes amlodipine, losartan, Jardiance at home.  Presented with anasarca, elevated BNP.  Chest x-ray showing cardiomegaly.  Continue diuretics, cardiology on board.   Hypertension: Currently antihypertensives on hold.  On losartan, spironolactone, Jardiance, Lasix at home.   Hypokalemia/hypomagnesemia: Currently being monitored and supplemented as needed.   Debility/deconditioning: Patient lives with his grandson.  Walks  fine at home.  Continue PT.  Diabetes type 2: A1c of 6.  Jardiance at home.  Currently on sliding scale   Lab Results  Component Value Date   HGBA1C 6.0 (H) 10/26/2022   CBG (last 3)  Recent Labs    10/31/22 2112 11/01/22 0733 11/01/22 1142  GLUCAP 150* 102* 184*          Condition -  Guarded  Family Communication  : Discussed with sister at bedside  Code Status :  Full  Consults  :  Cards, ID  PUD Prophylaxis :     Procedures  :     TEE due on 10/30/22  CT chest abdomen and pelvis- 1. Cardiomegaly and moderate pericardial effusion. 2. Small left-greater-than-right pleural effusions and atelectasis. 3. Evaluation of the abdomen and pelvis is markedly limited due to streak artifact and diffuse anasarca. 4. Diffuse mesenteric stranding and trace abdominopelvic ascites, possibly related to volume status. 5. Bladder wall thickening, findings can be seen in the setting of cystitis. Correlate with urinalysis. 6. Enlarged periaortic and bilateral inguinal lymph nodes, possibly reactive. Consider follow-up CT of the abdomen and pelvis in 3 months to ensure resolution. 7. Severe coronary artery calcifications and aortic Atherosclerosis (ICD10-I70.0).    TTE -  1. Left ventricular ejection fraction, by estimation, is 60 to 65%. Left ventricular ejection fraction by PLAX is 62 %. The left ventricle has normal function. The left ventricle has no regional wall motion abnormalities. Left ventricular diastolic parameters are indeterminate. There is the interventricular septum is flattened in systole, consistent with right ventricular pressure overload.  2. Right ventricular systolic function is severely reduced. The right ventricular size is moderately enlarged. There is mildly elevated pulmonary artery systolic pressure. The estimated right ventricular systolic pressure is 42.7 mmHg.  3. Right atrial size was severely dilated.  4. Pericardial fluid measures 1.36 cm over the RV apex and up to  2.29 cm around the right atrium.. Moderate pericardial effusion. The pericardial effusion is anterior to the right ventricle.  5. The mitral valve is degenerative. Trivial mitral valve regurgitation.  6. The aortic valve is tricuspid. Aortic valve regurgitation is not visualized.  7. Aortic dilatation noted. There is borderline dilatation of the aortic root, measuring 39 mm.  8. The inferior vena cava is dilated in size  with <50% respiratory variability, suggesting right atrial pressure of 15 mmHg.      Disposition Plan  :    Status is: Inpatient   DVT Prophylaxis  :    Place TED hose Start: 10/27/22 0939 SCDs Start: 10/25/22 1934 apixaban (ELIQUIS) tablet 5 mg     Lab Results  Component Value Date   PLT 249 11/01/2022    Diet :  Diet Order             Diet Heart Room service appropriate? Yes; Fluid consistency: Thin; Fluid restriction: 1500 mL Fluid  Diet effective now                    Inpatient Medications  Scheduled Meds:  (feeding supplement) PROSource Plus  30 mL Oral BID BM   apixaban  5 mg Oral BID   aspirin EC  81 mg Oral Daily   atorvastatin  40 mg Oral Daily   Chlorhexidine Gluconate Cloth  6 each Topical Daily   feeding supplement  237 mL Oral BID BM   furosemide  80 mg Intravenous BID   insulin aspart  0-5 Units Subcutaneous QHS   insulin aspart  0-6 Units Subcutaneous TID WC   lactose free nutrition  237 mL Oral BID BM   metoprolol tartrate  25 mg Oral BID   multivitamin with minerals  1 tablet Oral Daily   sodium chloride flush  3 mL Intravenous Q12H   spironolactone  50 mg Oral Daily   Continuous Infusions:  sodium chloride     albumin human 25 g (11/01/22 1153)   penicillin G potassium 12 Million Units in dextrose 5 % 500 mL CONTINUOUS infusion 41.7 mL/hr at 11/01/22 0836   PRN Meds:.sodium chloride, acetaminophen **OR** acetaminophen, melatonin, ondansetron **OR** ondansetron (ZOFRAN) IV, sodium chloride flush, sodium chloride flush     Objective:   Vitals:   10/31/22 2159 11/01/22 0433 11/01/22 0735 11/01/22 1125  BP: 104/67 (!) 107/58 112/62 102/69  Pulse: 68 70 72 67  Resp:   18 20  Temp: 98 F (36.7 C) 98 F (36.7 C) 98 F (36.7 C) 98.2 F (36.8 C)  TempSrc: Oral Oral Oral Oral  SpO2:  94% 93% 98%  Weight:  94.5 kg    Height:        Wt Readings from Last 3 Encounters:  11/01/22 94.5 kg  07/17/19 91.2 kg     Intake/Output Summary (Last 24 hours) at 11/01/2022 1200 Last data filed at 11/01/2022 1156 Gross per 24 hour  Intake 1349.22 ml  Output 3425 ml  Net -2075.78 ml     Physical Exam  Awake Alert, Oriented X 3, No new F.N deficits, Normal affect Symmetrical Chest wall movement, Good air movement bilaterally, CTAB RRR,No Gallops,Rubs or new Murmurs, No Parasternal Heave +ve B.Sounds, Abd Soft, No tenderness, No rebound - guarding or rigidity. No Cyanosis, wearing Unna boots bilaterally, edema much improved     Data Review:    Recent Labs  Lab 10/25/22 1451 10/26/22 0028 10/28/22 0334 10/29/22 0259 10/30/22 0649 10/31/22 0700 11/01/22 0403  WBC 16.4*   < > 12.8* 8.3 7.1 10.1 11.7*  HGB 11.2*   < > 10.4* 10.9* 11.0* 11.5* 10.5*  HCT 35.0*   < > 32.0* 34.4* 33.8* 35.2* 32.8*  PLT 174   < > 133* 157 179 239 249  MCV 94.9   < > 96.4 94.8 92.9 92.1 95.9  MCH 30.4   < > 31.3 30.0 30.2  30.1 30.7  MCHC 32.0   < > 32.5 31.7 32.5 32.7 32.0  RDW 13.7   < > 14.0 13.5 13.4 13.4 13.3  LYMPHSABS 0.3*  --   --   --   --   --   --   MONOABS 1.6*  --   --   --   --   --   --   EOSABS 0.0  --   --   --   --   --   --   BASOSABS 0.1  --   --   --   --   --   --    < > = values in this interval not displayed.    Recent Labs  Lab 10/25/22 1451 10/25/22 1451 10/25/22 1500 10/25/22 1515 10/25/22 1723 10/25/22 1941 10/26/22 0028 10/26/22 0544 10/27/22 0650 10/28/22 0334 10/29/22 0259 10/30/22 0649 10/31/22 0700 11/01/22 0403  NA 141  --   --   --   --   --  140  --  139 139 137 139 135 138   K 3.3*  --   --   --   --   --  4.1  --  3.7 3.9 4.1 3.9 4.2 3.8  CL 106  --   --   --   --   --  101  --  100 101 99 99 97* 99  CO2 27  --   --   --   --   --  26  --  28 30 28 31 29  32  ANIONGAP 8  --   --   --   --   --  13  --  11 8 10 9 9 7   GLUCOSE 105*  --   --   --   --   --  119*  --  127* 115* 144* 132* 183* 120*  BUN 18  --   --   --   --   --  21  --  24* 26* 25* 18 19 18   CREATININE 1.16  --   --   --   --   --  1.15  --  1.07 1.14 1.07 0.95 0.91 1.01  AST 25  --   --   --   --   --  25  --  21 26 32 29 34 29  ALT 13  --   --   --   --   --  13  --  14 13 16 18 25 22   ALKPHOS 46  --   --   --   --   --  46  --  68 83 99 100 116 103  BILITOT 1.4*  --   --   --   --   --  1.0  --  0.7 0.6 0.5 0.5 0.7 0.5  ALBUMIN 1.9*  --   --   --   --   --  2.3*  --  1.8* 1.6* 1.7* 1.6* 1.7* 1.6*  CRP  --   --   --   --   --   --   --   --  25.0* 19.3* 11.1* 6.6* 5.0*  --   PROCALCITON  --    < >  --   --   --   --  13.52  --  9.14 6.54 3.82 1.79 0.88 0.57  LATICACIDVEN  --   --   --  2.5* 3.5* 3.1*  --  1.8  --   --   --   --   --   --   INR 1.2  --   --   --   --   --   --   --   --   --   --   --   --   --   HGBA1C  --   --   --   --   --   --  6.0*  --   --   --   --   --   --   --   BNP  --   --  684.8*  --   --   --   --   --   --   --   --   --   --   --   MG  --    < >  --   --   --   --  1.5*  --  1.8 1.8 1.6* 1.8 1.8  --   CALCIUM 7.7*  --   --   --   --   --  8.1*  --  8.2* 8.0* 8.2* 8.2* 8.2* 8.0*   < > = values in this interval not displayed.      Recent Labs  Lab 10/25/22 1451 10/25/22 1451 10/25/22 1500 10/25/22 1515 10/25/22 1723 10/25/22 1941 10/26/22 0028 10/26/22 0544 10/27/22 0650 10/28/22 0334 10/29/22 0259 10/30/22 0649 10/31/22 0700 11/01/22 0403  CRP  --   --   --   --   --   --   --   --  25.0* 19.3* 11.1* 6.6* 5.0*  --   PROCALCITON  --    < >  --   --   --   --  13.52  --  9.14 6.54 3.82 1.79 0.88 0.57  LATICACIDVEN  --   --   --  2.5* 3.5* 3.1*  --   1.8  --   --   --   --   --   --   INR 1.2  --   --   --   --   --   --   --   --   --   --   --   --   --   HGBA1C  --   --   --   --   --   --  6.0*  --   --   --   --   --   --   --   BNP  --   --  684.8*  --   --   --   --   --   --   --   --   --   --   --   MG  --    < >  --   --   --   --  1.5*  --  1.8 1.8 1.6* 1.8 1.8  --   CALCIUM 7.7*  --   --   --   --   --  8.1*  --  8.2* 8.0* 8.2* 8.2* 8.2* 8.0*   < > = values in this interval not displayed.    --------------------------------------------------------------------------------------------------------------- Lab Results  Component Value Date   CHOL 143 10/27/2022   HDL 42 10/27/2022   LDLCALC 82 10/27/2022   TRIG 95 10/27/2022   CHOLHDL 3.4 10/27/2022    Lab Results  Component Value Date  HGBA1C 6.0 (H) 10/26/2022     Radiology Reports DG Chest Port 1 View  Result Date: 10/31/2022 CLINICAL DATA:  Status post PICC line placement. EXAM: PORTABLE CHEST 1 VIEW COMPARISON:  10/27/2022 FINDINGS: Right PICC line has been placed. Catheter tip is at the superior cavoatrial junction and appears to be appropriately positioned. Again noted is enlargement of the cardiac silhouette. Slightly improved aeration at the left lung base. Residual densities in the retrocardiac region. Slightly improved aeration at the right lung base. Negative for a pneumothorax. IMPRESSION: 1. Right arm PICC line is appropriately positioned. 2. Slightly improved aeration at the lung bases. Electronically Signed   By: Richarda Overlie M.D.   On: 10/31/2022 09:49   [place] EKG SITE RITE  Result Date: 10/30/2022 If Site Rite image not attached, placement could not be confirmed due to current cardiac rhythm.  EP STUDY  Result Date: 10/30/2022 See surgical note for result.     Signature  -   Huey Bienenstock M.D on 11/01/2022 at 12:00 PM   -  To page go to [email or web address]

## 2022-11-01 NOTE — Progress Notes (Addendum)
Patient Name: Mason Hill Date of Encounter: 11/01/2022 [facility] Cardiologist: None   Interval Summary  .    Feeling well overall.  No complaints no chest pain or shortness of breath.  He had very good output yesterday and feels like his swelling has improved greatly.  Vital Signs .    Vitals:   10/31/22 0900 10/31/22 2159 11/01/22 0433 11/01/22 0735  BP: 113/72 104/67 (!) 107/58 112/62  Pulse: 70 68 70 72  Resp:    18  Temp: 97.7 F (36.5 C) 98 F (36.7 C) 98 F (36.7 C) 98 F (36.7 C)  TempSrc: Oral Oral Oral Oral  SpO2: 94%  94% 93%  Weight:   94.5 kg   Height:        Intake/Output Summary (Last 24 hours) at 11/01/2022 0742 Last data filed at 11/01/2022 0735 Gross per 24 hour  Intake 480 ml  Output 3675 ml  Net -3195 ml      11/01/2022    4:33 AM 10/30/2022    7:45 AM 10/29/2022    5:00 AM  Last 3 Weights  Weight (lbs) 208 lb 5.4 oz 221 lb 224 lb 10.4 oz  Weight (kg) 94.5 kg 100.245 kg 101.9 kg      Telemetry/ECG    Low voltage, normal sinus rhythm heart rates in the 70s.  PVCs.- Personally Reviewed  CV Studies     Physical Exam .   GEN: No acute distress.   Neck: severe JVD Cardiac: RRR, no murmurs, rubs, or gallops.  Respiratory: Diminished lung sounds in the bases.  Still with some crackles however much improved. GI: Soft, nontender, non-distended  MS: 1-+ pitting edema worse in the left leg Patient Profile    Pranith Veeder is a 82 y.o. male has hx of  paroxysmal atrial fibrillation, paroxysmal junctional rhythm (declined PPM 2020), CAD , chronic diastolic heart failure, NSVT, hypertension, dyslipidemia, PAD required intervention, type 2 diabetes  and admitted on 10/25/2022 for the evaluation of Chest pain with elevated troponins, pericardial effusion.  Assessment & Plan .    Moderate pericardial effusion without tamponade physiology Anasarca Noted on CTA as well as echocardiogram.  Overall stable with no indications of pericardiocentesis.   Volume multifactorial due to hypoalbuminemia, poor protein intake.    Yesterday we increased his spironolactone to 50 mg with very good diuresis.  He put out 3.3 L yesterday with significant improvement in swelling.  Thankfully he has little to no symptoms.  Continue same course for today.  Renal function stable and within normal limits.  Also had significant weight loss if accurate documented below. Currently on IV Lasix 80 mg twice daily and spironolactone 50 mg  Weight on admission 231.  Currently 221-208.  Endocarditis PNA Underwent TEE that showed small vegetation on MV.  Plan for high-dose penicillin G for 6 weeks starting on 10/26/2022.  Cultures have been negative.  Not likely to be a surgical candidate.  Severe coronary artery calcifications and aortic atherosclerosis noted on CT Presented with elevated trops with some chest pain in the setting of atrial fibrillation.  Likely demand ischemia.  Echocardiogram does not show any significant findings.  Likely can continue to evaluate outpatient if still indicated.  Paroxysmal atrial fibrillation Maintaining normal sinus rhythm currently.  Continue Eliquis and Lopressor 25 mg twice daily.  Chronic HFpEF PTA on losartan, spironolactone, Jardiance. Currently being held. Continue diuresis as above.   For questions or updates, please contact [facility] Please consult [email or web address] for  contact info under      Signed, Abagail Kitchens, PA-C    Personally seen and examined. Agree with above.  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  Continue with protein supplementation dietary modification.  Albumin is currently Heang IV.  Spironolactone increased to 50 mg.  Much improved diuresis yesterday.  Continue.  Blood pressure stable creatinine 1.01 with potassium of 3.8.  Pleasant, comfortable in bed.  Endocarditis being treated with high-dose penicillin G for 6 weeks.  Does not need  valvular surgery.  Maintaining sinus rhythm with paroxysmal atrial fibrillation continue with Eliquis.  Donato Schultz, MD

## 2022-11-02 ENCOUNTER — Ambulatory Visit (HOSPITAL_COMMUNITY): Admission: RE | Admit: 2022-11-02 | Payer: Medicare Other | Source: Home / Self Care | Admitting: Cardiovascular Disease

## 2022-11-02 ENCOUNTER — Encounter (HOSPITAL_COMMUNITY): Admission: RE | Payer: Self-pay | Source: Home / Self Care

## 2022-11-02 DIAGNOSIS — R6520 Severe sepsis without septic shock: Secondary | ICD-10-CM | POA: Diagnosis not present

## 2022-11-02 DIAGNOSIS — A419 Sepsis, unspecified organism: Secondary | ICD-10-CM | POA: Diagnosis not present

## 2022-11-02 DIAGNOSIS — J189 Pneumonia, unspecified organism: Secondary | ICD-10-CM | POA: Diagnosis not present

## 2022-11-02 DIAGNOSIS — I059 Rheumatic mitral valve disease, unspecified: Secondary | ICD-10-CM | POA: Diagnosis not present

## 2022-11-02 LAB — COMPREHENSIVE METABOLIC PANEL
ALT: 24 U/L (ref 0–44)
AST: 35 U/L (ref 15–41)
Albumin: 2.5 g/dL — ABNORMAL LOW (ref 3.5–5.0)
Alkaline Phosphatase: 98 U/L (ref 38–126)
Anion gap: 8 (ref 5–15)
BUN: 26 mg/dL — ABNORMAL HIGH (ref 8–23)
CO2: 33 mmol/L — ABNORMAL HIGH (ref 22–32)
Calcium: 8.3 mg/dL — ABNORMAL LOW (ref 8.9–10.3)
Chloride: 97 mmol/L — ABNORMAL LOW (ref 98–111)
Creatinine, Ser: 1.06 mg/dL (ref 0.61–1.24)
GFR, Estimated: >60 mL/min (ref >60)
Glucose, Bld: 143 mg/dL — ABNORMAL HIGH (ref 70–99)
Potassium: 3.9 mmol/L (ref 3.5–5.1)
Sodium: 138 mmol/L (ref 135–145)
Total Bilirubin: 0.5 mg/dL (ref 0.3–1.2)
Total Protein: 5.2 g/dL — ABNORMAL LOW (ref 6.5–8.1)

## 2022-11-02 LAB — CBC
HCT: 29.8 % — ABNORMAL LOW (ref 39.0–52.0)
Hemoglobin: 9.5 g/dL — ABNORMAL LOW (ref 13.0–17.0)
MCH: 29.9 pg (ref 26.0–34.0)
MCHC: 31.9 g/dL (ref 30.0–36.0)
MCV: 93.7 fL (ref 80.0–100.0)
Platelets: 265 10*3/uL (ref 150–400)
RBC: 3.18 MIL/uL — ABNORMAL LOW (ref 4.22–5.81)
RDW: 13.4 % (ref 11.5–15.5)
WBC: 9.8 10*3/uL (ref 4.0–10.5)
nRBC: 0 % (ref 0.0–0.2)

## 2022-11-02 LAB — GLUCOSE, CAPILLARY
Glucose-Capillary: 127 mg/dL — ABNORMAL HIGH (ref 70–99)
Glucose-Capillary: 181 mg/dL — ABNORMAL HIGH (ref 70–99)
Glucose-Capillary: 159 mg/dL — ABNORMAL HIGH (ref 70–99)
Glucose-Capillary: 143 mg/dL — ABNORMAL HIGH (ref 70–99)

## 2022-11-02 SURGERY — TRANSESOPHAGEAL ECHOCARDIOGRAM (TEE)
Anesthesia: Monitor Anesthesia Care

## 2022-11-02 MED ORDER — PNEUMOCOCCAL 20-VAL CONJ VACC 0.5 ML IM SUSY
0.5000 mL | PREFILLED_SYRINGE | INTRAMUSCULAR | Status: AC
  Administered 2022-11-03: 0.5 mL via INTRAMUSCULAR
  Filled 2022-11-02: qty 0.5

## 2022-11-02 MED ORDER — INFLUENZA VAC A&B SURF ANT ADJ 0.5 ML IM SUSY
0.5000 mL | PREFILLED_SYRINGE | INTRAMUSCULAR | Status: AC
  Administered 2022-11-03: 0.5 mL via INTRAMUSCULAR
  Filled 2022-11-02: qty 0.5

## 2022-11-02 NOTE — Progress Notes (Signed)
PROGRESS NOTE                                                                                                                                                                                                             Patient Demographics:    Mason Hill, is a 82 y.o. male, DOB - [birth year], XBJ:[id number]  Outpatient Primary MD for the patient is Shapely-Quinn, Desiree Lucy, MD    LOS - 8  Admit date - 10/25/2022    Chief Complaint  Patient presents with   Chest Pain       Brief Narrative (HPI from H&P)     82 year old male with history of hypertension, CKD stage III, hyperlipidemia, diabetes type 2, chronic diastolic CHF, paroxysmal A-fib, coronary artery disease, hypertension, hyperlipidemia, peripheral artery disease require intervention who initially presented with midepigastric pain, chest pain, abdominal pain, dry cough, progressive worsening of bilateral lower extremity swelling, dyspnea, orthopnea.  On presentation, blood pressure was soft, had mild fever but saturating fine on room air.  Respiratory viral panel came out of negative.  Lab work showed lactic acid of 2.5, potassium of 3.3, creatinine of 1.6, WBC count of 16.4, elevated BNP of 664, elevated troponin.  EKG showed A-fib.  Chest x-ray showed enlarged cardiac shadow, mid lung base opacity left greater than right.  CT abdomen/pelvis showed cardiomegaly, moderate pericardial effusion.  Patient was admitted for the management of possible sepsis secondary to pneumonia, +++ edema,  Cardiology ,ID also consulted.    Subjective:   He denies any complaints today, no significant events overnight as discussed with staff   Assessment  & Plan :    Community-acquired pneumonia, strep agalactiae bacteremia /endocarditis   :Chest x-ray showed possible bilateral pneumonia left greater than right.  Blood culture positive for strep agalactiae, appreciate ID input, TTE shows  no vegetation, but TEE significant for vegetation, ID input greatly appreciated, recommendation for 6 weeks of IV antibiotics, PICC line inserted 9//2024    Enlarged periaortic/bilateral inguinal lymph nodes:  - Likely reactive.  Recommend follow-up CT abdomen/pelvis in 3 months,   Anasarca due to combination of hypoalbuminemia, moderate protein calorie malnutrition and dCHF:  -Has low albumin.  Likely from moderate protein calorie malnutrition, UA has no proteinuria, LFTs are stable, on protein supplementation and diuresis -Continue with IV Lasix at current dose 80 mg IV  twice daily, he is diuresing well, he is -3 L over last 24 hours continue with IV diuresis, remains 80 mg IV twice daily, continue with Aldactone as well, volume status significantly improved, - fluid balance is negative 1.6 L over last 24 hours, he has -10.5 L since admission.  Creatinine and blood pressure stable -Received IV albumin   Pericardial effusion:  -Noted on echocardiogram, moderate, cardiology on board.  No tamponade physiology, continue with IV diuresis   Coronary artery disease/elevated troponin:  - Elevated with flat trend.  Complaint of chest pain on admission which has resolved.  Currently chest pain-free.  EKG showed A-fib, PVCs, minimal ST depression in the inferior leads.  Elevated troponin could be from demand ischemia.  Echocardiogram ordered.  Takes Lipitor at home.  Cardiology following   Paroxysmal A-fib:  -On Eliquis for anticoagulation, and low-dose beta-blocker, cardiology following.  Monitor on telemetry.  Currently in normal sinus rhythm   Acute on chronic diastolic CHF exacerbation EF 60%: Takes amlodipine, losartan, Jardiance at home.  Presented with anasarca, elevated BNP.  Chest x-ray showing cardiomegaly.  Continue diuretics, cardiology on board.   Hypertension: Currently antihypertensives on hold.  On losartan, spironolactone, Jardiance, Lasix at home.   Hypokalemia/hypomagnesemia:  Currently being monitored and supplemented as needed.   Debility/deconditioning: Patient lives with his grandson.  Walks fine at home.  Continue PT.  Diabetes type 2: A1c of 6.  Jardiance at home.  Currently on sliding scale   Lab Results  Component Value Date   HGBA1C 6.0 (H) 10/26/2022   CBG (last 3)  Recent Labs    11/01/22 2152 11/02/22 0747 11/02/22 1317  GLUCAP 200* 127* 181*          Condition -  Guarded  Family Communication  : Discussed with sister at bedside  Code Status :  Full  Consults  :  Cards, ID  PUD Prophylaxis :     Procedures  :     TEE due on 10/30/22  CT chest abdomen and pelvis- 1. Cardiomegaly and moderate pericardial effusion. 2. Small left-greater-than-right pleural effusions and atelectasis. 3. Evaluation of the abdomen and pelvis is markedly limited due to streak artifact and diffuse anasarca. 4. Diffuse mesenteric stranding and trace abdominopelvic ascites, possibly related to volume status. 5. Bladder wall thickening, findings can be seen in the setting of cystitis. Correlate with urinalysis. 6. Enlarged periaortic and bilateral inguinal lymph nodes, possibly reactive. Consider follow-up CT of the abdomen and pelvis in 3 months to ensure resolution. 7. Severe coronary artery calcifications and aortic Atherosclerosis (ICD10-I70.0).    TTE -  1. Left ventricular ejection fraction, by estimation, is 60 to 65%. Left ventricular ejection fraction by PLAX is 62 %. The left ventricle has normal function. The left ventricle has no regional wall motion abnormalities. Left ventricular diastolic parameters are indeterminate. There is the interventricular septum is flattened in systole, consistent with right ventricular pressure overload.  2. Right ventricular systolic function is severely reduced. The right ventricular size is moderately enlarged. There is mildly elevated pulmonary artery systolic pressure. The estimated right ventricular systolic pressure is  42.7 mmHg.  3. Right atrial size was severely dilated.  4. Pericardial fluid measures 1.36 cm over the RV apex and up to 2.29 cm around the right atrium.. Moderate pericardial effusion. The pericardial effusion is anterior to the right ventricle.  5. The mitral valve is degenerative. Trivial mitral valve regurgitation.  6. The aortic valve is tricuspid. Aortic valve regurgitation is not visualized.  7. Aortic dilatation noted. There is borderline dilatation of the aortic root, measuring 39 mm.  8. The inferior vena cava is dilated in size with <50% respiratory variability, suggesting right atrial pressure of 15 mmHg.      Disposition Plan  :    Status is: Inpatient   DVT Prophylaxis  :    Place TED hose Start: 10/27/22 0939 SCDs Start: 10/25/22 1934 apixaban (ELIQUIS) tablet 5 mg     Lab Results  Component Value Date   PLT 265 11/02/2022    Diet :  Diet Order             Diet Heart Room service appropriate? Yes; Fluid consistency: Thin; Fluid restriction: 1500 mL Fluid  Diet effective now                    Inpatient Medications  Scheduled Meds:  (feeding supplement) PROSource Plus  30 mL Oral BID BM   apixaban  5 mg Oral BID   aspirin EC  81 mg Oral Daily   atorvastatin  40 mg Oral Daily   Chlorhexidine Gluconate Cloth  6 each Topical Daily   feeding supplement  237 mL Oral BID BM   furosemide  80 mg Intravenous BID   insulin aspart  0-5 Units Subcutaneous QHS   insulin aspart  0-6 Units Subcutaneous TID WC   lactose free nutrition  237 mL Oral BID BM   metoprolol tartrate  25 mg Oral BID   multivitamin with minerals  1 tablet Oral Daily   sodium chloride flush  3 mL Intravenous Q12H   spironolactone  50 mg Oral Daily   Continuous Infusions:  sodium chloride     penicillin G potassium 12 Million Units in dextrose 5 % 500 mL CONTINUOUS infusion 12 Million Units (11/02/22 1207)   PRN Meds:.sodium chloride, acetaminophen **OR** acetaminophen, ondansetron **OR**  ondansetron (ZOFRAN) IV, sodium chloride flush, sodium chloride flush    Objective:   Vitals:   11/02/22 0000 11/02/22 0718 11/02/22 0738 11/02/22 1200  BP: (!) 108/57 111/65  121/68  Pulse: 69 69  68  Resp: 18 17  18   Temp: 98.5 F (36.9 C) 98.3 F (36.8 C)  98 F (36.7 C)  TempSrc: Oral Oral  Oral  SpO2: 94% 95%  97%  Weight:   91.4 kg   Height:        Wt Readings from Last 3 Encounters:  11/02/22 91.4 kg  07/17/19 91.2 kg     Intake/Output Summary (Last 24 hours) at 11/02/2022 1404 Last data filed at 11/02/2022 1208 Gross per 24 hour  Intake 1539.94 ml  Output 3750 ml  Net -2210.06 ml     Physical Exam  Awake Alert, Oriented X 3, No new F.N deficits, Normal affect Symmetrical Chest wall movement, Good air movement bilaterally, CTAB RRR,No Gallops,Rubs or new Murmurs, No Parasternal Heave +ve B.Sounds, Abd Soft, No tenderness, No rebound - guarding or rigidity. No Cyanosis, Clubbing lower extremity edema much improved, wearing Unna boots.      Data Review:    Recent Labs  Lab 10/29/22 0259 10/30/22 0649 10/31/22 0700 11/01/22 0403 11/02/22 0344  WBC 8.3 7.1 10.1 11.7* 9.8  HGB 10.9* 11.0* 11.5* 10.5* 9.5*  HCT 34.4* 33.8* 35.2* 32.8* 29.8*  PLT 157 179 239 249 265  MCV 94.8 92.9 92.1 95.9 93.7  MCH 30.0 30.2 30.1 30.7 29.9  MCHC 31.7 32.5 32.7 32.0 31.9  RDW 13.5 13.4 13.4 13.3 13.4  Recent Labs  Lab 10/27/22 0650 10/28/22 0334 10/29/22 0259 10/30/22 0649 10/31/22 0700 11/01/22 0403 11/02/22 0344  NA 139 139 137 139 135 138 138  K 3.7 3.9 4.1 3.9 4.2 3.8 3.9  CL 100 101 99 99 97* 99 97*  CO2 28 30 28 31 29  32 33*  ANIONGAP 11 8 10 9 9 7 8   GLUCOSE 127* 115* 144* 132* 183* 120* 143*  BUN 24* 26* 25* 18 19 18  26*  CREATININE 1.07 1.14 1.07 0.95 0.91 1.01 1.06  AST 21 26 32 29 34 29 35  ALT 14 13 16 18 25 22 24   ALKPHOS 68 83 99 100 116 103 98  BILITOT 0.7 0.6 0.5 0.5 0.7 0.5 0.5  ALBUMIN 1.8* 1.6* 1.7* 1.6* 1.7* 1.6* 2.5*  CRP 25.0*  19.3* 11.1* 6.6* 5.0*  --   --   PROCALCITON 9.14 6.54 3.82 1.79 0.88 0.57  --   MG 1.8 1.8 1.6* 1.8 1.8  --   --   CALCIUM 8.2* 8.0* 8.2* 8.2* 8.2* 8.0* 8.3*      Recent Labs  Lab 10/27/22 0650 10/28/22 0334 10/29/22 0259 10/30/22 0649 10/31/22 0700 11/01/22 0403 11/02/22 0344  CRP 25.0* 19.3* 11.1* 6.6* 5.0*  --   --   PROCALCITON 9.14 6.54 3.82 1.79 0.88 0.57  --   MG 1.8 1.8 1.6* 1.8 1.8  --   --   CALCIUM 8.2* 8.0* 8.2* 8.2* 8.2* 8.0* 8.3*    --------------------------------------------------------------------------------------------------------------- Lab Results  Component Value Date   CHOL 143 10/27/2022   HDL 42 10/27/2022   LDLCALC 82 10/27/2022   TRIG 95 10/27/2022   CHOLHDL 3.4 10/27/2022    Lab Results  Component Value Date   HGBA1C 6.0 (H) 10/26/2022     Radiology Reports DG Chest Port 1 View  Result Date: 10/31/2022 CLINICAL DATA:  Status post PICC line placement. EXAM: PORTABLE CHEST 1 VIEW COMPARISON:  10/27/2022 FINDINGS: Right PICC line has been placed. Catheter tip is at the superior cavoatrial junction and appears to be appropriately positioned. Again noted is enlargement of the cardiac silhouette. Slightly improved aeration at the left lung base. Residual densities in the retrocardiac region. Slightly improved aeration at the right lung base. Negative for a pneumothorax. IMPRESSION: 1. Right arm PICC line is appropriately positioned. 2. Slightly improved aeration at the lung bases. Electronically Signed   By: Richarda Overlie M.D.   On: 10/31/2022 09:49   [place] EKG SITE RITE  Result Date: 10/30/2022 If Site Rite image not attached, placement could not be confirmed due to current cardiac rhythm.  EP STUDY  Result Date: 10/30/2022 See surgical note for result.     Signature  -   Huey Bienenstock M.D on 11/02/2022 at 2:04 PM   -  To page go to [email or web address]

## 2022-11-02 NOTE — TOC Progression Note (Signed)
Transition of Care [facility]) - Progression Note    Patient Details  Name: Mason Hill MRN: [id number] Date of Birth: 10/18/1940  [facility] CM/SW Contact  Gordy Clement, RN Phone Number: 11/02/2022, 3:23 PM  Clinical Narrative:     Patient will DC to home with IVABX that will be provided by [facility].  Frances Furbish will provide the home health RN. There is no DME being recommended as of today.  Family will transport at DC.   TOC will continue to follow patient for any additional discharge needs          Expected Discharge Plan and Services                                               Social Determinants of Health (SDOH) Interventions SDOH Screenings   Food Insecurity: No Food Insecurity (06/21/2022)   Received from [facility]  Transportation Needs: No Transportation Needs (06/21/2022)   Received from [facility]  Utilities: Not At Risk (06/20/2022)   [facility]  Financial Resource Strain: Low Risk  (06/21/2022)   Received from [facility]  Physical Activity: Insufficiently Active (07/24/2019)   Received from [facility]  Social Connections: Socially Integrated (07/24/2019)   Received from [facility]  Stress: No Stress Concern Present (07/24/2019)   Received from [facility]  Tobacco Use: Low Risk  (10/30/2022)    Readmission Risk Interventions    10/29/2022    2:39 PM  Readmission Risk Prevention Plan  Transportation Screening Complete  PCP or Specialist Appt within 5-7 Days Complete  Home Care Screening Complete  Medication Review (RN CM) Referral to Pharmacy

## 2022-11-02 NOTE — Progress Notes (Signed)
   Patient Name: Mason Hill Date of Encounter: 11/02/2022 [facility] Cardiologist: None   Interval Summary  .    Improving.  Swelling improved.  Good weight loss.  -3 L again yesterday.  Vital Signs .    Vitals:   11/01/22 2159 11/02/22 0000 11/02/22 0718 11/02/22 0738  BP: (!) 111/55 (!) 108/57 111/65   Pulse: 81 69 69   Resp:  18 17   Temp:  98.5 F (36.9 C) 98.3 F (36.8 C)   TempSrc:  Oral Oral   SpO2:  94% 95%   Weight:    91.4 kg  Height:        Intake/Output Summary (Last 24 hours) at 11/02/2022 1035 Last data filed at 11/02/2022 1024 Gross per 24 hour  Intake 1539.94 ml  Output 4300 ml  Net -2760.06 ml      11/02/2022    7:38 AM 11/01/2022    4:33 AM 10/30/2022    7:45 AM  Last 3 Weights  Weight (lbs) 201 lb 8 oz 208 lb 5.4 oz 221 lb  Weight (kg) 91.4 kg 94.5 kg 100.245 kg      Telemetry/ECG    Stable sinus rhythm- Personally Reviewed  Physical Exam .   GEN: No acute distress.   Neck: No JVD Cardiac: RRR, no murmurs, rubs, or gallops.  Respiratory: Clear to auscultation bilaterally. GI: Soft, nontender, non-distended  MS: 1-2+ lower extremity edema  Assessment & Plan .     82 year old with protein calorie malnutrition hypoalbuminemia anasarca moderate pericardial effusion endocarditis with small mitral valve vegetation noted on TEE.  -Increased his spironolactone 2 days ago to 50 mg.  Diuresing much better.  Continue.  Weight was 235 on admit, currently 201. -Continue with nutritional supplementation protein. -IV antibiotics per ID 6 weeks.  Penicillin G.  Started on 10/26/2022.  Cultures negative. -No evidence of atrial fibrillation maintaining sinus rhythm currently.  Continue with Eliquis. -Creatinine 1.06 potassium 3.9   For questions or updates, please contact [facility] Please consult [email or web address] for contact info under        Signed, Donato Schultz, MD

## 2022-11-02 NOTE — Plan of Care (Signed)
Problem: Education: Goal: Ability to describe self-care measures that may prevent or decrease complications (Diabetes Survival Skills Education) will improve Outcome: Progressing Goal: Individualized Educational Video(s) Outcome: Progressing   Problem: Coping: Goal: Ability to adjust to condition or change in health will improve Outcome: Progressing   Problem: Fluid Volume: Goal: Ability to maintain a balanced intake and output will improve Outcome: Progressing   Problem: Health Behavior/Discharge Planning: Goal: Ability to identify and utilize available resources and services will improve Outcome: Progressing Goal: Ability to manage health-related needs will improve Outcome: Progressing   Problem: Metabolic: Goal: Ability to maintain appropriate glucose levels will improve Outcome: Progressing   Problem: Nutritional: Goal: Maintenance of adequate nutrition will improve Outcome: Progressing Goal: Progress toward achieving an optimal weight will improve Outcome: Progressing   Problem: Skin Integrity: Goal: Risk for impaired skin integrity will decrease Outcome: Progressing   Problem: Tissue Perfusion: Goal: Adequacy of tissue perfusion will improve Outcome: Progressing   Problem: Education: Goal: Knowledge of General Education information will improve Description: Including pain rating scale, medication(s)/side effects and non-pharmacologic comfort measures Outcome: Progressing   Problem: Health Behavior/Discharge Planning: Goal: Ability to manage health-related needs will improve Outcome: Progressing   Problem: Clinical Measurements: Goal: Ability to maintain clinical measurements within normal limits will improve Outcome: Progressing Goal: Will remain free from infection Outcome: Progressing Goal: Diagnostic test results will improve Outcome: Progressing Goal: Respiratory complications will improve Outcome: Progressing Goal: Cardiovascular complication will  be avoided Outcome: Progressing   Problem: Activity: Goal: Risk for activity intolerance will decrease Outcome: Progressing   Problem: Nutrition: Goal: Adequate nutrition will be maintained Outcome: Progressing   Problem: Coping: Goal: Level of anxiety will decrease Outcome: Progressing   Problem: Elimination: Goal: Will not experience complications related to bowel motility Outcome: Progressing Goal: Will not experience complications related to urinary retention Outcome: Progressing   Problem: Pain Managment: Goal: General experience of comfort will improve Outcome: Progressing   Problem: Safety: Goal: Ability to remain free from injury will improve Outcome: Progressing   Problem: Skin Integrity: Goal: Risk for impaired skin integrity will decrease Outcome: Progressing   Problem: Education: Goal: Ability to describe self-care measures that may prevent or decrease complications (Diabetes Survival Skills Education) will improve Outcome: Progressing   Problem: Education: Goal: Individualized Educational Video(s) Outcome: Progressing   Problem: Coping: Goal: Ability to adjust to condition or change in health will improve Outcome: Progressing   Problem: Fluid Volume: Goal: Ability to maintain a balanced intake and output will improve Outcome: Progressing   Problem: Health Behavior/Discharge Planning: Goal: Ability to identify and utilize available resources and services will improve Outcome: Progressing   Problem: Health Behavior/Discharge Planning: Goal: Ability to manage health-related needs will improve Outcome: Progressing   Problem: Metabolic: Goal: Ability to maintain appropriate glucose levels will improve Outcome: Progressing   Problem: Nutritional: Goal: Maintenance of adequate nutrition will improve Outcome: Progressing   Problem: Nutritional: Goal: Maintenance of adequate nutrition will improve Outcome: Progressing   Problem:  Nutritional: Goal: Progress toward achieving an optimal weight will improve Outcome: Progressing   Problem: Skin Integrity: Goal: Risk for impaired skin integrity will decrease Outcome: Progressing   Problem: Tissue Perfusion: Goal: Adequacy of tissue perfusion will improve Outcome: Progressing   Problem: Education: Goal: Knowledge of General Education information will improve Description: Including pain rating scale, medication(s)/side effects and non-pharmacologic comfort measures Outcome: Progressing   Problem: Health Behavior/Discharge Planning: Goal: Ability to manage health-related needs will improve Outcome: Progressing   Problem: Clinical Measurements: Goal: Ability  to maintain clinical measurements within normal limits will improve Outcome: Progressing   Problem: Clinical Measurements: Goal: Will remain free from infection Outcome: Progressing   Problem: Clinical Measurements: Goal: Diagnostic test results will improve Outcome: Progressing   Problem: Clinical Measurements: Goal: Respiratory complications will improve Outcome: Progressing   Problem: Clinical Measurements: Goal: Cardiovascular complication will be avoided Outcome: Progressing   Problem: Activity: Goal: Risk for activity intolerance will decrease Outcome: Progressing   Problem: Nutrition: Goal: Adequate nutrition will be maintained Outcome: Progressing   Problem: Coping: Goal: Level of anxiety will decrease Outcome: Progressing   Problem: Elimination: Goal: Will not experience complications related to bowel motility Outcome: Progressing   Problem: Elimination: Goal: Will not experience complications related to urinary retention Outcome: Progressing   Problem: Elimination: Goal: Will not experience complications related to urinary retention Outcome: Progressing   Problem: Pain Managment: Goal: General experience of comfort will improve Outcome: Progressing   Problem: Pain  Managment: Goal: General experience of comfort will improve Outcome: Progressing   Problem: Safety: Goal: Ability to remain free from injury will improve Outcome: Progressing   Problem: Safety: Goal: Ability to remain free from injury will improve Outcome: Progressing   Problem: Skin Integrity: Goal: Risk for impaired skin integrity will decrease Outcome: Progressing   Problem: Skin Integrity: Goal: Risk for impaired skin integrity will decrease Outcome: Progressing

## 2022-11-03 ENCOUNTER — Other Ambulatory Visit (HOSPITAL_COMMUNITY): Payer: Self-pay

## 2022-11-03 DIAGNOSIS — I059 Rheumatic mitral valve disease, unspecified: Secondary | ICD-10-CM | POA: Diagnosis not present

## 2022-11-03 DIAGNOSIS — A419 Sepsis, unspecified organism: Secondary | ICD-10-CM | POA: Diagnosis not present

## 2022-11-03 DIAGNOSIS — R6520 Severe sepsis without septic shock: Secondary | ICD-10-CM | POA: Diagnosis not present

## 2022-11-03 LAB — COMPREHENSIVE METABOLIC PANEL
ALT: 24 U/L (ref 0–44)
AST: 31 U/L (ref 15–41)
Albumin: 2.3 g/dL — ABNORMAL LOW (ref 3.5–5.0)
Alkaline Phosphatase: 96 U/L (ref 38–126)
Anion gap: 8 (ref 5–15)
BUN: 27 mg/dL — ABNORMAL HIGH (ref 8–23)
CO2: 31 mmol/L (ref 22–32)
Calcium: 8 mg/dL — ABNORMAL LOW (ref 8.9–10.3)
Chloride: 94 mmol/L — ABNORMAL LOW (ref 98–111)
Creatinine, Ser: 0.97 mg/dL (ref 0.61–1.24)
GFR, Estimated: >60 mL/min (ref >60)
Glucose, Bld: 137 mg/dL — ABNORMAL HIGH (ref 70–99)
Potassium: 3.8 mmol/L (ref 3.5–5.1)
Sodium: 133 mmol/L — ABNORMAL LOW (ref 135–145)
Total Bilirubin: 0.4 mg/dL (ref 0.3–1.2)
Total Protein: 5.2 g/dL — ABNORMAL LOW (ref 6.5–8.1)

## 2022-11-03 LAB — CBC
HCT: 31.8 % — ABNORMAL LOW (ref 39.0–52.0)
Hemoglobin: 10 g/dL — ABNORMAL LOW (ref 13.0–17.0)
MCH: 29.4 pg (ref 26.0–34.0)
MCHC: 31.4 g/dL (ref 30.0–36.0)
MCV: 93.5 fL (ref 80.0–100.0)
Platelets: 307 10*3/uL (ref 150–400)
RBC: 3.4 MIL/uL — ABNORMAL LOW (ref 4.22–5.81)
RDW: 13.6 % (ref 11.5–15.5)
WBC: 9.9 10*3/uL (ref 4.0–10.5)
nRBC: 0 % (ref 0.0–0.2)

## 2022-11-03 LAB — GLUCOSE, CAPILLARY
Glucose-Capillary: 180 mg/dL — ABNORMAL HIGH (ref 70–99)
Glucose-Capillary: 193 mg/dL — ABNORMAL HIGH (ref 70–99)

## 2022-11-03 MED ORDER — FUROSEMIDE 40 MG PO TABS
40.0000 mg | ORAL_TABLET | Freq: Two times a day (BID) | ORAL | 0 refills | Status: DC
  Filled 2022-11-03: qty 60, 30d supply, fill #0

## 2022-11-03 MED ORDER — APIXABAN 5 MG PO TABS
5.0000 mg | ORAL_TABLET | Freq: Two times a day (BID) | ORAL | 0 refills | Status: AC
  Filled 2022-11-03: qty 60, 30d supply, fill #0

## 2022-11-03 MED ORDER — METOPROLOL TARTRATE 25 MG PO TABS
25.0000 mg | ORAL_TABLET | Freq: Two times a day (BID) | ORAL | 0 refills | Status: DC
  Filled 2022-11-03: qty 60, 30d supply, fill #0

## 2022-11-03 MED ORDER — HEPARIN SOD (PORK) LOCK FLUSH 100 UNIT/ML IV SOLN
250.0000 [IU] | INTRAVENOUS | Status: AC | PRN
  Administered 2022-11-03: 250 [IU]

## 2022-11-03 MED ORDER — SPIRONOLACTONE 50 MG PO TABS
25.0000 mg | ORAL_TABLET | Freq: Every day | ORAL | 0 refills | Status: DC
  Filled 2022-11-03: qty 30, 60d supply, fill #0

## 2022-11-03 MED ORDER — ATORVASTATIN CALCIUM 40 MG PO TABS
40.0000 mg | ORAL_TABLET | Freq: Every day | ORAL | 0 refills | Status: DC
  Filled 2022-11-03: qty 30, 30d supply, fill #0

## 2022-11-03 NOTE — Progress Notes (Signed)
   Patient Name: Mason Hill Date of Encounter: 11/03/2022 [facility] Cardiologist: None   Interval Summary  .    No cardiac complaints   Vital Signs .    Vitals:   11/03/22 0000 11/03/22 0300 11/03/22 0735 11/03/22 0755  BP: 107/60 107/60  117/64  Pulse: 69   70  Resp: 18 17  17   Temp: 98 F (36.7 C) 98.6 F (37 C)  98.9 F (37.2 C)  TempSrc: Oral Oral  Oral  SpO2: 95%   94%  Weight:   87.3 kg   Height:        Intake/Output Summary (Last 24 hours) at 11/03/2022 0818 Last data filed at 11/03/2022 0735 Gross per 24 hour  Intake 1212.62 ml  Output 6925 ml  Net -5712.38 ml      11/03/2022    7:35 AM 11/02/2022    7:38 AM 11/01/2022    4:33 AM  Last 3 Weights  Weight (lbs) 192 lb 7.4 oz 201 lb 8 oz 208 lb 5.4 oz  Weight (kg) 87.3 kg 91.4 kg 94.5 kg      Telemetry/ECG    Low voltage, normal sinus rhythm heart rates in the 70s.  PVCs.- Personally Reviewed  CV Studies     Physical Exam .    Elderly black male PIC line RUE JVP elevated with V wave No murmur Abdomen benign Edema improved    Mason Hill is a 82 y.o. male has hx of  paroxysmal atrial fibrillation, paroxysmal junctional rhythm (declined PPM 2020), CAD , chronic diastolic heart failure, NSVT, hypertension, dyslipidemia, PAD required intervention, type 2 diabetes  and admitted on 10/25/2022 for the evaluation of Chest pain with elevated troponins, pericardial effusion.  Assessment & Plan .    Moderate pericardial effusion without tamponade physiology Anasarca Noted on CTA as well as echocardiogram.  Overall stable with no indications of pericardiocentesis.  Volume multifactorial due to hypoalbuminemia, poor protein intake.    Currently on IV Lasix 80 mg twice daily and spironolactone 50 mg  Weight on admission 231.  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  Plan for high-dose penicillin G for 6 weeks starting on 10/26/2022.  Cultures have been  negative.  Not likely to be a surgical candidate. Only trivial residual MR noted on TEE  Severe coronary artery calcifications and aortic atherosclerosis noted on CT Presented with elevated trops with some chest pain in the setting of atrial fibrillation.  Likely demand ischemia.  Echocardiogram does not show any significant findings.  Likely can continue to evaluate outpatient if still indicated.  Paroxysmal atrial fibrillation Maintaining normal sinus rhythm currently.  Continue Eliquis and Lopressor 25 mg twice daily.  Chronic HFpEF PTA on losartan, spironolactone, Jardiance. Currently being held. Continue diuresis as above.   For questions or updates, please contact [facility] Please consult [email or web address] for contact info under      Signed, Charlton Haws, MD

## 2022-11-03 NOTE — Discharge Summary (Signed)
Physician Discharge Summary  Mason Hill BMW:[id number] DOB: 1940-05-10 DOA: 10/25/2022  PCP: Nonda Lou, MD  Admit date: 10/25/2022 Discharge date: 11/03/2022  Admitted From: (Home) Disposition:  (Home )  Recommendations for Outpatient Follow-up:  Follow up with PCP in 1-2 weeks Please obtain BMP/CBC in one week Will need repeat CT abdomen pelvis in 3 months to follow on enlarged periaortic and inguinal lymph nodes   OPAT Orders Discharge antibiotics to be given via PICC line Discharge antibiotics: Pen G  Per pharmacy protocol  Duration: 6 weeks  End Date: 12/07/22   [facility] Care Per Protocol:   Home health RN for IV administration and teaching; PICC line care and labs.     Labs weekly while on IV antibiotics: X__ CBC with differential X__ BMP __ CMP X__ CRP X__ ESR __ Vancomycin trough __ CK   X__ Please pull PIC at completion of IV antibiotics __ Please leave PIC in place until doctor has seen patient or been notified   Fax weekly labs to [phone]   Clinic Follow Up Appt: 10/8 at 2 pm     Home Health: (YES, for Home IV ABX)  Diet recommendation: Heart Healthy   Brief/Interim Summary:  82 year old male with history of hypertension, CKD stage III, hyperlipidemia, diabetes type 2, chronic diastolic CHF, paroxysmal A-fib, coronary artery disease, hypertension, hyperlipidemia, peripheral artery disease require intervention who initially presented with midepigastric pain, chest pain, abdominal pain, dry cough, progressive worsening of bilateral lower extremity swelling, dyspnea, orthopnea. Chest x-ray showed enlarged cardiac shadow, mid lung base opacity left greater than right. CT abdomen/pelvis showed cardiomegaly, moderate pericardial effusion. Patient was admitted for the management of possible sepsis secondary to pneumonia, +++ edema, Cardiology ,ID also consulted.  His workup significant for Streptococcus bacteremia, TEE significant for endocarditis,  he is in massive volume overload for which she received aggressive diuresis, volume status much improved, please see discussion below.   Community-acquired pneumonia, strep agalactiae bacteremia /endocarditis   Chest x-ray showed possible bilateral pneumonia left greater than right.  Blood culture positive for strep agalactiae, appreciate ID input, TTE shows no vegetation, but TEE significant for vegetation, ID input greatly appreciated, recommendation for 6 weeks of IV antibiotics, PICC line inserted 9//2024, patient to continue with penicillin G with end date 12/07/2022   Enlarged periaortic/bilateral inguinal lymph nodes:  - Likely reactive.  Recommend follow-up CT abdomen/pelvis in 3 months,   Anasarca due to combination of hypoalbuminemia, moderate protein calorie malnutrition and acute on chronic diastolic CHF  -Has low albumin.  Likely from moderate protein calorie malnutrition, UA has no proteinuria, LFTs are stable, on protein supplementation and diuresis, patient with acute of IV diuresis during hospital stay, kept on Lasix 80 mg IV twice daily, volume status much improved, edema much improved as well, he is -15 L during hospital stay, he will be discharged on Lasix 40 mg p.o. twice daily, Aldactone 25 mg oral daily -Received IV albumin   Pericardial effusion:  -Noted on echocardiogram, moderate, cardiology on board.  No tamponade physiology, continue with  diuresis   Coronary artery disease/elevated troponin:  -Scented with elevated troponins, with some chest pain, likely demand ischemia, echo does not show any significant findings, further workup as an outpatient, aspirin has been discontinued as he started on Eliquis.   Paroxysmal A-fib:  -On Eliquis for anticoagulation, and low-dose beta-blocker, cardiology following.  Monitor on telemetry.  Currently in normal sinus rhythm   Acute on chronic diastolic CHF exacerbation EF 60%: Takes amlodipine,  losartan, Jardiance at home.   Presented with anasarca, elevated BNP.  Chest x-ray showing cardiomegaly.  Continue diuretics, cardiology on board.  Budipine and losartan has been discontinued and he is started on Lasix and beta-blockers.   Hypertension: Medication has been adjusted, he is currently on beta-blockers and Lasix   Hypokalemia/hypomagnesemia: Repleted   Debility/deconditioning: Patient lives with his grandson.  Walks fine at home.     Diabetes type 2: A1c of 6.  Jardiance at home.   Discharge Diagnoses:  Principal Problem:   Severe sepsis (HCC) Active Problems:   CAP (community acquired pneumonia)   Pericardial effusion   Elevated troponin   Essential hypertension   Chronic kidney disease (CKD), stage III (moderate) (HCC)   Paroxysmal atrial fibrillation (HCC)   DM (diabetes mellitus) type II controlled with renal manifestation (HCC)   Chronic systolic CHF (congestive heart failure) (HCC)   History of coronary artery disease - small OM occlusion; med Rx   Hypokalemia   Malnutrition of moderate degree   Endocarditis of mitral valve    Discharge Instructions  Discharge Instructions     [facility] pharmacist to adjust dose for Vancomycin, Aminoglycosides and other anti-infective therapies as requested by physician.   Complete by: As directed    [facility] to provide Cath Flo 2mg    Complete by: As directed    Administer for PICC line occlusion and as ordered by physician for other access device issues.   Anaphylaxis Kit: Provided to treat any anaphylactic reaction to the medication being provided to the patient if First Dose or when requested by physician   Complete by: As directed    Epinephrine 1mg /ml vial / amp: Administer 0.3mg  (0.55ml) subcutaneously once for moderate to severe anaphylaxis, nurse to call physician and pharmacy when reaction occurs and call 911 if needed for immediate care   Diphenhydramine 50mg /ml IV vial: Administer 25-50mg  IV/IM PRN for first dose  reaction, rash, itching, mild reaction, nurse to call physician and pharmacy when reaction occurs   Sodium Chloride 0.9% NS IV: Administer if needed for hypovolemic blood pressure drop or as ordered by physician after call to physician with anaphylactic reaction   Change dressing on IV access line weekly and PRN   Complete by: As directed    Diet - low sodium heart healthy   Complete by: As directed    Discharge instructions   Complete by: As directed    Follow with Primary MD Shapely-Quinn, Desiree Lucy, MD in 7 days   Get CBC, CMP, 2 view Chest X ray checked  by Primary MD next visit.    Activity: As tolerated with Full fall precautions use walker/cane & assistance as needed   Disposition Home    Diet: Heart Healthy  , with feeding assistance and aspiration precautions.  For Heart failure patients - Check your Weight same time everyday, if you gain over 2 pounds, or you develop in leg swelling, experience more shortness of breath or chest pain, call your Primary MD immediately. Follow Cardiac Low Salt Diet and 1.5 lit/day fluid restriction.   On your next visit with your primary care physician please Get Medicines reviewed and adjusted.   Please request your Prim.MD to go over all Hospital Tests and Procedure/Radiological results at the follow up, please get all Hospital records sent to your Prim MD by signing hospital release before you go home.   If you experience worsening of your admission symptoms, develop shortness of breath, life threatening emergency, suicidal  or homicidal thoughts you must seek medical attention immediately by calling 911 or calling your MD immediately  if symptoms less severe.  You Must read complete instructions/literature along with all the possible adverse reactions/side effects for all the Medicines you take and that have been prescribed to you. Take any new Medicines after you have completely understood and accpet all the possible adverse  reactions/side effects.   Do not drive, operating heavy machinery, perform activities at heights, swimming or participation in water activities or provide baby sitting services if your were admitted for syncope or siezures until you have seen by Primary MD or a Neurologist and advised to do so again.  Do not drive when taking Pain medications.    Do not take more than prescribed Pain, Sleep and Anxiety Medications  Special Instructions: If you have smoked or chewed Tobacco  in the last 2 yrs please stop smoking, stop any regular Alcohol  and or any Recreational drug use.  Wear Seat belts while driving.   Please note  You were cared for by a hospitalist during your hospital stay. If you have any questions about your discharge medications or the care you received while you were in the hospital after you are discharged, you can call the unit and asked to speak with the hospitalist on call if the hospitalist that took care of you is not available. Once you are discharged, your primary care physician will handle any further medical issues. Please note that NO REFILLS for any discharge medications will be authorized once you are discharged, as it is imperative that you return to your primary care physician (or establish a relationship with a primary care physician if you do not have one) for your aftercare needs so that they can reassess your need for medications and monitor your lab values.   Flush IV access with Sodium Chloride 0.9% and Heparin 10 units/ml or 100 units/ml   Complete by: As directed    Home infusion instructions - [facility]   Complete by: As directed    Instructions: Flush IV access with Sodium Chloride 0.9% and Heparin 10units/ml or 100units/ml   Change dressing on IV access line: Weekly and PRN   Instructions Cath Flo 2mg : Administer for PICC Line occlusion and as ordered by physician for other access device   [facility] pharmacist to adjust dose for:  Vancomycin, Aminoglycosides and other anti-infective therapies as requested by physician   Increase activity slowly   Complete by: As directed    Method of administration may be changed at the discretion of home infusion pharmacist based upon assessment of the patient and/or caregiver's ability to self-administer the medication ordered   Complete by: As directed    No wound care   Complete by: As directed       Allergies as of 11/03/2022       Reactions   Codeine Other (See Comments), Rash   Other Reaction: BLISTERING, PEELING.   Erythromycin    Isosorbide Nitrate    Other reaction(s): Headache        Medication List     STOP taking these medications    amLODipine 10 MG tablet Commonly known as: NORVASC   chlorthalidone 25 MG tablet Commonly known as: HYGROTON   losartan 25 MG tablet Commonly known as: COZAAR       TAKE these medications    apixaban 5 MG Tabs tablet Commonly known as: ELIQUIS Take 1 tablet (5 mg total) by mouth 2 (two)  times daily.   atorvastatin 40 MG tablet Commonly known as: LIPITOR Take 1 tablet (40 mg total) by mouth daily. Start taking on: November 04, 2022   furosemide 40 MG tablet Commonly known as: Lasix Take 1 tablet (40 mg total) by mouth 2 (two) times daily.   metoprolol tartrate 25 MG tablet Commonly known as: LOPRESSOR Take 1 tablet (25 mg total) by mouth 2 (two) times daily.   penicillin G IVPB Inject 24 Million Units into the vein daily. As Continuous Infusion Indication:  Group B Strep MV Endocarditis First Dose: Yes Last Day of Therapy:  12/07/2022 Labs - Once weekly:  CBC/D and BMP, Labs - Once weekly: ESR and CRP Method of administration: Elastomeric (Continuous infusion) Method of administration may be changed at the discretion of home infusion pharmacist based upon assessment of the patient and/or caregiver's ability to self-administer the medication ordered.   spironolactone 50 MG tablet Commonly known as:  ALDACTONE Take 0.5 tablets (25 mg total) by mouth daily. Start taking on: November 04, 2022               Discharge Care Instructions  (From admission, onward)           Start     Ordered   10/31/22 0000  Change dressing on IV access line weekly and PRN  (Home infusion instructions - [facility] )        10/31/22 1133            Follow-up Information     [facility], [facility] Follow up.   Specialty: Home Health Services Why: for home health nurse Contact information: [address] [place] [address] [phone]                Allergies  Allergen Reactions   Codeine Other (See Comments) and Rash    Other Reaction: BLISTERING, PEELING.   Erythromycin    Isosorbide Nitrate     Other reaction(s): Headache    Consultations: Cardiology ID   Procedures/Studies: DG Chest Port 1 View  Result Date: 10/31/2022 CLINICAL DATA:  Status post PICC line placement. EXAM: PORTABLE CHEST 1 VIEW COMPARISON:  10/27/2022 FINDINGS: Right PICC line has been placed. Catheter tip is at the superior cavoatrial junction and appears to be appropriately positioned. Again noted is enlargement of the cardiac silhouette. Slightly improved aeration at the left lung base. Residual densities in the retrocardiac region. Slightly improved aeration at the right lung base. Negative for a pneumothorax. IMPRESSION: 1. Right arm PICC line is appropriately positioned. 2. Slightly improved aeration at the lung bases. Electronically Signed   By: Richarda Overlie M.D.   On: 10/31/2022 09:49   [place] EKG SITE RITE  Result Date: 10/30/2022 If Site Rite image not attached, placement could not be confirmed due to current cardiac rhythm.  EP STUDY  Result Date: 10/30/2022 See surgical note for result.  DG Chest Port 1 View  Result Date: 10/27/2022 CLINICAL DATA:  81 year old male shortness of breath. EXAM: PORTABLE CHEST 1 VIEW COMPARISON:  CT Chest, Abdomen, and Pelvis  10/25/2022 and earlier. FINDINGS: Portable AP semi upright view at 0637 hours. Cardiomegaly. Other mediastinal contours are within normal limits. Visualized tracheal air column is within normal limits. Retrocardiac opacity is stable, with left pleural effusion and atelectasis recently demonstrated on CT. No pneumothorax or pulmonary edema. Stable right lung ventilation. No acute osseous abnormality identified. IMPRESSION: Stable cardiomegaly and left pleural effusion with atelectasis. Electronically Signed   By:  Odessa Fleming M.D.   On: 10/27/2022 07:00   ECHOCARDIOGRAM COMPLETE  Result Date: 10/26/2022    ECHOCARDIOGRAM REPORT   Patient Name:   Mason Hill Date of Exam: 10/26/2022 Medical Rec #:  [id number]    Height:       72.0 in Accession #:    [id number]   Weight:       200.0 lb Date of Birth:  07/21/1940    BSA:          2.131 m Patient Age:    82 years     BP:           122/55 mmHg Patient Gender: M            HR:           86 bpm. Exam Location:  Inpatient Procedure: 2D Echo, Cardiac Doppler and Color Doppler Indications:    NSTEMI I21.4  History:        Patient has no prior history of Echocardiogram examinations.  Sonographer:    Harriette Bouillon RDCS Referring Phys: [phone] SUBRINA SUNDIL IMPRESSIONS  1. Left ventricular ejection fraction, by estimation, is 60 to 65%. Left ventricular ejection fraction by PLAX is 62 %. The left ventricle has normal function. The left ventricle has no regional wall motion abnormalities. Left ventricular diastolic parameters are indeterminate. There is the interventricular septum is flattened in systole, consistent with right ventricular pressure overload.  2. Right ventricular systolic function is severely reduced. The right ventricular size is moderately enlarged. There is mildly elevated pulmonary artery systolic pressure. The estimated right ventricular systolic pressure is 42.7 mmHg.  3. Right atrial size was severely dilated.  4. Pericardial fluid measures 1.36 cm over the  RV apex and up to 2.29 cm around the right atrium.. Moderate pericardial effusion. The pericardial effusion is anterior to the right ventricle.  5. The mitral valve is degenerative. Trivial mitral valve regurgitation.  6. The aortic valve is tricuspid. Aortic valve regurgitation is not visualized.  7. Aortic dilatation noted. There is borderline dilatation of the aortic root, measuring 39 mm.  8. The inferior vena cava is dilated in size with <50% respiratory variability, suggesting right atrial pressure of 15 mmHg. Comparison(s): No prior Echocardiogram. Conclusion(s)/Recommendation(s): Moderate sized pericardial effusion. Cannot exclude tamponade physiology given severe RV dysfunction. Clinical correlation is advised. FINDINGS  Left Ventricle: Left ventricular ejection fraction, by estimation, is 60 to 65%. Left ventricular ejection fraction by PLAX is 62 %. The left ventricle has normal function. The left ventricle has no regional wall motion abnormalities. The left ventricular internal cavity size was normal in size. There is no left ventricular hypertrophy. The interventricular septum is flattened in systole, consistent with right ventricular pressure overload. Left ventricular diastolic parameters are indeterminate. Right Ventricle: The right ventricular size is moderately enlarged. No increase in right ventricular wall thickness. Right ventricular systolic function is severely reduced. There is mildly elevated pulmonary artery systolic pressure. The tricuspid regurgitant velocity is 2.63 m/s, and with an assumed right atrial pressure of 15 mmHg, the estimated right ventricular systolic pressure is 42.7 mmHg. Left Atrium: Left atrial size was normal in size. Right Atrium: Right atrial size was severely dilated. Pericardium: Pericardial fluid measures 1.36 cm over the RV apex and up to 2.29 cm around the right atrium. A moderately sized pericardial effusion is present. The pericardial effusion is anterior to  the right ventricle. Mitral Valve: The mitral valve is degenerative in appearance. There is mild calcification of the anterior  and posterior mitral valve leaflet(s). Mild to moderate mitral annular calcification. Trivial mitral valve regurgitation. Tricuspid Valve: The tricuspid valve is grossly normal. Tricuspid valve regurgitation is mild. Aortic Valve: The aortic valve is tricuspid. Aortic valve regurgitation is not visualized. Pulmonic Valve: The pulmonic valve was grossly normal. Pulmonic valve regurgitation is trivial. Aorta: Aortic dilatation noted. There is borderline dilatation of the aortic root, measuring 39 mm. Venous: The inferior vena cava is dilated in size with less than 50% respiratory variability, suggesting right atrial pressure of 15 mmHg. IAS/Shunts: No atrial level shunt detected by color flow Doppler.  LEFT VENTRICLE PLAX 2D LV EF:         Left            Diastology                ventricular     LV e' medial:    5.77 cm/s                ejection        LV E/e' medial:  18.7                fraction by     LV e' lateral:   8.05 cm/s                PLAX is 62      LV E/e' lateral: 13.4                %. LVIDd:         5.30 cm LVIDs:         3.50 cm LV PW:         1.00 cm LV IVS:        1.00 cm LVOT diam:     2.20 cm LV SV:         76 LV SV Index:   36 LVOT Area:     3.80 cm  RIGHT VENTRICLE         IVC TAPSE (M-mode): 0.6 cm  IVC diam: 3.80 cm LEFT ATRIUM             Index        RIGHT ATRIUM           Index LA diam:        4.40 cm 2.07 cm/m   RA Area:     27.50 cm LA Vol (A2C):   50.1 ml 23.51 ml/m  RA Volume:   101.00 ml 47.41 ml/m LA Vol (A4C):   26.2 ml 12.30 ml/m LA Biplane Vol: 38.8 ml 18.21 ml/m  AORTIC VALVE LVOT Vmax:   102.00 cm/s LVOT Vmean:  72.200 cm/s LVOT VTI:    0.201 m  AORTA Ao Root diam: 3.90 cm Ao Asc diam:  3.50 cm MITRAL VALVE                TRICUSPID VALVE MV Area (PHT): 4.77 cm     TR Peak grad:   27.7 mmHg MV Decel Time: 159 msec     TR Vmax:        263.00 cm/s  MV E velocity: 108.00 cm/s                             SHUNTS                             Systemic  VTI:  0.20 m                             Systemic Diam: 2.20 cm Zoila Shutter MD Electronically signed by Zoila Shutter MD Signature Date/Time: 10/26/2022/2:41:21 PM    Final    CT CHEST ABDOMEN PELVIS W CONTRAST  Result Date: 10/25/2022 CLINICAL DATA:  Chest pain EXAM: CT CHEST, ABDOMEN, AND PELVIS WITH CONTRAST TECHNIQUE: Multidetector CT imaging of the chest, abdomen and pelvis was performed following the standard protocol during bolus administration of intravenous contrast. RADIATION DOSE REDUCTION: This exam was performed according to the departmental dose-optimization program which includes automated exposure control, adjustment of the mA and/or kV according to patient size and/or use of iterative reconstruction technique. CONTRAST:  75mL OMNIPAQUE IOHEXOL 350 MG/ML SOLN COMPARISON:  None Available. FINDINGS: Limited exam due to anasarca and streak artifact related to patient arm position. CT CHEST FINDINGS Cardiovascular: Cardiomegaly. Moderate pericardial effusion. Caliber thoracic aorta with moderate atherosclerotic disease. Severe coronary artery calcifications. Mediastinum/Nodes: Esophagus and thyroid are unremarkable. No enlarged lymph nodes seen in the chest. Lungs/Pleura: Central airways are patent. Small left-greater-than-right pleural effusions and atelectasis. No pneumothorax. Musculoskeletal: No chest wall mass or suspicious bone lesions identified. CT ABDOMEN PELVIS FINDINGS Hepatobiliary: No focal liver abnormality is seen, although evaluation of the liver is greatly limited due to artifact. Gallbladder is grossly normal. No evidence of biliary ductal dilation. Pancreas: Unremarkable. Spleen: Normal in size without focal abnormality. Adrenals/Urinary Tract: Bilateral adrenal glands are unremarkable. No hydronephrosis. Nonobstructing left renal stone. Bilateral low-attenuation renal lesions which  are likely simple cysts, artifact limits evaluation, no specific follow-up imaging is necessary. Bladder wall thickening. Stomach/Bowel: Stomach is within normal limits. No definite bowel wall thickening, although evaluation of the bowel loops is markedly limited. No evidence of obstruction. Vascular/Lymphatic: Aortic atherosclerosis. Mildly enlarged periaortic lymph node measuring 12 mm in short axis on series 3, image 75. Bilateral enlarged inguinal lymph nodes. Reference left inguinal lymph node measuring 1.6 mm in short axis on series 3, image 127. Reproductive: Prostate is unremarkable. Other: Diffuse body wall edema. Small fat containing periumbilical hernia. Diffuse mesenteric stranding and trace abdominopelvic ascites. Musculoskeletal: No acute or significant osseous findings. IMPRESSION: 1. Cardiomegaly and moderate pericardial effusion. 2. Small left-greater-than-right pleural effusions and atelectasis. 3. Evaluation of the abdomen and pelvis is markedly limited due to streak artifact and diffuse anasarca. 4. Diffuse mesenteric stranding and trace abdominopelvic ascites, possibly related to volume status. 5. Bladder wall thickening, findings can be seen in the setting of cystitis. Correlate with urinalysis. 6. Enlarged periaortic and bilateral inguinal lymph nodes, possibly reactive. Consider follow-up CT of the abdomen and pelvis in 3 months to ensure resolution. 7. Severe coronary artery calcifications and aortic Atherosclerosis (ICD10-I70.0). Electronically Signed   By: Allegra Lai M.D.   On: 10/25/2022 18:41   DG Chest Port 1 View  Result Date: 10/25/2022 CLINICAL DATA:  Shortness of breath EXAM: PORTABLE CHEST 1 VIEW COMPARISON:  X-ray 04/14/2009 FINDINGS: Enlarged cardiopericardial silhouette. Calcified aorta. Prominent central vasculature. Tiny left effusion. Mild lung base opacities. Overlapping cardiac leads. IMPRESSION: Enlarged cardiopericardial silhouette. Tiny left effusion. Mild lung  base opacities, left-greater-than-right. Subtle infiltrates possible. Recommend follow-up. Electronically Signed   By: Karen Kays M.D.   On: 10/25/2022 15:21   \   Subjective: No significant events overnight, he denies any complaints today.  Discharge Exam: Vitals:   11/03/22 0300 11/03/22 0755  BP: 107/60 117/64  Pulse:  70  Resp: 17 17  Temp: 98.6 F (37 C) 98.9 F (37.2 C)  SpO2:  94%   Vitals:   11/03/22 0000 11/03/22 0300 11/03/22 0735 11/03/22 0755  BP: 107/60 107/60  117/64  Pulse: 69   70  Resp: 18 17  17   Temp: 98 F (36.7 C) 98.6 F (37 C)  98.9 F (37.2 C)  TempSrc: Oral Oral  Oral  SpO2: 95%   94%  Weight:   87.3 kg   Height:        General: Pt is alert, awake, not in acute distress Cardiovascular: RRR, S1/S2 +, no rubs, no gallops Respiratory: CTA bilaterally, no wheezing, no rhonchi Abdominal: Soft, NT, ND, bowel sounds + Extremities: Extremity edema much improved    The results of significant diagnostics from this hospitalization (including imaging, microbiology, ancillary and laboratory) are listed below for reference.     Microbiology: Recent Results (from the past 240 hour(s))  Culture, blood (Routine x 2)     Status: Abnormal   Collection Time: 10/25/22  2:23 PM   Specimen: BLOOD RIGHT ARM  Result Value Ref Range Status   Specimen Description BLOOD RIGHT ARM  Final   Special Requests   Final    BOTTLES DRAWN AEROBIC AND ANAEROBIC Blood Culture results may not be optimal due to an excessive volume of blood received in culture bottles   Culture  Setup Time   Final    GRAM POSITIVE COCCI IN BOTH AEROBIC AND ANAEROBIC BOTTLES CRITICAL VALUE NOTED.  VALUE IS CONSISTENT WITH PREVIOUSLY REPORTED AND CALLED VALUE.    Culture (A)  Final    GROUP B STREP(S.AGALACTIAE)ISOLATED SUSCEPTIBILITIES PERFORMED ON PREVIOUS CULTURE WITHIN THE LAST 5 DAYS. Performed at [facility], [address] [place], [place] [address]    Report Status  10/28/2022 FINAL  Final  SARS Coronavirus 2 by RT PCR (hospital order, performed in [facility] hospital lab) *cepheid single result test* Anterior Nasal Swab     Status: None   Collection Time: 10/25/22  2:30 PM   Specimen: Anterior Nasal Swab  Result Value Ref Range Status   SARS Coronavirus 2 by RT PCR NEGATIVE NEGATIVE Final    Comment: Performed at [facility], [address], [place], [place] [address]  Culture, blood (Routine x 2)     Status: Abnormal   Collection Time: 10/25/22  2:50 PM   Specimen: BLOOD RIGHT HAND  Result Value Ref Range Status   Specimen Description BLOOD RIGHT HAND  Final   Special Requests   Final    BOTTLES DRAWN AEROBIC AND ANAEROBIC Blood Culture adequate volume   Culture  Setup Time   Final    GRAM POSITIVE COCCI ANAEROBIC BOTTLE ONLY CRITICAL RESULT CALLED TO, READ BACK BY AND VERIFIED WITH: J [facility]  10/26/22 MK Performed at [facility], [address] [place], [place] [address]    Culture GROUP B STREP(S.AGALACTIAE)ISOLATED (A)  Final   Report Status 10/28/2022 FINAL  Final   Organism ID, Bacteria GROUP B STREP(S.AGALACTIAE)ISOLATED  Final      Susceptibility   Group b strep(s.agalactiae)isolated - MIC*    CLINDAMYCIN >=1 RESISTANT Resistant     AMPICILLIN <=0.25 SENSITIVE Sensitive     ERYTHROMYCIN >=8 RESISTANT Resistant     VANCOMYCIN 0.5 SENSITIVE Sensitive     CEFTRIAXONE <=0.12 SENSITIVE Sensitive     LEVOFLOXACIN 1 SENSITIVE Sensitive     PENICILLIN <=0.06 SENSITIVE Sensitive     * GROUP  B STREP(S.AGALACTIAE)ISOLATED  Blood Culture ID Panel (Reflexed)     Status: Abnormal   Collection Time: 10/25/22  2:50 PM  Result Value Ref Range Status   Enterococcus faecalis NOT DETECTED NOT DETECTED Final   Enterococcus Faecium NOT DETECTED NOT DETECTED Final   Listeria monocytogenes NOT DETECTED NOT DETECTED Final   Staphylococcus species NOT DETECTED NOT DETECTED Final   Staphylococcus aureus (BCID) NOT DETECTED NOT  DETECTED Final   Staphylococcus epidermidis NOT DETECTED NOT DETECTED Final   Staphylococcus lugdunensis NOT DETECTED NOT DETECTED Final   Streptococcus species DETECTED (A) NOT DETECTED Final    Comment: CRITICAL RESULT CALLED TO, READ BACK BY AND VERIFIED WITH: J WYLAND,PHARMD@0525  10/26/22 MK    Streptococcus agalactiae DETECTED (A) NOT DETECTED Final    Comment: CRITICAL RESULT CALLED TO, READ BACK BY AND VERIFIED WITH: J WYLAND,PHARMD@0525  10/26/22 MK    Streptococcus pneumoniae NOT DETECTED NOT DETECTED Final   Streptococcus pyogenes NOT DETECTED NOT DETECTED Final   A.calcoaceticus-baumannii NOT DETECTED NOT DETECTED Final   Bacteroides fragilis NOT DETECTED NOT DETECTED Final   Enterobacterales NOT DETECTED NOT DETECTED Final   Enterobacter cloacae complex NOT DETECTED NOT DETECTED Final   Escherichia coli NOT DETECTED NOT DETECTED Final   Klebsiella aerogenes NOT DETECTED NOT DETECTED Final   Klebsiella oxytoca NOT DETECTED NOT DETECTED Final   Klebsiella pneumoniae NOT DETECTED NOT DETECTED Final   Proteus species NOT DETECTED NOT DETECTED Final   Salmonella species NOT DETECTED NOT DETECTED Final   Serratia marcescens NOT DETECTED NOT DETECTED Final   Haemophilus influenzae NOT DETECTED NOT DETECTED Final   Neisseria meningitidis NOT DETECTED NOT DETECTED Final   Pseudomonas aeruginosa NOT DETECTED NOT DETECTED Final   Stenotrophomonas maltophilia NOT DETECTED NOT DETECTED Final   Candida albicans NOT DETECTED NOT DETECTED Final   Candida auris NOT DETECTED NOT DETECTED Final   Candida glabrata NOT DETECTED NOT DETECTED Final   Candida krusei NOT DETECTED NOT DETECTED Final   Candida parapsilosis NOT DETECTED NOT DETECTED Final   Candida tropicalis NOT DETECTED NOT DETECTED Final   Cryptococcus neoformans/gattii NOT DETECTED NOT DETECTED Final    Comment: Performed at [facility], [address], [place], [place] [address]  Culture, blood (Routine X 2) w Reflex  to ID Panel     Status: None   Collection Time: 10/27/22  6:50 AM   Specimen: BLOOD LEFT ARM  Result Value Ref Range Status   Specimen Description BLOOD LEFT ARM  Final   Special Requests   Final    BOTTLES DRAWN AEROBIC AND ANAEROBIC Blood Culture adequate volume   Culture   Final    NO GROWTH 5 DAYS Performed at [facility], [address] [place], [place] [address]    Report Status 11/01/2022 FINAL  Final  Culture, blood (Routine X 2) w Reflex to ID Panel     Status: None   Collection Time: 10/27/22  6:59 AM   Specimen: BLOOD RIGHT ARM  Result Value Ref Range Status   Specimen Description BLOOD RIGHT ARM  Final   Special Requests   Final    BOTTLES DRAWN AEROBIC AND ANAEROBIC Blood Culture results may not be optimal due to an excessive volume of blood received in culture bottles   Culture   Final    NO GROWTH 5 DAYS Performed at [facility], [address] [place], [place] [address]    Report Status 11/01/2022 FINAL  Final  Labs: BNP (last 3 results) Recent Labs    10/25/22 1500  BNP 684.8*   Basic Metabolic Panel: Recent Labs  Lab 10/28/22 0334 10/29/22 0259 10/30/22 0649 10/31/22 0700 11/01/22 0403 11/02/22 0344 11/03/22 0243  NA 139 137 139 135 138 138 133*  K 3.9 4.1 3.9 4.2 3.8 3.9 3.8  CL 101 99 99 97* 99 97* 94*  CO2 30 28 31 29  32 33* 31  GLUCOSE 115* 144* 132* 183* 120* 143* 137*  BUN 26* 25* 18 19 18  26* 27*  CREATININE 1.14 1.07 0.95 0.91 1.01 1.06 0.97  CALCIUM 8.0* 8.2* 8.2* 8.2* 8.0* 8.3* 8.0*  MG 1.8 1.6* 1.8 1.8  --   --   --    Liver Function Tests: Recent Labs  Lab 10/30/22 0649 10/31/22 0700 11/01/22 0403 11/02/22 0344 11/03/22 0243  AST 29 34 29 35 31  ALT 18 25 22 24 24   ALKPHOS 100 116 103 98 96  BILITOT 0.5 0.7 0.5 0.5 0.4  PROT 4.7* 5.0* 4.7* 5.2* 5.2*  ALBUMIN 1.6* 1.7* 1.6* 2.5* 2.3*   No results for input(s): "LIPASE", "AMYLASE" in the last 168 hours. No results for input(s): "AMMONIA" in the last  168 hours. CBC: Recent Labs  Lab 10/30/22 0649 10/31/22 0700 11/01/22 0403 11/02/22 0344 11/03/22 0243  WBC 7.1 10.1 11.7* 9.8 9.9  HGB 11.0* 11.5* 10.5* 9.5* 10.0*  HCT 33.8* 35.2* 32.8* 29.8* 31.8*  MCV 92.9 92.1 95.9 93.7 93.5  PLT 179 239 249 265 307   Cardiac Enzymes: No results for input(s): "CKTOTAL", "CKMB", "CKMBINDEX", "TROPONINI" in the last 168 hours. BNP: Invalid input(s): "POCBNP" CBG: Recent Labs  Lab 11/02/22 0747 11/02/22 1317 11/02/22 1554 11/02/22 2213 11/03/22 0846  GLUCAP 127* 181* 159* 143* 180*   D-Dimer No results for input(s): "DDIMER" in the last 72 hours. Hgb A1c No results for input(s): "HGBA1C" in the last 72 hours. Lipid Profile No results for input(s): "CHOL", "HDL", "LDLCALC", "TRIG", "CHOLHDL", "LDLDIRECT" in the last 72 hours. Thyroid function studies No results for input(s): "TSH", "T4TOTAL", "T3FREE", "THYROIDAB" in the last 72 hours.  Invalid input(s): "FREET3" Anemia work up No results for input(s): "VITAMINB12", "FOLATE", "FERRITIN", "TIBC", "IRON", "RETICCTPCT" in the last 72 hours. Urinalysis    Component Value Date/Time   COLORURINE AMBER (A) 10/25/2022 1551   APPEARANCEUR CLEAR 10/25/2022 1551   LABSPEC 1.020 10/25/2022 1551   PHURINE 5.0 10/25/2022 1551   GLUCOSEU NEGATIVE 10/25/2022 1551   HGBUR NEGATIVE 10/25/2022 1551   BILIRUBINUR NEGATIVE 10/25/2022 1551   KETONESUR NEGATIVE 10/25/2022 1551   PROTEINUR NEGATIVE 10/25/2022 1551   NITRITE NEGATIVE 10/25/2022 1551   LEUKOCYTESUR NEGATIVE 10/25/2022 1551   Sepsis Labs Recent Labs  Lab 10/31/22 0700 11/01/22 0403 11/02/22 0344 11/03/22 0243  WBC 10.1 11.7* 9.8 9.9   Microbiology Recent Results (from the past 240 hour(s))  Culture, blood (Routine x 2)     Status: Abnormal   Collection Time: 10/25/22  2:23 PM   Specimen: BLOOD RIGHT ARM  Result Value Ref Range Status   Specimen Description BLOOD RIGHT ARM  Final   Special Requests   Final    BOTTLES  DRAWN AEROBIC AND ANAEROBIC Blood Culture results may not be optimal due to an excessive volume of blood received in culture bottles   Culture  Setup Time   Final    GRAM POSITIVE COCCI IN BOTH AEROBIC AND ANAEROBIC BOTTLES CRITICAL VALUE NOTED.  VALUE IS CONSISTENT WITH PREVIOUSLY REPORTED AND CALLED VALUE.  Culture (A)  Final    GROUP B STREP(S.AGALACTIAE)ISOLATED SUSCEPTIBILITIES PERFORMED ON PREVIOUS CULTURE WITHIN THE LAST 5 DAYS. Performed at [facility], [address] [place], [place] [address]    Report Status 10/28/2022 FINAL  Final  SARS Coronavirus 2 by RT PCR (hospital order, performed in [facility] hospital lab) *cepheid single result test* Anterior Nasal Swab     Status: None   Collection Time: 10/25/22  2:30 PM   Specimen: Anterior Nasal Swab  Result Value Ref Range Status   SARS Coronavirus 2 by RT PCR NEGATIVE NEGATIVE Final    Comment: Performed at [facility], [address] [place], [place] [address]  Culture, blood (Routine x 2)     Status: Abnormal   Collection Time: 10/25/22  2:50 PM   Specimen: BLOOD RIGHT HAND  Result Value Ref Range Status   Specimen Description BLOOD RIGHT HAND  Final   Special Requests   Final    BOTTLES DRAWN AEROBIC AND ANAEROBIC Blood Culture adequate volume   Culture  Setup Time   Final    GRAM POSITIVE COCCI ANAEROBIC BOTTLE ONLY CRITICAL RESULT CALLED TO, READ BACK BY AND VERIFIED WITH: J WYLAND,PHARMD@0528  10/26/22 MK Performed at [facility], [address], [place], [place] [address]    Culture GROUP B STREP(S.AGALACTIAE)ISOLATED (A)  Final   Report Status 10/28/2022 FINAL  Final   Organism ID, Bacteria GROUP B STREP(S.AGALACTIAE)ISOLATED  Final      Susceptibility   Group b strep(s.agalactiae)isolated - MIC*    CLINDAMYCIN >=1 RESISTANT Resistant     AMPICILLIN <=0.25 SENSITIVE Sensitive     ERYTHROMYCIN >=8 RESISTANT Resistant     VANCOMYCIN 0.5 SENSITIVE Sensitive     CEFTRIAXONE <=0.12  SENSITIVE Sensitive     LEVOFLOXACIN 1 SENSITIVE Sensitive     PENICILLIN <=0.06 SENSITIVE Sensitive     * GROUP B STREP(S.AGALACTIAE)ISOLATED  Blood Culture ID Panel (Reflexed)     Status: Abnormal   Collection Time: 10/25/22  2:50 PM  Result Value Ref Range Status   Enterococcus faecalis NOT DETECTED NOT DETECTED Final   Enterococcus Faecium NOT DETECTED NOT DETECTED Final   Listeria monocytogenes NOT DETECTED NOT DETECTED Final   Staphylococcus species NOT DETECTED NOT DETECTED Final   Staphylococcus aureus (BCID) NOT DETECTED NOT DETECTED Final   Staphylococcus epidermidis NOT DETECTED NOT DETECTED Final   Staphylococcus lugdunensis NOT DETECTED NOT DETECTED Final   Streptococcus species DETECTED (A) NOT DETECTED Final    Comment: CRITICAL RESULT CALLED TO, READ BACK BY AND VERIFIED WITH: J WYLAND,PHARMD@0525  10/26/22 MK    Streptococcus agalactiae DETECTED (A) NOT DETECTED Final    Comment: CRITICAL RESULT CALLED TO, READ BACK BY AND VERIFIED WITH: J WYLAND,PHARMD@0525  10/26/22 MK    Streptococcus pneumoniae NOT DETECTED NOT DETECTED Final   Streptococcus pyogenes NOT DETECTED NOT DETECTED Final   A.calcoaceticus-baumannii NOT DETECTED NOT DETECTED Final   Bacteroides fragilis NOT DETECTED NOT DETECTED Final   Enterobacterales NOT DETECTED NOT DETECTED Final   Enterobacter cloacae complex NOT DETECTED NOT DETECTED Final   Escherichia coli NOT DETECTED NOT DETECTED Final   Klebsiella aerogenes NOT DETECTED NOT DETECTED Final   Klebsiella oxytoca NOT DETECTED NOT DETECTED Final   Klebsiella pneumoniae NOT DETECTED NOT DETECTED Final   Proteus species NOT DETECTED NOT DETECTED Final   Salmonella species NOT DETECTED NOT DETECTED Final   Serratia marcescens NOT DETECTED NOT DETECTED Final   Haemophilus influenzae NOT DETECTED NOT DETECTED Final  Neisseria meningitidis NOT DETECTED NOT DETECTED Final   Pseudomonas aeruginosa NOT DETECTED NOT DETECTED Final   Stenotrophomonas  maltophilia NOT DETECTED NOT DETECTED Final   Candida albicans NOT DETECTED NOT DETECTED Final   Candida auris NOT DETECTED NOT DETECTED Final   Candida glabrata NOT DETECTED NOT DETECTED Final   Candida krusei NOT DETECTED NOT DETECTED Final   Candida parapsilosis NOT DETECTED NOT DETECTED Final   Candida tropicalis NOT DETECTED NOT DETECTED Final   Cryptococcus neoformans/gattii NOT DETECTED NOT DETECTED Final    Comment: Performed at [facility] Lab, [address] [place], [place] [address]  Culture, blood (Routine X 2) w Reflex to ID Panel     Status: None   Collection Time: 10/27/22  6:50 AM   Specimen: BLOOD LEFT ARM  Result Value Ref Range Status   Specimen Description BLOOD LEFT ARM  Final   Special Requests   Final    BOTTLES DRAWN AEROBIC AND ANAEROBIC Blood Culture adequate volume   Culture   Final    NO GROWTH 5 DAYS Performed at [facility], [address], [place], [place] [address]    Report Status 11/01/2022 FINAL  Final  Culture, blood (Routine X 2) w Reflex to ID Panel     Status: None   Collection Time: 10/27/22  6:59 AM   Specimen: BLOOD RIGHT ARM  Result Value Ref Range Status   Specimen Description BLOOD RIGHT ARM  Final   Special Requests   Final    BOTTLES DRAWN AEROBIC AND ANAEROBIC Blood Culture results may not be optimal due to an excessive volume of blood received in culture bottles   Culture   Final    NO GROWTH 5 DAYS Performed at [facility], [address] [place], [place] [address]    Report Status 11/01/2022 FINAL  Final     Time coordinating discharge: Over 30 minutes  SIGNED:   Huey Bienenstock, MD  [facility] 11/03/2022, 10:54 AM Pager   If 7PM-7AM, please contact night-coverage [email or web address] Password [email or web address]

## 2022-11-03 NOTE — TOC Transition Note (Signed)
Transition of Care [facility]) - CM/SW Discharge Note   Patient Details  Name: Mason Hill MRN: [id number] Date of Birth: [birth year]  [facility] CM/SW Contact:  Lawerance Sabal, RN Phone Number: 11/03/2022, 11:09 AM   Clinical Narrative:     Notified by home IV agency liaison Pam, and [facility] RN liaison that patient would likely DC to home today with home IV meds.  Patient will need an extender for PICC line, I have notified the bedside nurse this morning at 08:30 to make sure he has one for DC.  HH services through [facility], meds through [facility], both notified of DC order.   Final next level of care: Home w Home Health Services Barriers to Discharge: No Barriers Identified   Patient Goals and CMS Choice      Discharge Placement                         Discharge Plan and Services Additional resources added to the After Visit Summary for                              [facility] Agency: [facility] Date [facility] Agency Contacted: 11/03/22 Time HH Agency Contacted: [phone] Representative spoke with at [facility] Agency: Kandee Keen  Social Determinants of Health (SDOH) Interventions SDOH Screenings   Food Insecurity: No Food Insecurity (11/02/2022)  Housing: Low Risk  (11/02/2022)  Transportation Needs: No Transportation Needs (11/02/2022)  Utilities: Not At Risk (11/02/2022)  Financial Resource Strain: Low Risk  (06/21/2022)   Received from [facility]  Physical Activity: Insufficiently Active (07/24/2019)   Received from [facility]  Social Connections: Socially Integrated (07/24/2019)   Received from [facility]  Stress: No Stress Concern Present (07/24/2019)   Received from [facility]  Tobacco Use: Low Risk  (10/30/2022)     Readmission Risk Interventions    10/29/2022    2:39 PM  Readmission Risk Prevention Plan  Transportation Screening Complete  PCP or Specialist Appt within 5-7 Days Complete  Home Care  Screening Complete  Medication Review (RN CM) Referral to Pharmacy

## 2022-11-05 ENCOUNTER — Telehealth: Payer: Self-pay

## 2022-11-05 NOTE — Telephone Encounter (Signed)
Received call from [facility] from [place]. Reports delay to start of care due to patient not opening his supplies and delivered medications until 9/9(today) Supplies were delivered on 11/03/22. Notifying provider.  Valarie Cones, LPN

## 2022-11-05 NOTE — Telephone Encounter (Signed)
Okay to extend 2 days to complete total of 6 weeks with new end date 10/13 and can be shortened at next office visit if needed. Thanks.

## 2022-11-05 NOTE — Telephone Encounter (Signed)
Staff reached out to [facility] and updated on new end date of 12/09/22  to complete total of 6 weeks antibiotic therapy. Verbal order read back and understood (Ariel).

## 2022-12-04 ENCOUNTER — Inpatient Hospital Stay: Payer: Medicare Other | Admitting: Infectious Diseases

## 2022-12-04 NOTE — Progress Notes (Deleted)
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   Malnutrition of moderate degree 10/27/2022   Severe sepsis (HCC) 10/25/2022   CAP (community acquired pneumonia) 10/25/2022   Pericardial effusion 10/25/2022   Elevated troponin 10/25/2022   Essential hypertension 10/25/2022   Chronic kidney disease (CKD), stage III (moderate) (HCC) 10/25/2022   Paroxysmal atrial fibrillation (HCC) 10/25/2022   DM (diabetes mellitus) type II controlled with renal manifestation (HCC) 10/25/2022   Chronic systolic CHF (congestive heart failure) (HCC) 10/25/2022   History of coronary artery disease - small OM occlusion; med Rx 10/25/2022   Hypokalemia 10/25/2022    Patient's Medications  New Prescriptions   No medications on file  Previous Medications   APIXABAN (ELIQUIS) 5 MG TABS TABLET    Take 1 tablet (5 mg total) by mouth 2 (two) times daily.   ATORVASTATIN (LIPITOR) 40 MG TABLET    Take 1 tablet (40 mg total) by mouth daily.   FUROSEMIDE (LASIX) 40 MG TABLET    Take 1 tablet (40 mg total) by mouth 2 (two) times daily.   METOPROLOL TARTRATE (LOPRESSOR) 25 MG TABLET    Take 1 tablet (25 mg total) by mouth 2 (two) times daily.   PENICILLIN G IVPB    Inject 24 Million Units into the vein daily. As Continuous Infusion Indication:  Group B Strep MV Endocarditis First Dose: Yes Last Day of Therapy:  12/07/2022 Labs - Once weekly:  CBC/D and BMP, Labs - Once weekly: ESR and CRP Method of administration: Elastomeric (Continuous infusion) Method of administration may be changed at the discretion of home infusion pharmacist based upon assessment of the patient and/or caregiver's ability to self-administer the medication ordered.   SPIRONOLACTONE (ALDACTONE) 50 MG TABLET    Take 0.5 tablets (25 mg total) by mouth daily.  Modified Medications   No medications on file  Discontinued Medications   No medications on file    Subjective: ***    Review of Systems: ROS  Past Medical History:  Diagnosis Date   A-fib (HCC)    AMI (acute myocardial infarction) (HCC)    Diabetes mellitus without complication (HCC)    Hypertension     Social History   Tobacco Use   Smoking status: Never   Smokeless tobacco: Never  Vaping Use   Vaping status: Never Used  Substance Use Topics   Alcohol use: Never   Drug use: Never    No family history on file.  Allergies  Allergen Reactions   Codeine Other (See Comments) and Rash    Other Reaction: BLISTERING, PEELING.   Erythromycin    Isosorbide Nitrate     Other reaction(s): Headache    Health Maintenance  Topic Date Due   FOOT EXAM  Never done   OPHTHALMOLOGY EXAM  Never done   Diabetic kidney evaluation - Urine ACR  Never done   DTaP/Tdap/Td (1 - Tdap) Never done   Zoster Vaccines- Shingrix (1 of 2) Never done   Medicare Annual Wellness (AWV)  09/05/2022   COVID-19 Vaccine (4 - 2023-24 season) 10/28/2022   HEMOGLOBIN A1C  04/26/2023   Diabetic kidney evaluation - eGFR measurement  11/03/2023   Pneumonia Vaccine 70+ Years old  Completed   INFLUENZA VACCINE  Completed   HPV VACCINES  Aged Out    Objective:  There were no vitals filed for this visit. There is no height or weight on file to  calculate BMI.  Physical Exam Constitutional:      Appearance: Normal appearance.  HENT:     Head: Normocephalic and atraumatic.      Mouth: Mucous membranes are moist.  Eyes:    Conjunctiva/sclera: Conjunctivae normal.     Pupils: Pupils are equal, round, and reactive to light.   Cardiovascular:     Rate and Rhythm: Normal rate and regular rhythm.     Heart sounds: No murmur heard. No friction rub. No gallop.   Pulmonary:     Effort: Pulmonary effort is normal.     Breath sounds: Normal breath sounds.   Abdominal:     General: Non distended     Palpations: soft.   Musculoskeletal:        General: Normal range of motion.   Skin:    General: Skin is warm and  dry.     Comments:  Neurological:     General: grossly non focal     Mental Status: awake, alert and oriented to person, place, and time.   Psychiatric:        Mood and Affect: Mood normal.   Lab Results Lab Results  Component Value Date   WBC 9.9 11/03/2022   HGB 10.0 (L) 11/03/2022   HCT 31.8 (L) 11/03/2022   MCV 93.5 11/03/2022   PLT 307 11/03/2022    Lab Results  Component Value Date   CREATININE 0.97 11/03/2022   BUN 27 (H) 11/03/2022   NA 133 (L) 11/03/2022   K 3.8 11/03/2022   CL 94 (L) 11/03/2022   CO2 31 11/03/2022    Lab Results  Component Value Date   ALT 24 11/03/2022   AST 31 11/03/2022   ALKPHOS 96 11/03/2022   BILITOT 0.4 11/03/2022    Lab Results  Component Value Date   CHOL 143 10/27/2022   HDL 42 10/27/2022   LDLCALC 82 10/27/2022   TRIG 95 10/27/2022   CHOLHDL 3.4 10/27/2022   No results found for: "LABRPR", "RPRTITER" No results found for: "HIV1RNAQUANT", "HIV1RNAVL", "CD4TABS"   Problem List Items Addressed This Visit   None   I have personally spent at least 60 minutes involved in face-to-face and non-face-to-face activities for this patient on the day of the visit. Professional time spent includes the following activities: Preparing to see the patient (review of tests), Obtaining and/or reviewing separately obtained history (admission/discharge record), Performing a medically appropriate examination and/or evaluation , Ordering medications/tests/procedures, referring and communicating with other health care professionals, Documenting clinical information in the EMR, Independently interpreting results (not separately reported), Communicating results to the patient/family/caregiver, Counseling and educating the patient/family/caregiver and Care coordination (not separately reported).   Victoriano Lain, MD [facility] 12/04/2022, 1:40 PM

## 2022-12-07 ENCOUNTER — Other Ambulatory Visit: Payer: Self-pay

## 2022-12-07 ENCOUNTER — Telehealth: Payer: Self-pay

## 2022-12-07 ENCOUNTER — Telehealth: Payer: Medicare Other | Admitting: Infectious Diseases

## 2022-12-07 NOTE — Telephone Encounter (Addendum)
Patient phone is not working for video visit today 12/07/22. I did talk to daughter who states he was fine but got a new phone he can not work, verified number is correct. Per Dr. Elinor Parkinson ok to pull picc after last dose. Spoke to [employer] at [employer] and verified Frances Furbish has been out there advised to her we will order pull picc and I will send message to team. She has reviewed labs.   Advised [facility] ok to pull picc line after last dose and notified [facility] team.

## 2022-12-10 NOTE — Telephone Encounter (Signed)
Lele, RN with [facility] called to ask if it was okay to pull patient PICC line - verbal okay given.    Marni Franzoni Lesli Albee, CMA

## 2022-12-14 ENCOUNTER — Encounter (HOSPITAL_COMMUNITY): Payer: Self-pay

## 2022-12-14 ENCOUNTER — Other Ambulatory Visit: Payer: Self-pay

## 2022-12-14 ENCOUNTER — Emergency Department (HOSPITAL_BASED_OUTPATIENT_CLINIC_OR_DEPARTMENT_OTHER): Payer: Medicare Other

## 2022-12-14 ENCOUNTER — Emergency Department (HOSPITAL_COMMUNITY): Payer: Medicare Other

## 2022-12-14 ENCOUNTER — Inpatient Hospital Stay (HOSPITAL_COMMUNITY)
Admission: EM | Admit: 2022-12-14 | Discharge: 2022-12-19 | DRG: 603 | Disposition: A | Discharge status: Home / Self Care | Payer: Medicare Other | Attending: Family Medicine | Admitting: Family Medicine

## 2022-12-14 DIAGNOSIS — I495 Sick sinus syndrome: Secondary | ICD-10-CM | POA: Diagnosis present

## 2022-12-14 DIAGNOSIS — I4821 Permanent atrial fibrillation: Secondary | ICD-10-CM | POA: Diagnosis present

## 2022-12-14 DIAGNOSIS — E1151 Type 2 diabetes mellitus with diabetic peripheral angiopathy without gangrene: Secondary | ICD-10-CM | POA: Diagnosis present

## 2022-12-14 DIAGNOSIS — I480 Paroxysmal atrial fibrillation: Secondary | ICD-10-CM

## 2022-12-14 DIAGNOSIS — Z800 Family history of malignant neoplasm of digestive organs: Secondary | ICD-10-CM | POA: Diagnosis not present

## 2022-12-14 DIAGNOSIS — R001 Bradycardia, unspecified: Secondary | ICD-10-CM | POA: Diagnosis present

## 2022-12-14 DIAGNOSIS — I3139 Other pericardial effusion (noninflammatory): Secondary | ICD-10-CM | POA: Diagnosis present

## 2022-12-14 DIAGNOSIS — E1122 Type 2 diabetes mellitus with diabetic chronic kidney disease: Secondary | ICD-10-CM | POA: Diagnosis present

## 2022-12-14 DIAGNOSIS — Z8249 Family history of ischemic heart disease and other diseases of the circulatory system: Secondary | ICD-10-CM

## 2022-12-14 DIAGNOSIS — I4729 Other ventricular tachycardia: Secondary | ICD-10-CM

## 2022-12-14 DIAGNOSIS — L03116 Cellulitis of left lower limb: Principal | ICD-10-CM | POA: Diagnosis present

## 2022-12-14 DIAGNOSIS — I2510 Atherosclerotic heart disease of native coronary artery without angina pectoris: Secondary | ICD-10-CM | POA: Diagnosis present

## 2022-12-14 DIAGNOSIS — N179 Acute kidney failure, unspecified: Secondary | ICD-10-CM | POA: Diagnosis present

## 2022-12-14 DIAGNOSIS — N183 Chronic kidney disease, stage 3 unspecified: Secondary | ICD-10-CM | POA: Diagnosis present

## 2022-12-14 DIAGNOSIS — Z79899 Other long term (current) drug therapy: Secondary | ICD-10-CM | POA: Diagnosis not present

## 2022-12-14 DIAGNOSIS — Z881 Allergy status to other antibiotic agents status: Secondary | ICD-10-CM

## 2022-12-14 DIAGNOSIS — I5032 Chronic diastolic (congestive) heart failure: Secondary | ICD-10-CM

## 2022-12-14 DIAGNOSIS — Z7984 Long term (current) use of oral hypoglycemic drugs: Secondary | ICD-10-CM

## 2022-12-14 DIAGNOSIS — Z833 Family history of diabetes mellitus: Secondary | ICD-10-CM | POA: Diagnosis not present

## 2022-12-14 DIAGNOSIS — I252 Old myocardial infarction: Secondary | ICD-10-CM | POA: Diagnosis not present

## 2022-12-14 DIAGNOSIS — R799 Abnormal finding of blood chemistry, unspecified: Secondary | ICD-10-CM

## 2022-12-14 DIAGNOSIS — Z885 Allergy status to narcotic agent status: Secondary | ICD-10-CM

## 2022-12-14 DIAGNOSIS — E785 Hyperlipidemia, unspecified: Secondary | ICD-10-CM | POA: Diagnosis present

## 2022-12-14 DIAGNOSIS — R9431 Abnormal electrocardiogram [ECG] [EKG]: Secondary | ICD-10-CM | POA: Diagnosis not present

## 2022-12-14 DIAGNOSIS — I472 Ventricular tachycardia, unspecified: Secondary | ICD-10-CM | POA: Diagnosis present

## 2022-12-14 DIAGNOSIS — Z888 Allergy status to other drugs, medicaments and biological substances status: Secondary | ICD-10-CM

## 2022-12-14 DIAGNOSIS — I7090 Unspecified atherosclerosis: Secondary | ICD-10-CM | POA: Diagnosis not present

## 2022-12-14 DIAGNOSIS — E46 Unspecified protein-calorie malnutrition: Secondary | ICD-10-CM | POA: Insufficient documentation

## 2022-12-14 DIAGNOSIS — E559 Vitamin D deficiency, unspecified: Secondary | ICD-10-CM | POA: Diagnosis present

## 2022-12-14 DIAGNOSIS — Z7901 Long term (current) use of anticoagulants: Secondary | ICD-10-CM | POA: Diagnosis not present

## 2022-12-14 DIAGNOSIS — I739 Peripheral vascular disease, unspecified: Secondary | ICD-10-CM | POA: Diagnosis not present

## 2022-12-14 DIAGNOSIS — I130 Hypertensive heart and chronic kidney disease with heart failure and stage 1 through stage 4 chronic kidney disease, or unspecified chronic kidney disease: Secondary | ICD-10-CM | POA: Diagnosis present

## 2022-12-14 DIAGNOSIS — B351 Tinea unguium: Secondary | ICD-10-CM | POA: Diagnosis present

## 2022-12-14 DIAGNOSIS — I4891 Unspecified atrial fibrillation: Secondary | ICD-10-CM | POA: Diagnosis not present

## 2022-12-14 DIAGNOSIS — M7989 Other specified soft tissue disorders: Secondary | ICD-10-CM

## 2022-12-14 DIAGNOSIS — I493 Ventricular premature depolarization: Secondary | ICD-10-CM | POA: Diagnosis not present

## 2022-12-14 DIAGNOSIS — I498 Other specified cardiac arrhythmias: Secondary | ICD-10-CM | POA: Diagnosis not present

## 2022-12-14 LAB — BASIC METABOLIC PANEL
Anion gap: 11 (ref 5–15)
BUN: 45 mg/dL — ABNORMAL HIGH (ref 8–23)
CO2: 27 mmol/L (ref 22–32)
Calcium: 8.4 mg/dL — ABNORMAL LOW (ref 8.9–10.3)
Chloride: 103 mmol/L (ref 98–111)
Creatinine, Ser: 1.05 mg/dL (ref 0.61–1.24)
GFR, Estimated: >60 mL/min (ref >60)
Glucose, Bld: 136 mg/dL — ABNORMAL HIGH (ref 70–99)
Potassium: 3.5 mmol/L (ref 3.5–5.1)
Sodium: 141 mmol/L (ref 135–145)

## 2022-12-14 LAB — CBC WITH DIFFERENTIAL/PLATELET
Abs Immature Granulocytes: 0.15 10*3/uL — ABNORMAL HIGH (ref 0.00–0.07)
Basophils Absolute: 0.1 10*3/uL (ref 0.0–0.1)
Basophils Relative: 0 %
Eosinophils Absolute: 0.1 10*3/uL (ref 0.0–0.5)
Eosinophils Relative: 0 %
HCT: 33.3 % — ABNORMAL LOW (ref 39.0–52.0)
Hemoglobin: 10.9 g/dL — ABNORMAL LOW (ref 13.0–17.0)
Immature Granulocytes: 1 %
Lymphocytes Relative: 3 %
Lymphs Abs: 0.6 10*3/uL — ABNORMAL LOW (ref 0.7–4.0)
MCH: 30.7 pg (ref 26.0–34.0)
MCHC: 32.7 g/dL (ref 30.0–36.0)
MCV: 93.8 fL (ref 80.0–100.0)
Monocytes Absolute: 2.1 10*3/uL — ABNORMAL HIGH (ref 0.1–1.0)
Monocytes Relative: 12 %
Neutro Abs: 14.9 10*3/uL — ABNORMAL HIGH (ref 1.7–7.7)
Neutrophils Relative %: 84 %
Platelets: 246 10*3/uL (ref 150–400)
RBC: 3.55 MIL/uL — ABNORMAL LOW (ref 4.22–5.81)
RDW: 14.9 % (ref 11.5–15.5)
WBC: 17.8 10*3/uL — ABNORMAL HIGH (ref 4.0–10.5)
nRBC: 0 % (ref 0.0–0.2)

## 2022-12-14 LAB — I-STAT CG4 LACTIC ACID, ED: Lactic Acid, Venous: 2.1 mmol/L (ref 0.5–1.9)

## 2022-12-14 LAB — CK: Total CK: 278 U/L (ref 49–397)

## 2022-12-14 MED ORDER — ONDANSETRON HCL 4 MG/2ML IJ SOLN
4.0000 mg | Freq: Once | INTRAMUSCULAR | Status: AC
  Administered 2022-12-14: 4 mg via INTRAVENOUS
  Filled 2022-12-14: qty 2

## 2022-12-14 MED ORDER — METOPROLOL TARTRATE 25 MG PO TABS
25.0000 mg | ORAL_TABLET | Freq: Two times a day (BID) | ORAL | Status: DC
  Administered 2022-12-14 – 2022-12-15 (×2): 25 mg via ORAL
  Filled 2022-12-14 (×2): qty 1

## 2022-12-14 MED ORDER — SODIUM CHLORIDE 0.9 % IV SOLN
1.0000 g | Freq: Once | INTRAVENOUS | Status: AC
  Administered 2022-12-14: 1 g via INTRAVENOUS
  Filled 2022-12-14: qty 10

## 2022-12-14 MED ORDER — OXYCODONE-ACETAMINOPHEN 5-325 MG PO TABS
1.0000 | ORAL_TABLET | Freq: Once | ORAL | Status: AC
  Administered 2022-12-14: 1 via ORAL
  Filled 2022-12-14: qty 1

## 2022-12-14 MED ORDER — APIXABAN 5 MG PO TABS
5.0000 mg | ORAL_TABLET | Freq: Two times a day (BID) | ORAL | Status: DC
  Administered 2022-12-14 – 2022-12-16 (×4): 5 mg via ORAL
  Filled 2022-12-14 (×2): qty 1
  Filled 2022-12-14: qty 2
  Filled 2022-12-14: qty 1

## 2022-12-14 MED ORDER — ATORVASTATIN CALCIUM 40 MG PO TABS
40.0000 mg | ORAL_TABLET | Freq: Every day | ORAL | Status: DC
  Administered 2022-12-14 – 2022-12-19 (×6): 40 mg via ORAL
  Filled 2022-12-14 (×6): qty 1

## 2022-12-14 MED ORDER — FUROSEMIDE 40 MG PO TABS
40.0000 mg | ORAL_TABLET | Freq: Two times a day (BID) | ORAL | Status: DC
  Administered 2022-12-14 – 2022-12-19 (×10): 40 mg via ORAL
  Filled 2022-12-14 (×9): qty 1
  Filled 2022-12-14: qty 2

## 2022-12-14 MED ORDER — ACETAMINOPHEN 500 MG PO TABS: 1000.0000 mg | ORAL_TABLET | Freq: Four times a day (QID) | ORAL | Status: DC | PRN

## 2022-12-14 MED ORDER — SODIUM CHLORIDE 0.9 % IV SOLN
1.0000 g | INTRAVENOUS | Status: DC
  Administered 2022-12-15 – 2022-12-17 (×3): 1 g via INTRAVENOUS
  Filled 2022-12-14 (×3): qty 10

## 2022-12-14 MED ORDER — ACETAMINOPHEN 500 MG PO TABS
1000.0000 mg | ORAL_TABLET | Freq: Four times a day (QID) | ORAL | Status: DC
  Administered 2022-12-14 – 2022-12-19 (×17): 1000 mg via ORAL
  Filled 2022-12-14 (×18): qty 2

## 2022-12-14 MED ORDER — SODIUM CHLORIDE 0.9 % IV SOLN: 1.0000 g | INTRAVENOUS | Status: DC

## 2022-12-14 MED ORDER — EMPAGLIFLOZIN 10 MG PO TABS
10.0000 mg | ORAL_TABLET | Freq: Every day | ORAL | Status: DC
  Administered 2022-12-14 – 2022-12-19 (×6): 10 mg via ORAL
  Filled 2022-12-14 (×6): qty 1

## 2022-12-14 MED ORDER — MORPHINE SULFATE (PF) 4 MG/ML IV SOLN
4.0000 mg | Freq: Once | INTRAVENOUS | Status: AC
  Administered 2022-12-14: 4 mg via INTRAVENOUS
  Filled 2022-12-14: qty 1

## 2022-12-14 MED ORDER — OXYCODONE HCL 5 MG PO TABS
5.0000 mg | ORAL_TABLET | Freq: Four times a day (QID) | ORAL | Status: DC | PRN
  Administered 2022-12-14 – 2022-12-19 (×8): 5 mg via ORAL
  Filled 2022-12-14 (×8): qty 1

## 2022-12-14 MED ORDER — SPIRONOLACTONE 25 MG PO TABS
25.0000 mg | ORAL_TABLET | Freq: Every day | ORAL | Status: DC
  Administered 2022-12-14 – 2022-12-19 (×6): 25 mg via ORAL
  Filled 2022-12-14: qty 1
  Filled 2022-12-14: qty 2
  Filled 2022-12-14 (×4): qty 1

## 2022-12-14 NOTE — ED Provider Notes (Signed)
  Physical Exam  BP (!) 106/51   Pulse (!) 48   Temp 98.4 F (36.9 C) (Oral)   Resp 17   Ht 6' (1.829 m)   Wt 87.3 kg   SpO2 100%   BMI 26.10 kg/m   Physical Exam Constitutional:      General: He is not in acute distress. Neurological:     Mental Status: He is alert.         Procedures  Procedures  ED Course / MDM   Clinical Course as of 12/14/22 1525  Fri Dec 14, 2022  1519 S. Edema LLE foot/ankle. Recent admission CHF/PNA. DVT study negative. Pulses present. Getting IV abx for cellulitis. F/u BMP, admit [KM]    Clinical Course User Index [KM] Lyman Speller, MD   Medical Decision Making Amount and/or Complexity of Data Reviewed Labs: ordered. Radiology: ordered.  Risk Prescription drug management. Decision regarding hospitalization.   Signout taken from outgoing ED team.  In brief, this is a 82 year old male with past medical history of CKD, hyperlipidemia, diabetes, CHF, atrial fibrillation on Eliquis, CAD, hypertension, hyperlipidemia, PAD presenting for evaluation of left lower extremity swelling and pain.  This is worsened over the last several days.  Prior ED team has obtained lab work which shows a new leukocytosis of 17.8 with neutrophilic predominance.  DVT ultrasound study were negative for acute thrombotic event.  No abnormal fractures on x-ray.  ED team has treated as cellulitis with Rocephin.  They are planning for admission to the hospitalist team for IV antibiotics for lower extremity cellulitis, but patient's BMP has not yet returned.  Plan at handoff is to follow-up on BMP to make sure there are no gross electrolyte derangements and plan for admission.  BMP reviewed.  Glucose mildly elevated at 136.  BUN is slightly elevated at 45 and calcium is mildly low at 8.4.  No AKI.  No severe electrolyte derangements that would complicate treatment course.  Patient will be admitted to the family medicine resident teaching service.  Handoff given.        Lyman Speller, MD 12/14/22 5621    Gwyneth Sprout, MD 12/14/22 2332

## 2022-12-14 NOTE — Plan of Care (Signed)
CHL Tonsillectomy/Adenoidectomy, Postoperative PEDS care plan entered in error.

## 2022-12-14 NOTE — Plan of Care (Signed)
FMTS Brief Progress Note  S: Patient resting comfortably in bed with wife at bedside.  Reports no concerns at this time and states that his pain is well-controlled.  Patient and wife did bring up that his protein has been low and requested ensures added to his nutrition regimen.   O: BP (!) 141/59 (BP Location: Right Arm)   Pulse 63   Temp 97.7 F (36.5 C) (Oral)   Resp 18   Ht 6' (1.829 m)   Wt 87.3 kg   SpO2 97%   BMI 26.10 kg/m   General: Alert, oriented.  No distress Extremities: Right leg, 1+ edema at the ankle, 2+ pulse palpable.  Left leg 3+ nonpitting edema to the knee, some tension fissures between the great and second toe and the third and fourth toes.  Sole of the foot appears erythematous.  A/P: Patient is currently in stable condition.  We will order Ensure to be supplemented in his diet per his request. - Orders reviewed. Labs for AM ordered, which was adjusted as needed.  - If condition changes, plan includes rapid reassessment.   Gerrit Heck, DO 12/14/2022, 8:30 PM PGY-1, [facility] Family Medicine Night Resident  Please page [phone] with questions.

## 2022-12-14 NOTE — H&P (Cosign Needed Addendum)
Hospital Admission History and Physical Service Pager: [phone]  Patient name: Mason Hill Medical record number: [id number] Date of Birth: 1940-10-12 Age: 82 y.o. Gender: male  Primary Care Provider: Nonda Lou, MD Consultants: None Code Status: Full Preferred Emergency Contact:  Contact Information     Name Relation Home Work Mobile   valladares,wenona Daughter   [phone]      Other Contacts   None on File     Chief Complaint: Left leg foot/ankle swelling  Assessment and Plan: Mason Hill is a 82 y.o. male presenting with a several day history of worsening left lower extremity swelling/pain. Differential for this patient's presentation of this includes:   Cellulitis: Likeliest diagnosis.  Patient describes increasing swelling and pain over past few days/weeks, and 2 days of new warmth.  Patient skin is dry with multiple cracks, likely vector of gram-positive infection.  Pain is dull and greater distally than proximally.  Patient said that this is similar to the previous admission for cellulitis in the past, which improved with antibiotics. Uncomplicated venous stasis c/b lymphedema: Patient has a history of peripheral artery disease as well as lymphedema- skin is dry, flaky, with nonpitting, tree trunk edema, consistent with this diagnosis.  However new warmth, unilateral presentation, and dull pain distally > proximally leans more toward advancing cellulitis. Deep vein thrombosis: Left lower extremity ultrasound negative for venous thrombosis. Patient on Eliquis 5 mg BID at home. Necrotizing fasciitis: On exam, patient wound inconsistent with necrotizing fasciitis - patient pain is also not severe enough, progression is too slow, no gas found on exam. Erysipelas/osteomyelitis: Again, patient pain is dull and lessens gradually.  Erysipelas typically presents with very intense pain on palpation.  X-rays showed no concerning signs were osteo -- would require  MRI/biopsy to rule out entirely. Compartment syndrome: Allodynia not present. No injury reported. Course too slow Assessment & Plan Cellulitis of left lower extremity Status post IV Rocephin 1 g x1.  Received 4 g IV morphine and Percocet 5-325 x1 in ED.  We will continue IV antibiotics and pain control regimen, adjust as tolerated. - Continue IV Rocephin 1 g daily, can de-escalate as appropriate to PO, expect a 7-day course (10/18 - 10/25) - Begin the following pain control regimen: PO Tylenol 1 g every 6 hours for moderate pain. PRN oxycodone 5 mg every 6 hours for severe pain. - Consult wound care for appropriate management - Encourage p.o. fluids - Daily a.m. CBC/BMP - Recheck Lactate AM 10/19 - PT/OT eval and treat as tolerated Protein-calorie malnutrition, unspecified severity (HCC) Patient has a history of PCM. Low albumin likely contributing to LLE edema. - RD consult placed, recs appreciated PAD (peripheral artery disease) (HCC) On Eliquis for PAF. Likely contributing to issues with healing LLE wounds. On left lower venous duplex ultrasound to rule out DVT: incidental finding of turbulent/monophasic flow of left popliteal artery. - Order left arterial duplex Elevated BUN BUN 45 in setting of normal Cr 1.05. Does have questionable history of CKD3 though multiple GFR measurements in the past have been normal. - Check AM BMP to follow  Chronic and Stable Conditions: Atrial fibrillation: Heart rate iregular on exam though patient stable without tachycardia, awaiting EKG.  In the meantime, continue home Eliquis 5 mg twice daily. Hyperlipidemia: Continue home Lipitor 40 mg daily. Type 2 diabetes (A1c of 6.4%): Patient was unaware that he met criteria for T2DM - most recent A1c 6.4.  Monitor glucose via a.m. BMPs, can initiate sliding scale/CBGs if concerned  for hyperglycemia.  Continue home Jardiance 10 mg daily. Chronic heart failure (LVEF of 50%): Appears euvolemic. Continue PO Lasix  40 mg twice daily, Lopressor 25 mg twice daily, spironolactone 25 mg daily, Jardiance as above. CKD stage 3: See above. Unfortunately unknown baseline. Previous GFR normal. Monitor renal function and medications that can impact renal function.  FEN/GI: Heart healthy VTE Prophylaxis: Eliquis  Disposition: Med/surg  History of Present Illness:  Mason Hill is a 82 y.o. male with a past medical history of A-fib on Eliquis, aortic root dilation, bilateral PAD c/b lymphedema, protein calorie malnutrition, vitamin D deficiency, CHF (LVEF: 50%) t/w Lasix 40mg  BID, CAD, HTN, HLD, T2DM (A1c: 6.4), and CKD stage III.    On interview, he presents with a several-day history of worsening left lower extremity pain/swelling.  Of note, patient was recently hospitalized in early September of this year for volume overload and sepsis secondary to pneumonia complicated by Streptococcus bacteremia.  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  After discharge from the hospital, he had wraps on his LLE where blisters were, but these fell off. Home Health came by for the PICC line, but he did not get much care for the wound on his foot. Has had cellulitis in the past, but this is different because it is not oozy like previously. Has had swelling and more warmth of the area. Has had bleeding from the cracked and dry skin. He ambulates on his own at baseline. He puts his legs up at night for chronic fluid. No trouble with eating, bowel movements, passing urine.  His cellulitis in the past resulted after clearing out arteries in his LLE. He reports no dizziness. No palpitations.  In the ED, pt was afebrile and bradycardic to 46 but otherwise well-appearing.  +LLE edema to mid shin. Dopplers sluggish but present bilaterally.  XR foot negative for fracture, gas.  Given: IV Rocephin 1 g x1, morphine 4 mg x1, Percocet 5-325 mg x1,  IV Zofran 4 mg x1.  WBC: 17.8.  Lactate 2.1.  BUN/Cr: 45/1.05.  Blood glucose: 136.  CK 278 WNL.  He was admitted to the [facility] Medicine Teaching Service for IV antibiotics administration and observation.  Review Of Systems: Per HPI.  Pertinent Past Medical History: Atrial fibrillation Peripheral artery disease Protein calorie malnutrition Chronic heart failure Coronary artery disease NSTEMI  Endocarditis  CAP requiring hospitalization  Hypertension Hyperlipidemia Type 2 diabetes CKD stage III Remainder reviewed in history tab.   Pertinent Past Surgical History: Cardiac cath (08/2011) TEE (10/2022) Inguinal hernia repair Remainder reviewed in history tab.   Pertinent Social History: Tobacco use: Never Alcohol use: Never Other Substance use: No Lives with grandson in [place], own meds  Pertinent Family History: Brother: T2DM Father: T2DM, MI Mother: T2DM Maternal aunt: Colon cancer Maternal uncle: HTN Remainder reviewed in history tab.   Important Outpatient Medications: Acetaminophen 1 g every 6 hours as needed for moderate pain Eliquis 5 mg twice daily Lipitor 40 mg daily Lasix 40 mg twice daily Jardiance 10 mg daily Losartan 50 mg daily Metoprolol heartrate 25 mg twice daily Spironolactone 25 mg daily Remainder reviewed in medication history.   Objective: BP (!) 117/54   Pulse (!) 114   Temp 98.1 F (36.7 C) (Oral)   Resp 17   Ht 6' (1.829 m)   Wt 87.3 kg   SpO2 100%   BMI 26.10 kg/m  Exam:  General: Lying in bed, conversant, NAD Eyes: EOM grossly intact, PERRLA ENTM: Mucous membranes moist, dentition notable for ulcerations in upper left palate Cardiovascular: Irregularly irregular. No murmur's, rubs, or gallops. Respiratory: CTAB, no W/R/R Gastrointestinal: BS+, no tenderness in 4 quardrants  MSK: Left lower extremity: 2+ pitting edema 5 cm proximal to knee, onychomycosis noted, skin flaking to mid shin, multiple scattered dry wounds (cracks)  with bleeding between great toe and second toe, sensation intact, dull pain which resolves proximally, ROM intact Right lower extremity: 2+ pitting edema to below the knee, mild onychomycosis noted, no other significant findings MSK otherwise full ROM, normal strength for age Derm: Skin otherwise intact -- lower extremity derm findings as above.  Neuro: Alert and oriented, no gross focal deficits Psych: Appropriate mood and affect    Labs:  CBC BMET  Recent Labs  Lab 12/14/22 1129  WBC 17.8*  HGB 10.9*  HCT 33.3*  PLT 246   Recent Labs  Lab 12/14/22 1540  NA 141  K 3.5  CL 103  CO2 27  BUN 45*  CREATININE 1.05  GLUCOSE 136*  CALCIUM 8.4*    Pertinent additional labs:  Lactate: 2.1 CKD: 278 WNL  EKG: Awaiting read   Imaging Studies Performed:  VAS [place] left lower extremity (10/18)  RIGHT:  - No evidence of common femoral vein obstruction.   LEFT:  - No evidence of deep vein thrombosis in the lower extremity. No indirect evidence of obstruction proximal to the inguinal ligament.    - No cystic structure found in the popliteal fossa.  - Ultrasound characteristics of enlarged lymph nodes noted in the groin.   [name], MD PGY-1, [facility] Family [facility]  FPTS Intern pager: [phone], text pages welcome Secure chat group [facility] Teaching Service   I was personally present and performed or re-performed the history, physical exam and medical decision making activities of this service and have verified that the service and findings are accurately documented in the resident's note.  Janeal Holmes, MD                  12/14/2022, 7:21 PM PGY-2, [facility] Family [facility]

## 2022-12-14 NOTE — ED Notes (Signed)
ED TO INPATIENT HANDOFF REPORT  ED Nurse Name and Phone #:  Corliss Blacker, RN [phone]  S Name/Age/Gender Mason Hill 82 y.o. male Room/Bed: 016C/016C  Code Status   Code Status: Full Code  Home/SNF/Other Home Patient oriented to: self, place, time, and situation Is this baseline? Yes   Triage Complete: Triage complete  Chief Complaint Cellulitis of left lower extremity [L03.116]  Triage Note Pt c/o left foot painx2wks. Pt denies injury. Pt has  3+ swelling of LLE. Pt has 1+ left pedal pulse, warm to touch, pt able to wiggle toes. Pt has dried flaky skin on LLE.    Allergies Allergies  Allergen Reactions   Codeine Other (See Comments) and Rash    Other Reaction: BLISTERING, PEELING.   Erythromycin    Isosorbide Nitrate     Other reaction(s): Headache    Level of Care/Admitting Diagnosis ED Disposition     ED Disposition  Admit   Condition  --   Comment  Hospital Area: [facility] [[id number]]  Level of Care: Med-Surg [16]  May admit patient to [facility] or [facility] if equivalent level of care is available:: No  Covid Evaluation: Asymptomatic - no recent exposure (last 10 days) testing not required  Diagnosis: Cellulitis of left lower extremity [130865]  Admitting Physician: [name] [[id number]]  Attending Physician: [name] [id number]  Certification:: I certify this patient will need inpatient services for at least 2 midnights  Expected Medical Readiness: 12/18/2022          B Medical/Surgery History Past Medical History:  Diagnosis Date   A-fib [facility])    AMI (acute myocardial infarction) (HCC)    Diabetes mellitus without complication (HCC)    Hypertension    Past Surgical History:  Procedure Laterality Date   HERNIA REPAIR     TEE WITHOUT CARDIOVERSION N/A 10/30/2022   Procedure: TRANSESOPHAGEAL ECHOCARDIOGRAM;  Surgeon: [name], MD;  Location: [facility] INVASIVE CV LAB;  Service: Cardiovascular;  Laterality:  N/A;     A IV Location/Drains/Wounds Patient Lines/Drains/Airways Status     Active Line/Drains/Airways     Name Placement date Placement time Site Days   Peripheral IV 12/14/22 20 G Distal;Posterior;Right Forearm 12/14/22  1150  Forearm  less than 1            Intake/Output Last 24 hours No intake or output data in the 24 hours ending 12/14/22 1740  Labs/Imaging Results for orders placed or performed during the hospital encounter of 12/14/22 (from the past 48 hour(s))  CBC with Differential     Status: Abnormal   Collection Time: 12/14/22 11:29 AM  Result Value Ref Range   WBC 17.8 (H) 4.0 - 10.5 K/uL   RBC 3.55 (L) 4.22 - 5.81 MIL/uL   Hemoglobin 10.9 (L) 13.0 - 17.0 g/dL   HCT 41.3 (L) 24.4 - 01.0 %   MCV 93.8 80.0 - 100.0 fL   MCH 30.7 26.0 - 34.0 pg   MCHC 32.7 30.0 - 36.0 g/dL   RDW 27.2 53.6 - 64.4 %   Platelets 246 150 - 400 K/uL   nRBC 0.0 0.0 - 0.2 %   Neutrophils Relative % 84 %   Neutro Abs 14.9 (H) 1.7 - 7.7 K/uL   Lymphocytes Relative 3 %   Lymphs Abs 0.6 (L) 0.7 - 4.0 K/uL   Monocytes Relative 12 %   Monocytes Absolute 2.1 (H) 0.1 - 1.0 K/uL   Eosinophils Relative 0 %   Eosinophils Absolute 0.1 0.0 -  0.5 K/uL   Basophils Relative 0 %   Basophils Absolute 0.1 0.0 - 0.1 K/uL   Immature Granulocytes 1 %   Abs Immature Granulocytes 0.15 (H) 0.00 - 0.07 K/uL    Comment: Performed at [facility], [address], [place], [place] [address]  I-Stat CG4 Lactic Acid     Status: Abnormal   Collection Time: 12/14/22 11:36 AM  Result Value Ref Range   Lactic Acid, Venous 2.1 (HH) 0.5 - 1.9 mmol/L   Comment NOTIFIED PHYSICIAN   CK     Status: None   Collection Time: 12/14/22  3:40 PM  Result Value Ref Range   Total CK 278 49 - 397 U/L    Comment: Performed at [facility], [address], [place], [place] [address]  Basic metabolic panel     Status: Abnormal   Collection Time: 12/14/22  3:40 PM  Result Value Ref Range   Sodium 141 135 - 145  mmol/L   Potassium 3.5 3.5 - 5.1 mmol/L   Chloride 103 98 - 111 mmol/L   CO2 27 22 - 32 mmol/L   Glucose, Bld 136 (H) 70 - 99 mg/dL    Comment: Glucose reference range applies only to samples taken after fasting for at least 8 hours.   BUN 45 (H) 8 - 23 mg/dL   Creatinine, Ser 0.98 0.61 - 1.24 mg/dL   Calcium 8.4 (L) 8.9 - 10.3 mg/dL   GFR, Estimated >11 >91 mL/min    Comment: (NOTE) Calculated using the CKD-EPI Creatinine Equation (2021)    Anion gap 11 5 - 15    Comment: Performed at [facility], [address] [place], [place] [address]   VAS [place] LOWER EXTREMITY VENOUS (DVT) (ONLY [facility] & [facility])  Result Date: 12/14/2022  Lower Venous DVT Study Patient Name:  Mason Hill  Date of Exam:   12/14/2022 Medical Rec #: [id number]     Accession #:    [id number] Date of Birth: Mar 08, 1940     Patient Gender: M Patient Age:   72 years Exam Location:  [facility] Procedure:      VAS [place] LOWER EXTREMITY VENOUS (DVT) Referring Phys: Ivin Booty GEIPLE --------------------------------------------------------------------------------  Other Indications: Left leg swelling and pain for several weeks. Denies SOB. Performing Technologist: Dondra Prader RVT, RCS  Examination Guidelines: A complete evaluation includes B-mode imaging, spectral Doppler, color Doppler, and power Doppler as needed of all accessible portions of each vessel. Bilateral testing is considered an integral part of a complete examination. Limited examinations for reoccurring indications may be performed as noted. The reflux portion of the exam is performed with the patient in reverse Trendelenburg.  +-----+---------------+---------+-----------+----------+--------------+ RIGHTCompressibilityPhasicitySpontaneityPropertiesThrombus Aging +-----+---------------+---------+-----------+----------+--------------+ CFV  Full           Yes      Yes                                  +-----+---------------+---------+-----------+----------+--------------+   +---------+---------------+---------+-----------+----------+--------------+ LEFT     CompressibilityPhasicitySpontaneityPropertiesThrombus Aging +---------+---------------+---------+-----------+----------+--------------+ CFV      Full           Yes      Yes                                 +---------+---------------+---------+-----------+----------+--------------+ SFJ      Full  Yes      Yes                                 +---------+---------------+---------+-----------+----------+--------------+ FV Prox  Full           Yes      Yes                                 +---------+---------------+---------+-----------+----------+--------------+ FV Mid   Full           Yes      Yes                                 +---------+---------------+---------+-----------+----------+--------------+ FV DistalFull           Yes      Yes                                 +---------+---------------+---------+-----------+----------+--------------+ PFV      Full           Yes      Yes                                 +---------+---------------+---------+-----------+----------+--------------+ POP      Full           Yes      Yes                                 +---------+---------------+---------+-----------+----------+--------------+ PTV      Full           Yes      Yes                                 +---------+---------------+---------+-----------+----------+--------------+ PERO     Full           Yes      Yes                                 +---------+---------------+---------+-----------+----------+--------------+ Incidental Finding: Left popliteal artery exhibits monophasic and turbulent flow. Suggest Lower arterial duplex.    Summary: RIGHT: - No evidence of common femoral vein obstruction.   LEFT: - No evidence of deep vein thrombosis in the lower extremity. No indirect evidence of  obstruction proximal to the inguinal ligament.  - No cystic structure found in the popliteal fossa. - Ultrasound characteristics of enlarged lymph nodes noted in the groin.  *See table(s) above for measurements and observations. Electronically signed by Sherald Hess MD on 12/14/2022 at 3:04:19 PM.    Final    DG Foot Complete Left  Result Date: 12/14/2022 CLINICAL DATA:  foot pain/swelling EXAM: LEFT TIBIA AND FIBULA - 2 VIEW; LEFT FOOT - COMPLETE 3+ VIEW COMPARISON:  None Available. FINDINGS: Left tibia and fibula: Normal alignment. No acute fracture. Patellar enthesopathy. Scattered vascular calcifications. Left foot: Normal alignment. No acute fracture. Mild degenerative changes of the first MTP joint. Calcaneal enthesopathy of the Achilles insertion. Vascular calcifications. IMPRESSION: Left tibia and  fibula, left foot: No acute osseous abnormality. Scattered degenerative changes as detailed. Atherosclerosis. Electronically Signed   By: Olive Bass M.D.   On: 12/14/2022 14:00   DG Tibia/Fibula Left  Result Date: 12/14/2022 CLINICAL DATA:  foot pain/swelling EXAM: LEFT TIBIA AND FIBULA - 2 VIEW; LEFT FOOT - COMPLETE 3+ VIEW COMPARISON:  None Available. FINDINGS: Left tibia and fibula: Normal alignment. No acute fracture. Patellar enthesopathy. Scattered vascular calcifications. Left foot: Normal alignment. No acute fracture. Mild degenerative changes of the first MTP joint. Calcaneal enthesopathy of the Achilles insertion. Vascular calcifications. IMPRESSION: Left tibia and fibula, left foot: No acute osseous abnormality. Scattered degenerative changes as detailed. Atherosclerosis. Electronically Signed   By: Olive Bass M.D.   On: 12/14/2022 14:00    Pending Labs Unresulted Labs (From admission, onward)     Start     Ordered   12/15/22 0500  CBC  Tomorrow morning,   R        12/14/22 1711   12/15/22 0500  Basic metabolic panel  Tomorrow morning,   R        12/14/22 1711             Vitals/Pain Today's Vitals   12/14/22 1230 12/14/22 1445 12/14/22 1543 12/14/22 1552  BP: (!) 106/51 (!) 117/54    Pulse: (!) 48 (!) 114    Resp: 17 17    Temp:    98.1 F (36.7 C)  TempSrc:    Oral  SpO2: 100% 100%    Weight:      Height:      PainSc:   8      Isolation Precautions No active isolations  Medications Medications  acetaminophen (TYLENOL) tablet 1,000 mg (has no administration in time range)  atorvastatin (LIPITOR) tablet 40 mg (has no administration in time range)  furosemide (LASIX) tablet 40 mg (has no administration in time range)  spironolactone (ALDACTONE) tablet 25 mg (has no administration in time range)  empagliflozin (JARDIANCE) tablet 10 mg (has no administration in time range)  apixaban (ELIQUIS) tablet 5 mg (has no administration in time range)  cefTRIAXone (ROCEPHIN) 1 g in sodium chloride 0.9 % 100 mL IVPB (has no administration in time range)  oxyCODONE-acetaminophen (PERCOCET/ROXICET) 5-325 MG per tablet 1 tablet (1 tablet Oral Given 12/14/22 1128)  cefTRIAXone (ROCEPHIN) 1 g in sodium chloride 0.9 % 100 mL IVPB (0 g Intravenous Stopped 12/14/22 1638)  morphine (PF) 4 MG/ML injection 4 mg (4 mg Intravenous Given 12/14/22 1542)  ondansetron (ZOFRAN) injection 4 mg (4 mg Intravenous Given 12/14/22 1542)    Mobility walks with device     Focused Assessments    R Recommendations: See Admitting Provider Note  Report given to:   Additional Notes:

## 2022-12-14 NOTE — ED Provider Notes (Signed)
[place] EMERGENCY DEPARTMENT AT [facility] Provider Note   CSN: [id number] Arrival date & time: 12/14/22  1011     History  Chief Complaint  Patient presents with   Foot Pain    Mason Hill is a 82 y.o. male.  Patient with h/o CKD stage III, hyperlipidemia, diabetes type 2, chronic diastolic CHF, paroxysmal A-fib on Eliquis, coronary artery disease, hypertension, hyperlipidemia, peripheral artery disease, recent admission early Sept 2024 for PNA, CHF exacerbation --presents to the emergency department today for evaluation of worsening left lower extremity pain and swelling.  States that the symptoms have worsened over the past several days.  He states that for a while, he had his legs wrapped due to swelling, and the symptoms worsened after he had these removed.  He has had some blistering noted.  No fever or chills.  States that overall the swelling, especially in his right lower extremity is much improved from where it was about a month ago.  No shortness of breath or difficulties lying down.       Home Medications Prior to Admission medications   Medication Sig Start Date End Date Taking? Authorizing Provider  apixaban (ELIQUIS) 5 MG TABS tablet Take 1 tablet (5 mg total) by mouth 2 (two) times daily. 11/03/22   Elgergawy, Leana Roe, MD  atorvastatin (LIPITOR) 40 MG tablet Take 1 tablet (40 mg total) by mouth daily. 11/04/22   Elgergawy, Leana Roe, MD  furosemide (LASIX) 40 MG tablet Take 1 tablet (40 mg total) by mouth 2 (two) times daily. 11/03/22 11/03/23  Elgergawy, Leana Roe, MD  metoprolol tartrate (LOPRESSOR) 25 MG tablet Take 1 tablet (25 mg total) by mouth 2 (two) times daily. 11/03/22   Elgergawy, Leana Roe, MD  spironolactone (ALDACTONE) 50 MG tablet Take 0.5 tablets (25 mg total) by mouth daily. 11/04/22   Elgergawy, Leana Roe, MD      Allergies    Codeine, Erythromycin, and Isosorbide nitrate    Review of Systems   Review of Systems  Physical Exam Updated Vital  Signs BP (!) 126/51   Pulse (!) 46   Temp 98.4 F (36.9 C) (Oral)   Resp 16   Ht 6' (1.829 m)   Wt 87.3 kg   SpO2 100%   BMI 26.10 kg/m  Physical Exam Vitals and nursing note reviewed.  Constitutional:      General: He is not in acute distress.    Appearance: He is well-developed.  HENT:     Head: Normocephalic and atraumatic.  Eyes:     General:        Right eye: No discharge.        Left eye: No discharge.     Conjunctiva/sclera: Conjunctivae normal.  Cardiovascular:     Rate and Rhythm: Bradycardia present. Rhythm irregularly irregular.     Pulses:          Dorsalis pedis pulses are detected w/ Doppler on the right side and detected w/ Doppler on the left side.     Heart sounds: Normal heart sounds.     Comments: Bradycardia, irr/irr pulse. Doppler sounds sluggish in left foot but DP pulse present.  Pulmonary:     Effort: Pulmonary effort is normal.     Breath sounds: Normal breath sounds.  Abdominal:     Palpations: Abdomen is soft.     Tenderness: There is no abdominal tenderness.  Musculoskeletal:     Cervical back: Normal range of motion and neck supple.  Comments: Left lower extremity: There seems to be generalized edema of the foot extending up to the mid shin.  There is mild warmth and mild tenderness in these areas.  Sole of foot does not appear to be particularly erythematous.  I do not see any wounds or ulcers.  Right lower extremity: Trace edema, skin flaking noted.  Skin:    General: Skin is warm and dry.  Neurological:     Mental Status: He is alert.              ED Results / Procedures / Treatments   Labs (all labs ordered are listed, but only abnormal results are displayed) Labs Reviewed  CBC WITH DIFFERENTIAL/PLATELET - Abnormal; Notable for the following components:      Result Value   WBC 17.8 (*)    RBC 3.55 (*)    Hemoglobin 10.9 (*)    HCT 33.3 (*)    Neutro Abs 14.9 (*)    Lymphs Abs 0.6 (*)    Monocytes Absolute 2.1 (*)     Abs Immature Granulocytes 0.15 (*)    All other components within normal limits  I-STAT CG4 LACTIC ACID, ED - Abnormal; Notable for the following components:   Lactic Acid, Venous 2.1 (*)    All other components within normal limits  BASIC METABOLIC PANEL  CK    EKG None  Radiology VAS [place] LOWER EXTREMITY VENOUS (DVT) (ONLY [facility] & [facility])  Result Date: 12/14/2022  Lower Venous DVT Study Patient Name:  Mason Hill  Date of Exam:   12/14/2022 Medical Rec #: [id number]     Accession #:    [id number] Date of Birth: 1940-12-28     Patient Gender: M Patient Age:   12 years Exam Location:  [facility] Procedure:      VAS [place] LOWER EXTREMITY VENOUS (DVT) Referring Phys: Ivin Booty Gal Smolinski --------------------------------------------------------------------------------  Other Indications: Left leg swelling and pain for several weeks. Denies SOB. Performing Technologist: Dondra Prader RVT, RCS  Examination Guidelines: A complete evaluation includes B-mode imaging, spectral Doppler, color Doppler, and power Doppler as needed of all accessible portions of each vessel. Bilateral testing is considered an integral part of a complete examination. Limited examinations for reoccurring indications may be performed as noted. The reflux portion of the exam is performed with the patient in reverse Trendelenburg.  +-----+---------------+---------+-----------+----------+--------------+ RIGHTCompressibilityPhasicitySpontaneityPropertiesThrombus Aging +-----+---------------+---------+-----------+----------+--------------+ CFV  Full           Yes      Yes                                 +-----+---------------+---------+-----------+----------+--------------+   +---------+---------------+---------+-----------+----------+--------------+ LEFT     CompressibilityPhasicitySpontaneityPropertiesThrombus Aging +---------+---------------+---------+-----------+----------+--------------+ CFV      Full           Yes       Yes                                 +---------+---------------+---------+-----------+----------+--------------+ SFJ      Full           Yes      Yes                                 +---------+---------------+---------+-----------+----------+--------------+ FV Prox  Full  Yes      Yes                                 +---------+---------------+---------+-----------+----------+--------------+ FV Mid   Full           Yes      Yes                                 +---------+---------------+---------+-----------+----------+--------------+ FV DistalFull           Yes      Yes                                 +---------+---------------+---------+-----------+----------+--------------+ PFV      Full           Yes      Yes                                 +---------+---------------+---------+-----------+----------+--------------+ POP      Full           Yes      Yes                                 +---------+---------------+---------+-----------+----------+--------------+ PTV      Full           Yes      Yes                                 +---------+---------------+---------+-----------+----------+--------------+ PERO     Full           Yes      Yes                                 +---------+---------------+---------+-----------+----------+--------------+ Incidental Finding: Left popliteal artery exhibits monophasic and turbulent flow. Suggest Lower arterial duplex.    Summary: RIGHT: - No evidence of common femoral vein obstruction.   LEFT: - No evidence of deep vein thrombosis in the lower extremity. No indirect evidence of obstruction proximal to the inguinal ligament.  - No cystic structure found in the popliteal fossa. - Ultrasound characteristics of enlarged lymph nodes noted in the groin.  *See table(s) above for measurements and observations. Electronically signed by Sherald Hess MD on 12/14/2022 at 3:04:19 PM.    Final    DG Foot Complete  Left  Result Date: 12/14/2022 CLINICAL DATA:  foot pain/swelling EXAM: LEFT TIBIA AND FIBULA - 2 VIEW; LEFT FOOT - COMPLETE 3+ VIEW COMPARISON:  None Available. FINDINGS: Left tibia and fibula: Normal alignment. No acute fracture. Patellar enthesopathy. Scattered vascular calcifications. Left foot: Normal alignment. No acute fracture. Mild degenerative changes of the first MTP joint. Calcaneal enthesopathy of the Achilles insertion. Vascular calcifications. IMPRESSION: Left tibia and fibula, left foot: No acute osseous abnormality. Scattered degenerative changes as detailed. Atherosclerosis. Electronically Signed   By: Olive Bass M.D.   On: 12/14/2022 14:00   DG Tibia/Fibula Left  Result Date: 12/14/2022 CLINICAL DATA:  foot pain/swelling EXAM: LEFT TIBIA AND FIBULA - 2 VIEW; LEFT FOOT - COMPLETE 3+  VIEW COMPARISON:  None Available. FINDINGS: Left tibia and fibula: Normal alignment. No acute fracture. Patellar enthesopathy. Scattered vascular calcifications. Left foot: Normal alignment. No acute fracture. Mild degenerative changes of the first MTP joint. Calcaneal enthesopathy of the Achilles insertion. Vascular calcifications. IMPRESSION: Left tibia and fibula, left foot: No acute osseous abnormality. Scattered degenerative changes as detailed. Atherosclerosis. Electronically Signed   By: Olive Bass M.D.   On: 12/14/2022 14:00    Procedures Procedures    Medications Ordered in ED Medications  cefTRIAXone (ROCEPHIN) 1 g in sodium chloride 0.9 % 100 mL IVPB (has no administration in time range)  morphine (PF) 4 MG/ML injection 4 mg (has no administration in time range)  ondansetron (ZOFRAN) injection 4 mg (has no administration in time range)  oxyCODONE-acetaminophen (PERCOCET/ROXICET) 5-325 MG per tablet 1 tablet (1 tablet Oral Given 12/14/22 1128)    ED Course/ Medical Decision Making/ A&P Clinical Course as of 12/14/22 1525  Fri Dec 14, 2022  1519 S. Edema LLE foot/ankle. Recent  admission CHF/PNA. DVT study negative. Pulses present. Getting IV abx for cellulitis. F/u BMP, admit [KM]    Clinical Course User Index [KM] Lyman Speller, MD    Patient seen and examined. History obtained directly from patient.  Reviewed most recent hospitalization notes.  Labs/EKG: Ordered CBC, CMP, lactate, CK.  Imaging: Ordered x-ray of the left foot and tib-fib, DVT study.  Medications/Fluids: Ordered: P.o. Percocet.   Most recent vital signs reviewed and are as follows: BP (!) 126/51   Pulse (!) 46   Temp 98.4 F (36.9 C) (Oral)   Resp 16   Ht 6' (1.829 m)   Wt 87.3 kg   SpO2 100%   BMI 26.10 kg/m   Initial impression: Left lower extremity swelling, possibly cellulitis, dopplerable pulses bilaterally, sluggish/broad pulse on the left.  Patient discussed with and seen by Dr. Wallace Cullens.  3:26 PM Reassessment performed. Patient appears stable during several rechecks.  Labs personally reviewed and interpreted including: CBC with elevated white blood cell count 17.8, hemoglobin slightly low at 10.9 otherwise unremarkable; lactate 2.1.  BMP and CK still pending.  RN looking into this.  Imaging personally visualized and interpreted including: X-ray of the foot and tib-fib are negative for fracture or soft tissue gas.  Reviewed pertinent lab work and imaging with patient at bedside. Questions answered.   Most current vital signs reviewed and are as follows: BP (!) 106/51   Pulse (!) 48   Temp 98.4 F (36.9 C) (Oral)   Resp 17   Ht 6' (1.829 m)   Wt 87.3 kg   SpO2 100%   BMI 26.10 kg/m   Plan: Awaiting BMP and CK.  Awaiting IV antibiotic initiation.  Plan for admission.  Patient and family in agreement.  I have ordered additional pain medication.  Discussed with Dr. Anitra Lauth, Dr. Ledon Snare at shift change.                                    Medical Decision Making Amount and/or Complexity of Data Reviewed Labs: ordered. Radiology: ordered.  Risk Prescription  drug management.   LLE edema, redness, swelling concerning for cellulitis.  Dopplerable pedal pulses.  DVT study negative.  X-ray does not demonstrate soft tissue gas.  Awaiting completion of workup.  Will initiate antibiotics given exam as well as elevated white blood cell count.        Final Clinical Impression(s) /  ED Diagnoses Final diagnoses:  Cellulitis of left lower extremity    Rx / DC Orders ED Discharge Orders     None         Renne Crigler, PA-C 12/14/22 1529    Sloan Leiter, DO 12/16/22 [phone]

## 2022-12-14 NOTE — Plan of Care (Signed)

## 2022-12-14 NOTE — Progress Notes (Signed)
*  PRELIMINARY RESULTS* Vascular Ultrasound Left lower venous duplex has been completed.  Preliminary findings: No deep[ or superficial thrombus.   Incidental Finding: Left popliteal artery exhibits monophasic and turbulent flow. Suggest Lower arterial duplex.  Earlie Server Laylynn Campanella 12/14/2022, 1:42 PM

## 2022-12-14 NOTE — ED Triage Notes (Signed)
Pt c/o left foot painx2wks. Pt denies injury. Pt has  3+ swelling of LLE. Pt has 1+ left pedal pulse, warm to touch, pt able to wiggle toes. Pt has dried flaky skin on LLE.

## 2022-12-14 NOTE — ED Notes (Signed)
Patient transported to vascular. 

## 2022-12-14 NOTE — Assessment & Plan Note (Addendum)
Status post IV Rocephin 1 g x1.  Received 4 g IV morphine and Percocet 5-325 x1 in ED.  We will continue IV antibiotics and pain control regimen, adjust as tolerated. - Continue IV Rocephin 1 g daily, can de-escalate as appropriate to PO, expect a 7-day course (10/18 - 10/25) - Begin the following pain control regimen: PO Tylenol 1 g every 6 hours for moderate pain. PRN oxycodone 5 mg every 6 hours for severe pain. - Consult wound care for appropriate management - Encourage p.o. fluids - Daily a.m. CBC/BMP - Recheck Lactate AM 10/19 - PT/OT eval and treat as tolerated

## 2022-12-15 ENCOUNTER — Inpatient Hospital Stay (HOSPITAL_COMMUNITY): Payer: Medicare Other

## 2022-12-15 DIAGNOSIS — R9431 Abnormal electrocardiogram [ECG] [EKG]: Secondary | ICD-10-CM | POA: Diagnosis not present

## 2022-12-15 DIAGNOSIS — I739 Peripheral vascular disease, unspecified: Secondary | ICD-10-CM | POA: Insufficient documentation

## 2022-12-15 DIAGNOSIS — E46 Unspecified protein-calorie malnutrition: Secondary | ICD-10-CM | POA: Insufficient documentation

## 2022-12-15 DIAGNOSIS — L03116 Cellulitis of left lower limb: Secondary | ICD-10-CM | POA: Diagnosis not present

## 2022-12-15 DIAGNOSIS — N179 Acute kidney failure, unspecified: Secondary | ICD-10-CM | POA: Insufficient documentation

## 2022-12-15 DIAGNOSIS — I4891 Unspecified atrial fibrillation: Secondary | ICD-10-CM

## 2022-12-15 LAB — LACTIC ACID, PLASMA
Lactic Acid, Venous: 0.9 mmol/L (ref 0.5–1.9)
Lactic Acid, Venous: 1 mmol/L (ref 0.5–1.9)

## 2022-12-15 LAB — BASIC METABOLIC PANEL
Anion gap: 15 (ref 5–15)
BUN: 47 mg/dL — ABNORMAL HIGH (ref 8–23)
CO2: 24 mmol/L (ref 22–32)
Calcium: 9.1 mg/dL (ref 8.9–10.3)
Chloride: 98 mmol/L (ref 98–111)
Creatinine, Ser: 1.5 mg/dL — ABNORMAL HIGH (ref 0.61–1.24)
GFR, Estimated: 46 mL/min — ABNORMAL LOW (ref >60)
Glucose, Bld: 151 mg/dL — ABNORMAL HIGH (ref 70–99)
Potassium: 3.9 mmol/L (ref 3.5–5.1)
Sodium: 137 mmol/L (ref 135–145)

## 2022-12-15 LAB — CBC
HCT: 31.6 % — ABNORMAL LOW (ref 39.0–52.0)
Hemoglobin: 10.2 g/dL — ABNORMAL LOW (ref 13.0–17.0)
MCH: 29.8 pg (ref 26.0–34.0)
MCHC: 32.3 g/dL (ref 30.0–36.0)
MCV: 92.4 fL (ref 80.0–100.0)
Platelets: 228 10*3/uL (ref 150–400)
RBC: 3.42 MIL/uL — ABNORMAL LOW (ref 4.22–5.81)
RDW: 15.3 % (ref 11.5–15.5)
WBC: 13.6 10*3/uL — ABNORMAL HIGH (ref 4.0–10.5)
nRBC: 0 % (ref 0.0–0.2)

## 2022-12-15 MED ORDER — HYDROCERIN EX CREA
TOPICAL_CREAM | Freq: Every day | CUTANEOUS | Status: DC
  Filled 2022-12-15: qty 113

## 2022-12-15 NOTE — Progress Notes (Signed)
Daily Progress Note Intern Pager: [phone]  Patient name: Mason Hill Medical record number: [id number] Date of birth: [birth year] Age: 82 y.o. Gender: male  Primary Care Provider: Nonda Lou, MD Consultants: none Code Status: full  Pt Overview and Major Events to Date:  10/18: admitted to FMTS  Assessment and Plan: Mason Hill is an 82 year old male who presented with several days of worsening LLE swelling and pain admitted for cellulitis. Treating with IV CTX and pain control regimen, wound care consulted. Elevated BUN with AKI this AM, likely prerenal due to poor PO intake. Assessment & Plan Cellulitis of left lower extremity Status post IV Rocephin 1 g x1. Lactic acid 2.1 on admission. Leukocytosis downtrending 17.8 > 13.6. Pain well controlled. - Continue IV Rocephin 1 g daily, can de-escalate as appropriate to PO, expect a 7-day course (10/18 - 10/25) - Pain control regimen: PO Tylenol 1 g every 6 hours for moderate pain. PRN oxycodone 5 mg every 6 hours for severe pain. - F/u wound care recommendations -- possible MRI LLE today to eval for osteomyelitis - Encourage p.o. fluids - Daily a.m. 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 Low albumin likely contributing to LLE edema. - RD consult placed, recs appreciated PAD (peripheral artery disease) (HCC) On Eliquis for PAF. Likely contributing to issues with healing LLE wounds. On left lower venous duplex ultrasound to rule out DVT: incidental finding of turbulent/monophasic flow of left popliteal artery. - Left lower arterial duplex AKI (acute kidney injury) (HCC) On admission, Cr at baseline (1.05) and BUN elevated at 45. Does have questionable history of CKD3 though multiple GFR measurements in the past have been normal. AM BMP with Cr 1.50, BUN 47, and GFR 46. Likely prerenal in the setting of poor PO intake vs  cardiorenal in pt with CHF. - continue to monitor with AM BMP -- avoid nephrotoxic medications    Chronic and Stable Issues: Atrial fibrillation: Heart rate iregular on exam though patient stable without tachycardia, awaiting EKG.  In the meantime, continue home Eliquis 5 mg twice daily. Hyperlipidemia: Continue home Lipitor 40 mg daily. Type 2 diabetes (A1c of 6.4%): Patient was unaware that he met criteria for T2DM - most recent A1c 6.4.  Monitor glucose via a.m. BMPs, can initiate sliding scale/CBGs if concerned for hyperglycemia.  Continue home Jardiance 10 mg daily. Chronic heart failure (LVEF of 50%): Appears euvolemic. Continue PO Lasix 40 mg twice daily, Lopressor 25 mg twice daily, spironolactone 25 mg daily, Jardiance as above. HTN: losartan 50mg  daily (holding due to soft pressures)  FEN/GI: heart healthy PPx: eliquis Dispo: pending clinical improvement  Subjective:  Patient states he feels good this morning, says he slept well overnight as his pain was much better controlled.  Denies pain this morning.  Also denies fevers, chills, chest pain and shortness of breath.   Objective: Temp:  [97.7 F (36.5 C)-98.8 F (37.1 C)] 98.8 F (37.1 C) (10/19 0443) Pulse Rate:  [46-114] 63 (10/18 1939) Resp:  [16-18] 16 (10/19 0443) BP: (106-141)/(51-63) 116/52 (10/19 0443) SpO2:  [93 %-100 %] 93 % (10/19 0443) Weight:  [87.3 kg] 87.3 kg (10/18 1051) Physical Exam: General: Older male sitting up in bed, pleasant and conversant, in no acute distress Cardiovascular: RRR, normal S1/S2 Respiratory: CTAB to anterior lung fields, normal WOB on RA Abdomen: Normoactive bowel sounds, soft, nontender, nondistended Extremities: LLE with pitting edema  to just below knee, warmth, skin flaking with scattered dry wounds some bleeding near medial ankle, onychomycosis present; RLE with trace edema, skin flaking  Laboratory: Most recent CBC Lab Results  Component Value Date   WBC 13.6 (H)  12/15/2022   HGB 10.2 (L) 12/15/2022   HCT 31.6 (L) 12/15/2022   MCV 92.4 12/15/2022   PLT 228 12/15/2022   Most recent BMP    Latest Ref Rng & Units 12/15/2022    4:02 AM  BMP  Glucose 70 - 99 mg/dL 147   BUN 8 - 23 mg/dL 47   Creatinine 8.29 - 1.24 mg/dL 5.62   Sodium 130 - 865 mmol/L 137   Potassium 3.5 - 5.1 mmol/L 3.9   Chloride 98 - 111 mmol/L 98   CO2 22 - 32 mmol/L 24   Calcium 8.9 - 10.3 mg/dL 9.1     Imaging/Diagnostic Tests:  EKG: possible junctional rhythm with bradycardia  Lorayne Bender, MD 12/15/2022, 7:14 AM  PGY-1, [facility] FPTS Intern pager: [phone], text pages welcome Secure chat group [facility] Teaching Service

## 2022-12-15 NOTE — Progress Notes (Signed)
PT Cancellation Note  Patient Details Name: Mamadi Ferraris MRN: [id number] DOB: 1940-09-25   Cancelled Treatment:    Reason Eval/Treat Not Completed: Fatigue/lethargy limiting ability to participate.  Pt recently worked with OT and LLE now hurting, waiting for "wound doctor".     Olivia Canter 12/15/2022, 4:08 PM

## 2022-12-15 NOTE — Assessment & Plan Note (Signed)
Patient has a history of PCM. Low albumin likely contributing to LLE edema. - RD consult placed, recs appreciated

## 2022-12-15 NOTE — Plan of Care (Signed)

## 2022-12-15 NOTE — Assessment & Plan Note (Signed)
On Eliquis for PAF. Likely contributing to issues with healing LLE wounds. On left lower venous duplex ultrasound to rule out DVT: incidental finding of turbulent/monophasic flow of left popliteal artery. - Order left arterial duplex

## 2022-12-15 NOTE — Consult Note (Addendum)
WOC Nurse Consult Note: this consult performed remotely utilizing EMR records including photo documentation; patient with history of lymphedema; currently being treated for cellulitis to L lower leg with IV antibiotics  Reason for Consult:LLE cellulitis, open cracked skin  Wound type: full thickness ulcerations likely r/t venous insufficiency  Pressure Injury POA: NA  Measurement: see nursing flowsheet  Wound bed: scattered full thickness ulcerations that appear pink and moist  Drainage (amount, consistency, odor) minimal bleeding between L great and 2nd toe Periwound: edema, dry peeling skin  Dressing procedure/placement/frequency: Clean bilateral lower legs with soap and water, apply Eucerin to intact skin both legs and feet (DO NOT PLACE BETWEEN TOES).  Left foot/leg clean any open draining wounds including between 1st and 2nd toes with NS, apply silver hydrofiber (Aquacel [place] #[id number]) to wound beds daily. After performing above, cover L lower leg with ABD pads and secure with Kerlix roll gauze beginning above toes and ending right below knee.  May place Ace bandage wrapped in same fashion as Kerlix for light compression.   X-rays L foot negative for osteomyelitis, MRI pending at this time.   POC discussed with bedside nurse. WOC team will not follow. Re-consult if further needs arise.    Thank you,    Even Budlong MSN, RN-BC, CWOCN [phone]  Conservative sharp wound debridement (CSWD performed at the bedside):

## 2022-12-15 NOTE — Assessment & Plan Note (Signed)
BUN 45 in setting of normal Cr 1.05. Does have questionable history of CKD3 though multiple GFR measurements in the past have been normal. - Check AM BMP to follow

## 2022-12-15 NOTE — Evaluation (Signed)
Occupational Therapy Evaluation Patient Details Name: Mason Hill MRN: [id number] DOB: 03-Mar-1940 Today's Date: 12/15/2022   History of Present Illness 82 y.o. male presenting with a several day history of worsening left lower extremity swelling/pain. PMH +sepsis due to pna; pericardial effusion; elevated troponins with demand ischemia; anasarca  PMH significant of essential hypertension, CKD stage IIIa, hyperlipidemia, diabetes type 2, congestive heart failure, paroxysmal atrial fibrillation on Eliquis, CAD   Clinical Impression   Pt c/o no pain at rest, mild pain with ambulation in LLE. Pt reports LLE was bleeding when standing to use urinal yesterday, wounds present on LLE, minimal spotting noticed on socks. Pt PLOF independent, lives at home with grandson who is available before/after work. Pt currently close to baseline, slight limp due to L foot pain, no LOB, good safety awareness, independent with ADLs, supervision for safety with mobility due to limp. Pt has no current acute OT needs, if status changes, will reassess needs. No follow up needed. Pt states he has all DME at home needed to remain safe and independent.       If plan is discharge home, recommend the following: Assist for transportation    Functional Status Assessment  Patient has had a recent decline in their functional status and demonstrates the ability to make significant improvements in function in a reasonable and predictable amount of time.  Equipment Recommendations  None recommended by OT    Recommendations for Other Services       Precautions / Restrictions Precautions Precautions: Fall Precaution Comments: L foot swelling/bleeding Restrictions Weight Bearing Restrictions: No      Mobility Bed Mobility Overal bed mobility: Independent                  Transfers Overall transfer level: Needs assistance   Transfers: Sit to/from Stand Sit to Stand: Supervision           General  transfer comment: supervision for safety due to L foot pain with ambulation      Balance Overall balance assessment: Mild deficits observed, not formally tested                                         ADL either performed or assessed with clinical judgement   ADL Overall ADL's : Needs assistance/impaired                                       General ADL Comments: good safety awareness, close to or at baseline, slight limp due to L foot pain, no LOB. supervision for ambulation     Vision Baseline Vision/History: 0 No visual deficits Ability to See in Adequate Light: 0 Adequate Patient Visual Report: No change from baseline       Perception         Praxis         Pertinent Vitals/Pain Pain Assessment Pain Assessment: No/denies pain     Extremity/Trunk Assessment Upper Extremity Assessment Upper Extremity Assessment: Overall WFL for tasks assessed   Lower Extremity Assessment Lower Extremity Assessment: Defer to PT evaluation       Communication Communication Communication: No apparent difficulties   Cognition Arousal: Alert Behavior During Therapy: WFL for tasks assessed/performed Overall Cognitive Status: Within Functional Limits for tasks assessed  General Comments       Exercises     Shoulder Instructions      Home Living Family/patient expects to be discharged to:: Private residence Living Arrangements: Other relatives Available Help at Discharge: Family;Available PRN/intermittently Type of Home: House Home Access: Stairs to enter [employer] of Steps: 3 Entrance Stairs-Rails: Can reach both Home Layout: One level     Bathroom Shower/Tub: Walk-in shower         Home Equipment: Agricultural consultant (2 wheels);Cane - single point;BSC/3in1   Additional Comments: Pt lives at home with great grandson who can assist before/after work.      Prior  Functioning/Environment Prior Level of Function : Independent/Modified Independent                        OT Problem List: Pain;Increased edema;Impaired balance (sitting and/or standing)      OT Treatment/Interventions: Self-care/ADL training;Therapeutic activities    OT Goals(Current goals can be found in the care plan section) Acute Rehab OT Goals Patient Stated Goal: to manage pain in LLE OT Goal Formulation: With patient Time For Goal Achievement: 12/29/22 Potential to Achieve Goals: Good  OT Frequency: Min 1X/week    Co-evaluation              AM-PAC OT "6 Clicks" Daily Activity     Outcome Measure Help from another person eating meals?: None Help from another person taking care of personal grooming?: None Help from another person toileting, which includes using toliet, bedpan, or urinal?: A Little Help from another person bathing (including washing, rinsing, drying)?: A Little Help from another person to put on and taking off regular upper body clothing?: None Help from another person to put on and taking off regular lower body clothing?: A Little 6 Click Score: 21   End of Session Equipment Utilized During Treatment: Gait belt Nurse Communication: Mobility status  Activity Tolerance: Patient tolerated treatment well Patient left: in bed;with call bell/phone within reach  OT Visit Diagnosis: Pain;Other abnormalities of gait and mobility (R26.89) Pain - Right/Left: Left Pain - part of body: Ankle and joints of foot                Time: 5784-6962 OT Time Calculation (min): 26 min Charges:  OT General Charges $OT Visit: 1 Visit OT Evaluation $OT Eval Low Complexity: 1 Low OT Treatments $Self Care/Home Management : 8-22 mins  [place], OTR/L   Alexis Goodell 12/15/2022, 1:39 PM

## 2022-12-15 NOTE — Assessment & Plan Note (Addendum)
Status post IV Rocephin 1 g x1. Lactic acid 2.1 on admission. Leukocytosis downtrending 17.8 > 13.6. Pain well controlled. - Continue IV Rocephin 1 g daily, can de-escalate as appropriate to PO, expect a 7-day course (10/18 - 10/25) - Pain control regimen: PO Tylenol 1 g every 6 hours for moderate pain. PRN oxycodone 5 mg every 6 hours for severe pain. - F/u wound care recommendations -- possible MRI LLE today to eval for osteomyelitis - Encourage p.o. fluids - Daily a.m. CBC/BMP - f/u repeat lactic acid - PT/OT eval and treat as tolerated

## 2022-12-15 NOTE — Assessment & Plan Note (Signed)
On Eliquis for PAF. Likely contributing to issues with healing LLE wounds. On left lower venous duplex ultrasound to rule out DVT: incidental finding of turbulent/monophasic flow of left popliteal artery. - Left lower arterial duplex

## 2022-12-15 NOTE — Assessment & Plan Note (Addendum)
On admission, Cr at baseline (1.05) and BUN elevated at 45. Does have questionable history of CKD3 though multiple GFR measurements in the past have been normal. AM BMP with Cr 1.50, BUN 47, and GFR 46. Likely prerenal in the setting of poor PO intake vs cardiorenal in pt with CHF. - continue to monitor with AM BMP -- avoid nephrotoxic medications

## 2022-12-15 NOTE — Consult Note (Signed)
E'Vel.= nm* cm/s  E/E'Ratio= nm*   Tricuspid: MODERATE TR            No TS             2.9 m/s peak TR vel   49 mmHg peak RV pressure   Pulmonary: MILD PR                No PS     ECHO: 10/26/2022  1. Left ventricular ejection fraction, by estimation, is 60 to 65%. Left  ventricular ejection fraction by PLAX is 62 %. The left ventricle has  normal function. The left ventricle has no regional wall motion  abnormalities. Left  ventricular diastolic  parameters are indeterminate. There is the interventricular septum is  flattened in systole, consistent with right ventricular pressure overload.   2. Right ventricular systolic function is severely reduced. The right  ventricular size is moderately enlarged. There is mildly elevated  pulmonary artery systolic pressure. The estimated right ventricular  systolic pressure is 42.7 mmHg.   3. Right atrial size was severely dilated.   4. Pericardial fluid measures 1.36 cm over the RV apex and up to 2.29 cm  around the right atrium.. Moderate pericardial effusion. The pericardial  effusion is anterior to the right ventricle.   5. The mitral valve is degenerative. Trivial mitral valve regurgitation.   6. The aortic valve is tricuspid. Aortic valve regurgitation is not  visualized.   7. Aortic dilatation noted. There is borderline dilatation of the aortic  root, measuring 39 mm.   8. The inferior vena cava is dilated in size with <50% respiratory  variability, suggesting right atrial pressure of 15 mmHg.   Laboratory Data:  High Sensitivity Troponin:  No results for input(s): "TROPONINIHS" in the last 720 hours.   Chemistry Recent Labs  Lab 12/14/22 1540 12/15/22 0402  NA 141 137  K 3.5 3.9  CL 103 98  CO2 27 24  GLUCOSE 136* 151*  BUN 45* 47*  CREATININE 1.05 1.50*  CALCIUM 8.4* 9.1  GFRNONAA >60 46*  ANIONGAP 11 15    Lab Results  Component Value Date   ALT 24 11/03/2022   AST 31 11/03/2022   ALKPHOS 96 11/03/2022   BILITOT 0.4 11/03/2022    Lipids No results for input(s): "CHOL", "TRIG", "HDL", "LABVLDL", "LDLCALC", "CHOLHDL" in the last 168 hours.  Hematology Recent Labs  Lab 12/14/22 1129 12/15/22 0402  WBC 17.8* 13.6*  RBC 3.55* 3.42*  HGB 10.9* 10.2*  HCT 33.3* 31.6*  MCV 93.8 92.4  MCH 30.7 29.8  MCHC 32.7 32.3  RDW 14.9 15.3  PLT 246 228   Thyroid No results for input(s): "TSH", "FREET4" in the last 168 hours.  BNPNo results for  input(s): "BNP", "PROBNP" in the last 168 hours.  DDimer No results for input(s): "DDIMER" in the last 168 hours.   Radiology/Studies:  VAS [place] LOWER EXTREMITY VENOUS (DVT) (ONLY [facility])  Result Date: 12/14/2022  Lower Venous DVT Study Patient Name:  Mason Hill  Date of Exam:   12/14/2022 Medical Rec #: [id number]     Accession #:    [id number] Date of Birth: 1940/11/01     Patient Gender: M Patient Age:   82 years Exam Location:  [facility] Procedure:      VAS [place] LOWER EXTREMITY VENOUS (DVT) Referring Phys: Ivin Booty GEIPLE --------------------------------------------------------------------------------  Other Indications: Left leg swelling and pain for several weeks. Denies SOB. Performing Technologist: Dondra Prader RVT, RCS  E'Vel.= nm* cm/s  E/E'Ratio= nm*   Tricuspid: MODERATE TR            No TS             2.9 m/s peak TR vel   49 mmHg peak RV pressure   Pulmonary: MILD PR                No PS     ECHO: 10/26/2022  1. Left ventricular ejection fraction, by estimation, is 60 to 65%. Left  ventricular ejection fraction by PLAX is 62 %. The left ventricle has  normal function. The left ventricle has no regional wall motion  abnormalities. Left  ventricular diastolic  parameters are indeterminate. There is the interventricular septum is  flattened in systole, consistent with right ventricular pressure overload.   2. Right ventricular systolic function is severely reduced. The right  ventricular size is moderately enlarged. There is mildly elevated  pulmonary artery systolic pressure. The estimated right ventricular  systolic pressure is 42.7 mmHg.   3. Right atrial size was severely dilated.   4. Pericardial fluid measures 1.36 cm over the RV apex and up to 2.29 cm  around the right atrium.. Moderate pericardial effusion. The pericardial  effusion is anterior to the right ventricle.   5. The mitral valve is degenerative. Trivial mitral valve regurgitation.   6. The aortic valve is tricuspid. Aortic valve regurgitation is not  visualized.   7. Aortic dilatation noted. There is borderline dilatation of the aortic  root, measuring 39 mm.   8. The inferior vena cava is dilated in size with <50% respiratory  variability, suggesting right atrial pressure of 15 mmHg.   Laboratory Data:  High Sensitivity Troponin:  No results for input(s): "TROPONINIHS" in the last 720 hours.   Chemistry Recent Labs  Lab 12/14/22 1540 12/15/22 0402  NA 141 137  K 3.5 3.9  CL 103 98  CO2 27 24  GLUCOSE 136* 151*  BUN 45* 47*  CREATININE 1.05 1.50*  CALCIUM 8.4* 9.1  GFRNONAA >60 46*  ANIONGAP 11 15    Lab Results  Component Value Date   ALT 24 11/03/2022   AST 31 11/03/2022   ALKPHOS 96 11/03/2022   BILITOT 0.4 11/03/2022    Lipids No results for input(s): "CHOL", "TRIG", "HDL", "LABVLDL", "LDLCALC", "CHOLHDL" in the last 168 hours.  Hematology Recent Labs  Lab 12/14/22 1129 12/15/22 0402  WBC 17.8* 13.6*  RBC 3.55* 3.42*  HGB 10.9* 10.2*  HCT 33.3* 31.6*  MCV 93.8 92.4  MCH 30.7 29.8  MCHC 32.7 32.3  RDW 14.9 15.3  PLT 246 228   Thyroid No results for input(s): "TSH", "FREET4" in the last 168 hours.  BNPNo results for  input(s): "BNP", "PROBNP" in the last 168 hours.  DDimer No results for input(s): "DDIMER" in the last 168 hours.   Radiology/Studies:  VAS [place] LOWER EXTREMITY VENOUS (DVT) (ONLY [facility])  Result Date: 12/14/2022  Lower Venous DVT Study Patient Name:  Mason Hill  Date of Exam:   12/14/2022 Medical Rec #: [id number]     Accession #:    [id number] Date of Birth: 1940/11/01     Patient Gender: M Patient Age:   82 years Exam Location:  [facility] Procedure:      VAS [place] LOWER EXTREMITY VENOUS (DVT) Referring Phys: Ivin Booty GEIPLE --------------------------------------------------------------------------------  Other Indications: Left leg swelling and pain for several weeks. Denies SOB. Performing Technologist: Dondra Prader RVT, RCS  Cardiology Consultation   Patient ID: Mason Hill MRN: [id number]; DOB: 27-Sep-1940  Admit date: 12/14/2022 Date of Consult: 12/15/2022  PCP:  Nonda Lou, MD   [facility] Providers Cardiologist:  Christell Constant, MD      [facility]  Patient Profile:   Mason Hill is a 82 y.o. male with a hx of paroxysmal atrial fibrillation, paroxysmal junctional rhythm (declined PPM 2020), CAD , chronic diastolic heart failure, NSVT, hypertension, dyslipidemia, PAD required intervention, type 2 diabetes,  mod peric effusion 10/25/2022 w/o tamponade, who is being seen 12/15/2022 for the evaluation of bradycardia at the request of Dr Pollie Meyer.  History of Present Illness:   Mr. Leff follows [facility] health system cardiology.  He has known CAD, suffered an NSTEMI 08/2011, LHC revealed insignificant CAD except 100% occluded small OM 2.  He was medically managed.   He has paroxysmal atrial fibrillation and is on anticoagulation Eliquis.  Holter monitor 12/07/2020 revealed PVC burden 9.1%, NSVT, permanent A-fib.  Repeat monitor later revealed A-fib burden 31%.  He had a paroxysmal junctional rhythm in the past, beta-blocker had been stopped, declined PPM 2020.    He had several admission for acute diastolic heart failure in the past, it was felt PVC and atrial fibrillation contributing for decompensation, PPM was discussed again but he had not agreed.  Echo completed on 06/21/2022 revealing LVEF > 55% mild LVH, mild RV systolic dysfunction, mildly dilated PA, moderately enlarged RV and RA, trivial pericardial effusion, no significant valvular disease.  He was last seen in the office 07/05/2022, was instructed to take Lasix 40 mg twice daily, continue losartan 100 mg daily, amlodipine 2.5 mg daily, spironolactone 50 mg daily, Jardiance 10 mg daily, atorvastatin 40 mg daily, and Eliquis for his CAD/diastolic heart failure/atrial fibrillation   He was seen by cardiology here at  the end of August and early September for a pericardial effusion.  The effusion improved with diuresis resulting in a 30 pound weight loss and pericardiocentesis was needed.  He was seen at [facility] on 09/24, weight was 185 pounds.  He was to resume losartan 50 mg daily and Jardiance 10 mg daily.  An echo was ordered.  And later performed, it showed RV function slightly decreased and no pericardial effusion.  He came to the hospital on 10/18 for worsening left lower extremity swelling and pain.  He was diagnosed with cellulitis.  He was bradycardic and cardiology was asked to evaluate him.  Mr. Bernheisel was completely unaware that there was anything wrong from a heart standpoint.  He felt that his breathing was at baseline.  He feels his weight has been stable.  His blood pressure has been pretty reasonable, a little high at times.  He has no palpitations, has no awareness of atrial fibrillation and does not know when he is in it.  His heart rate when he checks his blood pressure has been generally normal, but he remembers 1 reading of 44.  He has not been lightheaded or dizzy, he has not had presyncope or syncope.  He has not had lower extremity edema, no orthopnea or PND.  Dyspnea on exertion has been at baseline.  The only reason he came to the hospital was because of his left leg, he has been diagnosed with cellulitis and was on antibiotics.  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  He completed  E'Vel.= nm* cm/s  E/E'Ratio= nm*   Tricuspid: MODERATE TR            No TS             2.9 m/s peak TR vel   49 mmHg peak RV pressure   Pulmonary: MILD PR                No PS     ECHO: 10/26/2022  1. Left ventricular ejection fraction, by estimation, is 60 to 65%. Left  ventricular ejection fraction by PLAX is 62 %. The left ventricle has  normal function. The left ventricle has no regional wall motion  abnormalities. Left  ventricular diastolic  parameters are indeterminate. There is the interventricular septum is  flattened in systole, consistent with right ventricular pressure overload.   2. Right ventricular systolic function is severely reduced. The right  ventricular size is moderately enlarged. There is mildly elevated  pulmonary artery systolic pressure. The estimated right ventricular  systolic pressure is 42.7 mmHg.   3. Right atrial size was severely dilated.   4. Pericardial fluid measures 1.36 cm over the RV apex and up to 2.29 cm  around the right atrium.. Moderate pericardial effusion. The pericardial  effusion is anterior to the right ventricle.   5. The mitral valve is degenerative. Trivial mitral valve regurgitation.   6. The aortic valve is tricuspid. Aortic valve regurgitation is not  visualized.   7. Aortic dilatation noted. There is borderline dilatation of the aortic  root, measuring 39 mm.   8. The inferior vena cava is dilated in size with <50% respiratory  variability, suggesting right atrial pressure of 15 mmHg.   Laboratory Data:  High Sensitivity Troponin:  No results for input(s): "TROPONINIHS" in the last 720 hours.   Chemistry Recent Labs  Lab 12/14/22 1540 12/15/22 0402  NA 141 137  K 3.5 3.9  CL 103 98  CO2 27 24  GLUCOSE 136* 151*  BUN 45* 47*  CREATININE 1.05 1.50*  CALCIUM 8.4* 9.1  GFRNONAA >60 46*  ANIONGAP 11 15    Lab Results  Component Value Date   ALT 24 11/03/2022   AST 31 11/03/2022   ALKPHOS 96 11/03/2022   BILITOT 0.4 11/03/2022    Lipids No results for input(s): "CHOL", "TRIG", "HDL", "LABVLDL", "LDLCALC", "CHOLHDL" in the last 168 hours.  Hematology Recent Labs  Lab 12/14/22 1129 12/15/22 0402  WBC 17.8* 13.6*  RBC 3.55* 3.42*  HGB 10.9* 10.2*  HCT 33.3* 31.6*  MCV 93.8 92.4  MCH 30.7 29.8  MCHC 32.7 32.3  RDW 14.9 15.3  PLT 246 228   Thyroid No results for input(s): "TSH", "FREET4" in the last 168 hours.  BNPNo results for  input(s): "BNP", "PROBNP" in the last 168 hours.  DDimer No results for input(s): "DDIMER" in the last 168 hours.   Radiology/Studies:  VAS [place] LOWER EXTREMITY VENOUS (DVT) (ONLY [facility])  Result Date: 12/14/2022  Lower Venous DVT Study Patient Name:  Mason Hill  Date of Exam:   12/14/2022 Medical Rec #: [id number]     Accession #:    [id number] Date of Birth: 1940/11/01     Patient Gender: M Patient Age:   82 years Exam Location:  [facility] Procedure:      VAS [place] LOWER EXTREMITY VENOUS (DVT) Referring Phys: Ivin Booty GEIPLE --------------------------------------------------------------------------------  Other Indications: Left leg swelling and pain for several weeks. Denies SOB. Performing Technologist: Dondra Prader RVT, RCS  E'Vel.= nm* cm/s  E/E'Ratio= nm*   Tricuspid: MODERATE TR            No TS             2.9 m/s peak TR vel   49 mmHg peak RV pressure   Pulmonary: MILD PR                No PS     ECHO: 10/26/2022  1. Left ventricular ejection fraction, by estimation, is 60 to 65%. Left  ventricular ejection fraction by PLAX is 62 %. The left ventricle has  normal function. The left ventricle has no regional wall motion  abnormalities. Left  ventricular diastolic  parameters are indeterminate. There is the interventricular septum is  flattened in systole, consistent with right ventricular pressure overload.   2. Right ventricular systolic function is severely reduced. The right  ventricular size is moderately enlarged. There is mildly elevated  pulmonary artery systolic pressure. The estimated right ventricular  systolic pressure is 42.7 mmHg.   3. Right atrial size was severely dilated.   4. Pericardial fluid measures 1.36 cm over the RV apex and up to 2.29 cm  around the right atrium.. Moderate pericardial effusion. The pericardial  effusion is anterior to the right ventricle.   5. The mitral valve is degenerative. Trivial mitral valve regurgitation.   6. The aortic valve is tricuspid. Aortic valve regurgitation is not  visualized.   7. Aortic dilatation noted. There is borderline dilatation of the aortic  root, measuring 39 mm.   8. The inferior vena cava is dilated in size with <50% respiratory  variability, suggesting right atrial pressure of 15 mmHg.   Laboratory Data:  High Sensitivity Troponin:  No results for input(s): "TROPONINIHS" in the last 720 hours.   Chemistry Recent Labs  Lab 12/14/22 1540 12/15/22 0402  NA 141 137  K 3.5 3.9  CL 103 98  CO2 27 24  GLUCOSE 136* 151*  BUN 45* 47*  CREATININE 1.05 1.50*  CALCIUM 8.4* 9.1  GFRNONAA >60 46*  ANIONGAP 11 15    Lab Results  Component Value Date   ALT 24 11/03/2022   AST 31 11/03/2022   ALKPHOS 96 11/03/2022   BILITOT 0.4 11/03/2022    Lipids No results for input(s): "CHOL", "TRIG", "HDL", "LABVLDL", "LDLCALC", "CHOLHDL" in the last 168 hours.  Hematology Recent Labs  Lab 12/14/22 1129 12/15/22 0402  WBC 17.8* 13.6*  RBC 3.55* 3.42*  HGB 10.9* 10.2*  HCT 33.3* 31.6*  MCV 93.8 92.4  MCH 30.7 29.8  MCHC 32.7 32.3  RDW 14.9 15.3  PLT 246 228   Thyroid No results for input(s): "TSH", "FREET4" in the last 168 hours.  BNPNo results for  input(s): "BNP", "PROBNP" in the last 168 hours.  DDimer No results for input(s): "DDIMER" in the last 168 hours.   Radiology/Studies:  VAS [place] LOWER EXTREMITY VENOUS (DVT) (ONLY [facility])  Result Date: 12/14/2022  Lower Venous DVT Study Patient Name:  Mason Hill  Date of Exam:   12/14/2022 Medical Rec #: [id number]     Accession #:    [id number] Date of Birth: 1940/11/01     Patient Gender: M Patient Age:   82 years Exam Location:  [facility] Procedure:      VAS [place] LOWER EXTREMITY VENOUS (DVT) Referring Phys: Ivin Booty GEIPLE --------------------------------------------------------------------------------  Other Indications: Left leg swelling and pain for several weeks. Denies SOB. Performing Technologist: Dondra Prader RVT, RCS  Cardiology Consultation   Patient ID: Mason Hill MRN: [id number]; DOB: 27-Sep-1940  Admit date: 12/14/2022 Date of Consult: 12/15/2022  PCP:  Nonda Lou, MD   [facility] Providers Cardiologist:  Christell Constant, MD      [facility]  Patient Profile:   Mason Hill is a 82 y.o. male with a hx of paroxysmal atrial fibrillation, paroxysmal junctional rhythm (declined PPM 2020), CAD , chronic diastolic heart failure, NSVT, hypertension, dyslipidemia, PAD required intervention, type 2 diabetes,  mod peric effusion 10/25/2022 w/o tamponade, who is being seen 12/15/2022 for the evaluation of bradycardia at the request of Dr Pollie Meyer.  History of Present Illness:   Mr. Leff follows [facility] health system cardiology.  He has known CAD, suffered an NSTEMI 08/2011, LHC revealed insignificant CAD except 100% occluded small OM 2.  He was medically managed.   He has paroxysmal atrial fibrillation and is on anticoagulation Eliquis.  Holter monitor 12/07/2020 revealed PVC burden 9.1%, NSVT, permanent A-fib.  Repeat monitor later revealed A-fib burden 31%.  He had a paroxysmal junctional rhythm in the past, beta-blocker had been stopped, declined PPM 2020.    He had several admission for acute diastolic heart failure in the past, it was felt PVC and atrial fibrillation contributing for decompensation, PPM was discussed again but he had not agreed.  Echo completed on 06/21/2022 revealing LVEF > 55% mild LVH, mild RV systolic dysfunction, mildly dilated PA, moderately enlarged RV and RA, trivial pericardial effusion, no significant valvular disease.  He was last seen in the office 07/05/2022, was instructed to take Lasix 40 mg twice daily, continue losartan 100 mg daily, amlodipine 2.5 mg daily, spironolactone 50 mg daily, Jardiance 10 mg daily, atorvastatin 40 mg daily, and Eliquis for his CAD/diastolic heart failure/atrial fibrillation   He was seen by cardiology here at  the end of August and early September for a pericardial effusion.  The effusion improved with diuresis resulting in a 30 pound weight loss and pericardiocentesis was needed.  He was seen at [facility] on 09/24, weight was 185 pounds.  He was to resume losartan 50 mg daily and Jardiance 10 mg daily.  An echo was ordered.  And later performed, it showed RV function slightly decreased and no pericardial effusion.  He came to the hospital on 10/18 for worsening left lower extremity swelling and pain.  He was diagnosed with cellulitis.  He was bradycardic and cardiology was asked to evaluate him.  Mr. Bernheisel was completely unaware that there was anything wrong from a heart standpoint.  He felt that his breathing was at baseline.  He feels his weight has been stable.  His blood pressure has been pretty reasonable, a little high at times.  He has no palpitations, has no awareness of atrial fibrillation and does not know when he is in it.  His heart rate when he checks his blood pressure has been generally normal, but he remembers 1 reading of 44.  He has not been lightheaded or dizzy, he has not had presyncope or syncope.  He has not had lower extremity edema, no orthopnea or PND.  Dyspnea on exertion has been at baseline.  The only reason he came to the hospital was because of his left leg, he has been diagnosed with cellulitis and was on antibiotics.  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  He completed  E'Vel.= nm* cm/s  E/E'Ratio= nm*   Tricuspid: MODERATE TR            No TS             2.9 m/s peak TR vel   49 mmHg peak RV pressure   Pulmonary: MILD PR                No PS     ECHO: 10/26/2022  1. Left ventricular ejection fraction, by estimation, is 60 to 65%. Left  ventricular ejection fraction by PLAX is 62 %. The left ventricle has  normal function. The left ventricle has no regional wall motion  abnormalities. Left  ventricular diastolic  parameters are indeterminate. There is the interventricular septum is  flattened in systole, consistent with right ventricular pressure overload.   2. Right ventricular systolic function is severely reduced. The right  ventricular size is moderately enlarged. There is mildly elevated  pulmonary artery systolic pressure. The estimated right ventricular  systolic pressure is 42.7 mmHg.   3. Right atrial size was severely dilated.   4. Pericardial fluid measures 1.36 cm over the RV apex and up to 2.29 cm  around the right atrium.. Moderate pericardial effusion. The pericardial  effusion is anterior to the right ventricle.   5. The mitral valve is degenerative. Trivial mitral valve regurgitation.   6. The aortic valve is tricuspid. Aortic valve regurgitation is not  visualized.   7. Aortic dilatation noted. There is borderline dilatation of the aortic  root, measuring 39 mm.   8. The inferior vena cava is dilated in size with <50% respiratory  variability, suggesting right atrial pressure of 15 mmHg.   Laboratory Data:  High Sensitivity Troponin:  No results for input(s): "TROPONINIHS" in the last 720 hours.   Chemistry Recent Labs  Lab 12/14/22 1540 12/15/22 0402  NA 141 137  K 3.5 3.9  CL 103 98  CO2 27 24  GLUCOSE 136* 151*  BUN 45* 47*  CREATININE 1.05 1.50*  CALCIUM 8.4* 9.1  GFRNONAA >60 46*  ANIONGAP 11 15    Lab Results  Component Value Date   ALT 24 11/03/2022   AST 31 11/03/2022   ALKPHOS 96 11/03/2022   BILITOT 0.4 11/03/2022    Lipids No results for input(s): "CHOL", "TRIG", "HDL", "LABVLDL", "LDLCALC", "CHOLHDL" in the last 168 hours.  Hematology Recent Labs  Lab 12/14/22 1129 12/15/22 0402  WBC 17.8* 13.6*  RBC 3.55* 3.42*  HGB 10.9* 10.2*  HCT 33.3* 31.6*  MCV 93.8 92.4  MCH 30.7 29.8  MCHC 32.7 32.3  RDW 14.9 15.3  PLT 246 228   Thyroid No results for input(s): "TSH", "FREET4" in the last 168 hours.  BNPNo results for  input(s): "BNP", "PROBNP" in the last 168 hours.  DDimer No results for input(s): "DDIMER" in the last 168 hours.   Radiology/Studies:  VAS [place] LOWER EXTREMITY VENOUS (DVT) (ONLY [facility])  Result Date: 12/14/2022  Lower Venous DVT Study Patient Name:  Mason Hill  Date of Exam:   12/14/2022 Medical Rec #: [id number]     Accession #:    [id number] Date of Birth: 1940/11/01     Patient Gender: M Patient Age:   82 years Exam Location:  [facility] Procedure:      VAS [place] LOWER EXTREMITY VENOUS (DVT) Referring Phys: Ivin Booty GEIPLE --------------------------------------------------------------------------------  Other Indications: Left leg swelling and pain for several weeks. Denies SOB. Performing Technologist: Dondra Prader RVT, RCS  E'Vel.= nm* cm/s  E/E'Ratio= nm*   Tricuspid: MODERATE TR            No TS             2.9 m/s peak TR vel   49 mmHg peak RV pressure   Pulmonary: MILD PR                No PS     ECHO: 10/26/2022  1. Left ventricular ejection fraction, by estimation, is 60 to 65%. Left  ventricular ejection fraction by PLAX is 62 %. The left ventricle has  normal function. The left ventricle has no regional wall motion  abnormalities. Left  ventricular diastolic  parameters are indeterminate. There is the interventricular septum is  flattened in systole, consistent with right ventricular pressure overload.   2. Right ventricular systolic function is severely reduced. The right  ventricular size is moderately enlarged. There is mildly elevated  pulmonary artery systolic pressure. The estimated right ventricular  systolic pressure is 42.7 mmHg.   3. Right atrial size was severely dilated.   4. Pericardial fluid measures 1.36 cm over the RV apex and up to 2.29 cm  around the right atrium.. Moderate pericardial effusion. The pericardial  effusion is anterior to the right ventricle.   5. The mitral valve is degenerative. Trivial mitral valve regurgitation.   6. The aortic valve is tricuspid. Aortic valve regurgitation is not  visualized.   7. Aortic dilatation noted. There is borderline dilatation of the aortic  root, measuring 39 mm.   8. The inferior vena cava is dilated in size with <50% respiratory  variability, suggesting right atrial pressure of 15 mmHg.   Laboratory Data:  High Sensitivity Troponin:  No results for input(s): "TROPONINIHS" in the last 720 hours.   Chemistry Recent Labs  Lab 12/14/22 1540 12/15/22 0402  NA 141 137  K 3.5 3.9  CL 103 98  CO2 27 24  GLUCOSE 136* 151*  BUN 45* 47*  CREATININE 1.05 1.50*  CALCIUM 8.4* 9.1  GFRNONAA >60 46*  ANIONGAP 11 15    Lab Results  Component Value Date   ALT 24 11/03/2022   AST 31 11/03/2022   ALKPHOS 96 11/03/2022   BILITOT 0.4 11/03/2022    Lipids No results for input(s): "CHOL", "TRIG", "HDL", "LABVLDL", "LDLCALC", "CHOLHDL" in the last 168 hours.  Hematology Recent Labs  Lab 12/14/22 1129 12/15/22 0402  WBC 17.8* 13.6*  RBC 3.55* 3.42*  HGB 10.9* 10.2*  HCT 33.3* 31.6*  MCV 93.8 92.4  MCH 30.7 29.8  MCHC 32.7 32.3  RDW 14.9 15.3  PLT 246 228   Thyroid No results for input(s): "TSH", "FREET4" in the last 168 hours.  BNPNo results for  input(s): "BNP", "PROBNP" in the last 168 hours.  DDimer No results for input(s): "DDIMER" in the last 168 hours.   Radiology/Studies:  VAS [place] LOWER EXTREMITY VENOUS (DVT) (ONLY [facility] & [facility])  Result Date: 12/14/2022  Lower Venous DVT Study Patient Name:  Mason Hill  Date of Exam:   12/14/2022 Medical Rec #: [id number]     Accession #:    [id number] Date of Birth: 1940/11/01     Patient Gender: M Patient Age:   82 years Exam Location:  [facility] Procedure:      VAS [place] LOWER EXTREMITY VENOUS (DVT) Referring Phys: Ivin Booty GEIPLE --------------------------------------------------------------------------------  Other Indications: Left leg swelling and pain for several weeks. Denies SOB. Performing Technologist: Dondra Prader RVT, RCS  Cardiology Consultation   Patient ID: Mason Hill MRN: [id number]; DOB: 27-Sep-1940  Admit date: 12/14/2022 Date of Consult: 12/15/2022  PCP:  Nonda Lou, MD   [facility] Providers Cardiologist:  Christell Constant, MD      [facility]  Patient Profile:   Mason Hill is a 82 y.o. male with a hx of paroxysmal atrial fibrillation, paroxysmal junctional rhythm (declined PPM 2020), CAD , chronic diastolic heart failure, NSVT, hypertension, dyslipidemia, PAD required intervention, type 2 diabetes,  mod peric effusion 10/25/2022 w/o tamponade, who is being seen 12/15/2022 for the evaluation of bradycardia at the request of Dr Pollie Meyer.  History of Present Illness:   Mr. Leff follows [facility] health system cardiology.  He has known CAD, suffered an NSTEMI 08/2011, LHC revealed insignificant CAD except 100% occluded small OM 2.  He was medically managed.   He has paroxysmal atrial fibrillation and is on anticoagulation Eliquis.  Holter monitor 12/07/2020 revealed PVC burden 9.1%, NSVT, permanent A-fib.  Repeat monitor later revealed A-fib burden 31%.  He had a paroxysmal junctional rhythm in the past, beta-blocker had been stopped, declined PPM 2020.    He had several admission for acute diastolic heart failure in the past, it was felt PVC and atrial fibrillation contributing for decompensation, PPM was discussed again but he had not agreed.  Echo completed on 06/21/2022 revealing LVEF > 55% mild LVH, mild RV systolic dysfunction, mildly dilated PA, moderately enlarged RV and RA, trivial pericardial effusion, no significant valvular disease.  He was last seen in the office 07/05/2022, was instructed to take Lasix 40 mg twice daily, continue losartan 100 mg daily, amlodipine 2.5 mg daily, spironolactone 50 mg daily, Jardiance 10 mg daily, atorvastatin 40 mg daily, and Eliquis for his CAD/diastolic heart failure/atrial fibrillation   He was seen by cardiology here at  the end of August and early September for a pericardial effusion.  The effusion improved with diuresis resulting in a 30 pound weight loss and pericardiocentesis was needed.  He was seen at [facility] on 09/24, weight was 185 pounds.  He was to resume losartan 50 mg daily and Jardiance 10 mg daily.  An echo was ordered.  And later performed, it showed RV function slightly decreased and no pericardial effusion.  He came to the hospital on 10/18 for worsening left lower extremity swelling and pain.  He was diagnosed with cellulitis.  He was bradycardic and cardiology was asked to evaluate him.  Mr. Bernheisel was completely unaware that there was anything wrong from a heart standpoint.  He felt that his breathing was at baseline.  He feels his weight has been stable.  His blood pressure has been pretty reasonable, a little high at times.  He has no palpitations, has no awareness of atrial fibrillation and does not know when he is in it.  His heart rate when he checks his blood pressure has been generally normal, but he remembers 1 reading of 44.  He has not been lightheaded or dizzy, he has not had presyncope or syncope.  He has not had lower extremity edema, no orthopnea or PND.  Dyspnea on exertion has been at baseline.  The only reason he came to the hospital was because of his left leg, he has been diagnosed with cellulitis and was on antibiotics.  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  He completed

## 2022-12-16 ENCOUNTER — Inpatient Hospital Stay (HOSPITAL_COMMUNITY): Payer: Medicare Other

## 2022-12-16 DIAGNOSIS — I2510 Atherosclerotic heart disease of native coronary artery without angina pectoris: Secondary | ICD-10-CM

## 2022-12-16 DIAGNOSIS — L03116 Cellulitis of left lower limb: Secondary | ICD-10-CM | POA: Diagnosis not present

## 2022-12-16 DIAGNOSIS — I4891 Unspecified atrial fibrillation: Secondary | ICD-10-CM | POA: Diagnosis not present

## 2022-12-16 DIAGNOSIS — I4729 Other ventricular tachycardia: Secondary | ICD-10-CM | POA: Diagnosis not present

## 2022-12-16 DIAGNOSIS — I493 Ventricular premature depolarization: Secondary | ICD-10-CM

## 2022-12-16 DIAGNOSIS — N179 Acute kidney failure, unspecified: Secondary | ICD-10-CM

## 2022-12-16 DIAGNOSIS — I3139 Other pericardial effusion (noninflammatory): Secondary | ICD-10-CM

## 2022-12-16 DIAGNOSIS — I498 Other specified cardiac arrhythmias: Secondary | ICD-10-CM

## 2022-12-16 DIAGNOSIS — I739 Peripheral vascular disease, unspecified: Secondary | ICD-10-CM

## 2022-12-16 LAB — CBC
HCT: 33.3 % — ABNORMAL LOW (ref 39.0–52.0)
Hemoglobin: 10.9 g/dL — ABNORMAL LOW (ref 13.0–17.0)
MCH: 30.1 pg (ref 26.0–34.0)
MCHC: 32.7 g/dL (ref 30.0–36.0)
MCV: 92 fL (ref 80.0–100.0)
Platelets: 242 10*3/uL (ref 150–400)
RBC: 3.62 MIL/uL — ABNORMAL LOW (ref 4.22–5.81)
RDW: 14.8 % (ref 11.5–15.5)
WBC: 8.8 10*3/uL (ref 4.0–10.5)
nRBC: 0 % (ref 0.0–0.2)

## 2022-12-16 LAB — BASIC METABOLIC PANEL
Anion gap: 10 (ref 5–15)
BUN: 49 mg/dL — ABNORMAL HIGH (ref 8–23)
CO2: 26 mmol/L (ref 22–32)
Calcium: 8.2 mg/dL — ABNORMAL LOW (ref 8.9–10.3)
Chloride: 100 mmol/L (ref 98–111)
Creatinine, Ser: 1.5 mg/dL — ABNORMAL HIGH (ref 0.61–1.24)
GFR, Estimated: 46 mL/min — ABNORMAL LOW (ref >60)
Glucose, Bld: 177 mg/dL — ABNORMAL HIGH (ref 70–99)
Potassium: 3.7 mmol/L (ref 3.5–5.1)
Sodium: 136 mmol/L (ref 135–145)

## 2022-12-16 LAB — ECHOCARDIOGRAM LIMITED
Height: 72 in
Weight: 3079.39 [oz_av]

## 2022-12-16 MED ORDER — PROSOURCE PLUS PO LIQD
30.0000 mL | Freq: Two times a day (BID) | ORAL | Status: DC
  Administered 2022-12-16 – 2022-12-19 (×7): 30 mL via ORAL
  Filled 2022-12-16 (×7): qty 30

## 2022-12-16 MED ORDER — APIXABAN 2.5 MG PO TABS
2.5000 mg | ORAL_TABLET | Freq: Two times a day (BID) | ORAL | Status: DC
  Administered 2022-12-16 – 2022-12-17 (×2): 2.5 mg via ORAL
  Filled 2022-12-16 (×2): qty 1

## 2022-12-16 MED ORDER — ADULT MULTIVITAMIN W/MINERALS CH
1.0000 | ORAL_TABLET | Freq: Every day | ORAL | Status: DC
  Administered 2022-12-16 – 2022-12-19 (×4): 1 via ORAL
  Filled 2022-12-16 (×4): qty 1

## 2022-12-16 MED ORDER — SIMETHICONE 80 MG PO CHEW
80.0000 mg | CHEWABLE_TABLET | Freq: Four times a day (QID) | ORAL | Status: DC | PRN
  Administered 2022-12-16: 80 mg via ORAL
  Filled 2022-12-16: qty 1

## 2022-12-16 NOTE — Progress Notes (Signed)
Initial Nutrition Assessment  DOCUMENTATION CODES:   Not applicable  INTERVENTION:  Prosource 30  ml BID Liberalized diet MVM   NUTRITION DIAGNOSIS:   Increased nutrient needs related to wound healing as evidenced by estimated needs.    GOAL:   Patient will meet greater than or equal to 90% of their needs    MONITOR:   PO intake, Supplement acceptance, Weight trends, Labs  REASON FOR ASSESSMENT:   Consult Wound healing, Assessment of nutrition requirement/status  ASSESSMENT:  82 y.o. M, Chief complaint L ankle swelling. Admitted for cellulitis LLE. Pmhx; CKD III, HLD, DM-2, CHF,A-Fib, CAD, HTN, CHF, Malnutrition. Review of EMR revealed good appetite with no weight changes. Pt is able to feed self with no chewing or swallowing problems noted. Wound care RN following. With reports of negative for osteomyelitis. Full thickness ulcerations suspected related to venous insufficiency.  There is no benefit to excessive dietary restrictions related to advanced age and increased nutrient needs. Pt would benefit from liberalized diet to help meet increased needs.   Admit weight: 87.3 kg Current weight: 87.3kg  Weight history: 12/14/22 87.3 kg  11/03/22 87.3 kg      Average Meal Intake:  100% intake x 3 recorded meals  Nutritionally Relevant Medications: Scheduled Meds:  apixaban  5 mg Oral BID   empagliflozin  10 mg Oral Daily   furosemide  40 mg Oral BID   spironolactone  25 mg Oral Daily    Continuous Infusions:  cefTRIAXone (ROCEPHIN)  IV 200 mL/hr at 12/15/22 1655     Labs Reviewed:  HgbA1c 6.0    NUTRITION - FOCUSED PHYSICAL EXAM:  Deferred remote review  Diet Order:   Diet Order             Diet regular Room service appropriate? Yes; Fluid consistency: Thin  Diet effective now                   EDUCATION NEEDS:   No education needs have been identified at this time  Skin:  Skin Assessment: Reviewed RN Assessment  Last BM:   10/18  Height:   Ht Readings from Last 1 Encounters:  12/14/22 6' (1.829 m)    Weight:   Wt Readings from Last 1 Encounters:  12/14/22 87.3 kg    Ideal Body Weight:     BMI:  Body mass index is 26.1 kg/m.  Estimated Nutritional Needs:   Kcal:  2200-2600 kcal/d  Protein:  90-113 g/d  Fluid:  57ml/kcal    Jamelle Haring RDN, LDN Clinical Dietitian  RDN pager # available on Amion

## 2022-12-16 NOTE — Assessment & Plan Note (Addendum)
Left lower arterial duplex still pending.  Continue with Eliquis.

## 2022-12-16 NOTE — Progress Notes (Signed)
Echocardiogram 2D Echocardiogram has been performed.  Warren Lacy Vencent Hauschild RDCS 12/16/2022, 11:54 AM

## 2022-12-16 NOTE — Assessment & Plan Note (Addendum)
Leg looks visually improved on exam today.  We will continue with antibiotic treatment and current pain control regimen.  -Continue IV Rocephin 1 g daily -Continue scheduled Tylenol and as needed oxycodone -Wound care consulted, appreciate recommendations -MRI left foot to evaluate for bony involvement

## 2022-12-16 NOTE — Assessment & Plan Note (Signed)
Patient has a history of PCM. Low albumin likely contributing to LLE edema. - RD consult placed, recs appreciated

## 2022-12-16 NOTE — Progress Notes (Signed)
Daily Progress Note Intern Pager: [phone]  Patient name: Mason Hill Medical record number: [id number] Date of birth: May 26, 1940 Age: 82 y.o. Gender: male  Primary Care Provider: Nonda Lou, MD Consultants: Cardiology Code Status: Full  Pt Overview and Major Events to Date:  10/18: Admitted  Assessment and Plan:  Mason Hill is an 82 yo male who presented with days of worsening LLE swelling and pain mated for cellulitis.  He is being treated with IV ceftriaxone and pain control regimen with regular evaluation by wound care.  He has a mild to moderate pericardial effusion without evidence of tamponade, cardiology is following. Assessment & Plan Cellulitis of left lower extremity Leg looks visually improved on exam today.  We will continue with antibiotic treatment and current pain control regimen.  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 Low albumin likely contributing to LLE edema. - RD consult placed, recs appreciated PAD (peripheral artery disease) (HCC) Left lower arterial duplex still pending.  Continue with Eliquis. AKI (acute kidney injury) (HCC) CR today 1.50 (baseline 1.05), BUN 49, GFR unchanged.  - continue to monitor with AM BMP -- avoid nephrotoxic medications  Chronic diastolic congestive heart failure [facility]) Cardiology has been consulted for relation of small pericardial effusion seen incidentally on chest x-ray.  Patient has a history of chronic diastolic congestive heart failure which is well-known. -Echocardiogram -Cardiology consulted, appreciate recommendations -Recommend outpatient referrals to [facility] cardiology and electrophysiology as patient previously was following with [facility] but would like local care -Crackles on exam today, may  consider increasing Lasix if needed   Chronic and Stable Issues: A-fib: EKG shows junctional rhythm, continue Eliquis, avoid AV nodal agents cardiology HLD: Continue home Lipitor 40 mg T2DM: Continue home Jardiance Hypertension: Losartan 50 mg, holding due to soft pressures  FEN/GI: Heart healthy PPx: Eliquis Dispo:Home pending clinical improvement .   Subjective:  Resting comfortably in bed this morning, only complaint is a little bit of a tickle in his throat that improves when he coughs or clears his throat.  Denies any pain this morning.  Objective: Temp:  [97.7 F (36.5 C)-98.4 F (36.9 C)] 98.2 F (36.8 C) (10/20 1243) Pulse Rate:  [36-42] 36 (10/20 1243) Resp:  [16-17] 17 (10/20 0520) BP: (99-130)/(47-77) 130/77 (10/20 0829) SpO2:  [95 %-100 %] 100 % (10/20 1243) Physical Exam: General: Alert, oriented.  No apparent distress Cardiovascular: RRR, no M/R/G Respiratory: Mild expiratory crackling throughout, no increased work of breathing Abdomen: Flat, soft, nontender Extremities: Left lower extremity with pitting edema to just below the knee, reduced warmth compared to previous exams.  RLE with trace edema.  BLE with skin flaking  Laboratory: Most recent CBC Lab Results  Component Value Date   WBC 8.8 12/16/2022   HGB 10.9 (L) 12/16/2022   HCT 33.3 (L) 12/16/2022   MCV 92.0 12/16/2022   PLT 242 12/16/2022   Most recent BMP    Latest Ref Rng & Units 12/16/2022    9:54 AM  BMP  Glucose 70 - 99 mg/dL 932   BUN 8 - 23 mg/dL 49   Creatinine 3.55 - 1.24 mg/dL 7.32   Sodium 202 - 542 mmol/L 136   Potassium 3.5 - 5.1 mmol/L 3.7   Chloride 98 - 111 mmol/L 100   CO2 22 - 32 mmol/L 26   Calcium  8.9 - 10.3 mg/dL 8.2     Imaging/Diagnostic Tests:  Limited echo: IMPRESSIONS     1. Limited Echo to evaluate for pericardial effusion.   2. Left ventricular ejection fraction, by estimation, is 60 to 65%. The  left ventricle has normal function. The left ventricle  has no regional  wall motion abnormalities. Left ventricular diastolic function could not  be evaluated.   3. Right ventricular systolic function is mildly reduced. The right  ventricular size is moderately enlarged. There is moderately elevated  pulmonary artery systolic pressure. The estimated right ventricular  systolic pressure is 52.0 mmHg.   4. Small pericardial effusion along the lateral wall of LV. Moderate  pericardial effusion along RA and RV. There is no evidence of cardiac  tamponade.   5. The inferior vena cava is dilated in size with <50% respiratory  variability, suggesting right atrial pressure of 15 mmHg.   Gerrit Heck, DO 12/16/2022, 1:04 PM  PGY-1, [facility] Family Medicine FPTS Intern pager: [phone], text pages welcome Secure chat group [facility] Teaching Service

## 2022-12-16 NOTE — Hospital Course (Signed)
Mason Hill is a 82 y.o. male who was admitted to the [facility] Medicine Teaching Service at [facility] for LLE Cellulitis. Hospital course is outlined below by problem.   Left Lower Extremity Cellulitis Patient presented to the ED with several day history of worsening swelling and pain in the LLE. Preliminary imaging and labs were unremarkable, and suggestive of soft tissue infection. Patient was started on IV antibiotics and admitted to FMTS. He improved daily with continued antibiotics, and MRI showed no evidence of bony involvement or focal consolidation suggestive of abscess. Wound care was consulted for appropriate dressing management. On discharge he was tolerating oral antibiotics. He will complete a 6 day course of Cefadroxil outpatient.  Asymptomatic Bradycardia Patient was bradycardic to the 40s on admission and through out stay into the 30s at times. Cardiology was consulted and felt he was not a true bradycardia, but more likely a low ventricular response a-fib. EKG showed no evidence of first degree heart block, but did show paroxysmal junctional escape rhythm. AV-node blocking agents were held during admission. On discharge he was stable with heart rates in the 40s.  Chronic diastolic congestive heart failure Patient had small pericardial effusion on imaging, but was asymptomatic through out admission. Cardiology recommended outpatient follow up with [facility] and EP, and to continue Jardiance, furosemide, and spironolactone. ACE/ARB was held due to low blood pressures in the hospital. On discharge patient was stable and instructed to restart medications after seeing outpatient primary care.   Issues for follow up: Follow outpatient with cardiology/EP for potential pacemaker  Restart losartan, lopressor as appropriate Follow up with [facility] for PAD

## 2022-12-16 NOTE — Progress Notes (Signed)
Progress Note  Patient Name: Mason Hill Date of Encounter: 12/16/2022  Primary Cardiologist: Christell Constant, MD  Subjective   No acute events overnight, no symptoms.  On antibiotics for left lower EXTR cellulitis.  Telemetry reviewed, has NSR, paroxysmal junctional rhythm, A-fib, PVCs, NSVT.  Not on rate controlling agents.  HR ranging between 37 and 48 at rest.  Inpatient Medications    Scheduled Meds:  (feeding supplement) PROSource Plus  30 mL Oral BID BM   acetaminophen  1,000 mg Oral Q6H   apixaban  5 mg Oral BID   atorvastatin  40 mg Oral Daily   empagliflozin  10 mg Oral Daily   furosemide  40 mg Oral BID   hydrocerin   Topical Daily   multivitamin with minerals  1 tablet Oral Daily   spironolactone  25 mg Oral Daily   Continuous Infusions:  cefTRIAXone (ROCEPHIN)  IV 200 mL/hr at 12/15/22 1655   PRN Meds: oxyCODONE, simethicone   Vital Signs    Vitals:   12/15/22 1930 12/15/22 2116 12/16/22 0520 12/16/22 0829  BP: (!) 99/47 (!) 112/52 122/61 130/77  Pulse: (!) 40 (!) 39 (!) 41 (!) 41  Resp: 16 17 17    Temp: 98.4 F (36.9 C)  98 F (36.7 C) 97.7 F (36.5 C)  TempSrc: Oral  Oral Oral  SpO2: 95%  95% 97%  Weight:      Height:        Intake/Output Summary (Last 24 hours) at 12/16/2022 0923 Last data filed at 12/16/2022 0500 Gross per 24 hour  Intake 544.21 ml  Output 1500 ml  Net -955.79 ml   Filed Weights   12/14/22 1051  Weight: 87.3 kg    Telemetry     Personally reviewed.  Paroxysmal junctional rhythm and atrial fibrillation.  ECG    Not performed today  Physical Exam   GEN: No acute distress.   Neck: No JVD. Cardiac: RRR, no murmur, rub, or gallop.  Respiratory: Nonlabored. Clear to auscultation bilaterally. GI: Soft, nontender, bowel sounds present. MS: No edema; No deformity. Neuro:  Nonfocal. Psych: Alert and oriented x 3. Normal affect.  Labs    Chemistry Recent Labs  Lab 12/14/22 1540 12/15/22 0402  NA  141 137  K 3.5 3.9  CL 103 98  CO2 27 24  GLUCOSE 136* 151*  BUN 45* 47*  CREATININE 1.05 1.50*  CALCIUM 8.4* 9.1  GFRNONAA >60 46*  ANIONGAP 11 15     Hematology Recent Labs  Lab 12/14/22 1129 12/15/22 0402  WBC 17.8* 13.6*  RBC 3.55* 3.42*  HGB 10.9* 10.2*  HCT 33.3* 31.6*  MCV 93.8 92.4  MCH 30.7 29.8  MCHC 32.7 32.3  RDW 14.9 15.3  PLT 246 228    Cardiac EnzymesNo results for input(s): "TROPONINIHS" in the last 720 hours.  BNPNo results for input(s): "BNP", "PROBNP" in the last 168 hours.   DDimerNo results for input(s): "DDIMER" in the last 168 hours.   Radiology    DG Chest 2 View  Result Date: 12/15/2022 CLINICAL DATA:  CHF. EXAM: CHEST - 2 VIEW COMPARISON:  Radiograph 10/31/2022 FINDINGS: Prominent cardiomegaly. Slight globular configuration of the cardiopericardial silhouette. There is trace fluid in the fissures with small subpulmonic effusions. Vascular congestion. No confluent airspace disease. No pneumothorax. IMPRESSION: 1. Cardiomegaly with vascular congestion and small subpulmonic effusions. 2. Slight globular configuration of the cardiopericardial silhouette, may be due to cardiomegaly or pericardial effusion. Electronically Signed   By: Narda Rutherford  M.D.   On: 12/15/2022 21:57   VAS [place] LOWER EXTREMITY VENOUS (DVT) (ONLY [facility] & [facility])  Result Date: 12/14/2022  Lower Venous DVT Study Patient Name:  GIOVANNIE KETO  Date of Exam:   12/14/2022 Medical Rec #: [id number]     Accession #:    [id number] Date of Birth: 1940/07/30     Patient Gender: M Patient Age:   64 years Exam Location:  [facility] Procedure:      VAS [place] LOWER EXTREMITY VENOUS (DVT) Referring Phys: Ivin Booty GEIPLE --------------------------------------------------------------------------------  Other Indications: Left leg swelling and pain for several weeks. Denies SOB. Performing Technologist: Dondra Prader RVT, RCS  Examination Guidelines: A complete evaluation includes B-mode imaging,  spectral Doppler, color Doppler, and power Doppler as needed of all accessible portions of each vessel. Bilateral testing is considered an integral part of a complete examination. Limited examinations for reoccurring indications may be performed as noted. The reflux portion of the exam is performed with the patient in reverse Trendelenburg.  +-----+---------------+---------+-----------+----------+--------------+ RIGHTCompressibilityPhasicitySpontaneityPropertiesThrombus Aging +-----+---------------+---------+-----------+----------+--------------+ CFV  Full           Yes      Yes                                 +-----+---------------+---------+-----------+----------+--------------+   +---------+---------------+---------+-----------+----------+--------------+ LEFT     CompressibilityPhasicitySpontaneityPropertiesThrombus Aging +---------+---------------+---------+-----------+----------+--------------+ CFV      Full           Yes      Yes                                 +---------+---------------+---------+-----------+----------+--------------+ SFJ      Full           Yes      Yes                                 +---------+---------------+---------+-----------+----------+--------------+ FV Prox  Full           Yes      Yes                                 +---------+---------------+---------+-----------+----------+--------------+ FV Mid   Full           Yes      Yes                                 +---------+---------------+---------+-----------+----------+--------------+ FV DistalFull           Yes      Yes                                 +---------+---------------+---------+-----------+----------+--------------+ PFV      Full           Yes      Yes                                 +---------+---------------+---------+-----------+----------+--------------+ POP      Full           Yes      Yes                                  +---------+---------------+---------+-----------+----------+--------------+  PTV      Full           Yes      Yes                                 +---------+---------------+---------+-----------+----------+--------------+ PERO     Full           Yes      Yes                                 +---------+---------------+---------+-----------+----------+--------------+ Incidental Finding: Left popliteal artery exhibits monophasic and turbulent flow. Suggest Lower arterial duplex.    Summary: RIGHT: - No evidence of common femoral vein obstruction.   LEFT: - No evidence of deep vein thrombosis in the lower extremity. No indirect evidence of obstruction proximal to the inguinal ligament.  - No cystic structure found in the popliteal fossa. - Ultrasound characteristics of enlarged lymph nodes noted in the groin.  *See table(s) above for measurements and observations. Electronically signed by Sherald Hess MD on 12/14/2022 at 3:04:19 PM.    Final    DG Foot Complete Left  Result Date: 12/14/2022 CLINICAL DATA:  foot pain/swelling EXAM: LEFT TIBIA AND FIBULA - 2 VIEW; LEFT FOOT - COMPLETE 3+ VIEW COMPARISON:  None Available. FINDINGS: Left tibia and fibula: Normal alignment. No acute fracture. Patellar enthesopathy. Scattered vascular calcifications. Left foot: Normal alignment. No acute fracture. Mild degenerative changes of the first MTP joint. Calcaneal enthesopathy of the Achilles insertion. Vascular calcifications. IMPRESSION: Left tibia and fibula, left foot: No acute osseous abnormality. Scattered degenerative changes as detailed. Atherosclerosis. Electronically Signed   By: Olive Bass M.D.   On: 12/14/2022 14:00   DG Tibia/Fibula Left  Result Date: 12/14/2022 CLINICAL DATA:  foot pain/swelling EXAM: LEFT TIBIA AND FIBULA - 2 VIEW; LEFT FOOT - COMPLETE 3+ VIEW COMPARISON:  None Available. FINDINGS: Left tibia and fibula: Normal alignment. No acute fracture. Patellar enthesopathy.  Scattered vascular calcifications. Left foot: Normal alignment. No acute fracture. Mild degenerative changes of the first MTP joint. Calcaneal enthesopathy of the Achilles insertion. Vascular calcifications. IMPRESSION: Left tibia and fibula, left foot: No acute osseous abnormality. Scattered degenerative changes as detailed. Atherosclerosis. Electronically Signed   By: Olive Bass M.D.   On: 12/14/2022 14:00     Assessment & Plan    Paroxysmal atrial fibrillation with slow ventricular response Paroxysmal junctional rhythm Frequent PVCs, NSVT Left lower extremity cellulitis AKI, labs pending Nonobstructive CAD PAD with intervention Moderate pericardial effusion in 09/2022 that resolved on subsequent echo in 10/2022 Moderate RV systolic dysfunction Type 2 diabetes mellitus   -Presented with left lower extremity cellulitis, currently on IV antibiotics.  Cardiology consulted for HR in 30s, asymptomatic.  Follows with [facility] cardiology, declined PPM in 2020. Telemetry reviewed, has paroxysmal A-fib with slow ventricular response, paroxysmal junctional rhythm, NSR, frequent PVC and NSVT.  He would benefit from rate controlling agents for NSVT however he has A-fib with slow ventricular response and paroxysmal junctional rhythm. Would avoid AV nodal agents for now. No indication of PPM in the acute setting due to current management of left lower EXTR cellulitis.  Increase in infection risk.  Will monitor. Limited echocardiogram is ordered due to low voltage QRS complexes on the EKG, evaluate for any pericardial effusion (resolved on the previous echocardiogram). He used to work at [employer], now retired, hence  was following with [facility] cardiology, interested to switch care locally, to [facility] health. Please place referrals to cardiology and electrophysiology (will benefit from [facility] due to low infection risk compared to the traditional PPM and initiation of rate controlling agents thereafter). -Continue home  medications, p.o. Lasix 40 mg twice daily, Jardiance 10 mg once daily losartan 50 mg once daily, spironolactone 25 mg once daily, Eliquis 5 mg twice daily and atorvastatin 40 mg nightly.  For AKI, labs pending today, if serum creatinine uptrending, will need to hold nephrotoxic agents like Lasix, Jardiance, losartan, spironolactone. -Type II DM 2 management per primary team.      Signed, Marjo Bicker, MD  12/16/2022, 9:23 AM

## 2022-12-16 NOTE — Plan of Care (Signed)

## 2022-12-16 NOTE — Assessment & Plan Note (Addendum)
CR today 1.50 (baseline 1.05), BUN 49, GFR unchanged.  - continue to monitor with AM BMP -- avoid nephrotoxic medications

## 2022-12-16 NOTE — Evaluation (Signed)
Physical Therapy Evaluation and Discharge Patient Details Name: Mason Hill MRN: [id number] DOB: 1941-01-15 Today's Date: 12/16/2022  History of Present Illness  82 y.o. male presenting with a several day history of worsening left lower extremity swelling/pain. PMH +sepsis due to pna; pericardial effusion; elevated troponins with demand ischemia; anasarca  PMH significant of essential hypertension, CKD stage IIIa, hyperlipidemia, diabetes type 2, congestive heart failure, paroxysmal atrial fibrillation on Eliquis, CAD  Clinical Impression   Patient evaluated by Physical Therapy with no further acute PT needs identified. All education has been completed and the patient has no further questions. Overall moving well, and is close to his baseline; I'm curious if his current wounds qualify him for Outpt Wound Center follow up;  See below for any follow-up Physical Therapy or equipment needs. PT is signing off. Thank you for this referral.  Will ask Mobility Specialists to work with pt while he is here inpatient.         If plan is discharge home, recommend the following: Assistance with cooking/housework   Can travel by private vehicle    YES    Equipment Recommendations None recommended by PT  Recommendations for Other Services  Other (comment) (Mobility Team)    Functional Status Assessment Patient has not had a recent decline in their functional status     Precautions / Restrictions Precautions Precautions: Fall Precaution Comments: Fall risk is present, but minimal Restrictions Weight Bearing Restrictions: No      Mobility  Bed Mobility               General bed mobility comments: in recliner upon arrival; noted independent getting up to EOB with OT    Transfers Overall transfer level: Modified independent Equipment used: None Transfers: Sit to/from Stand Sit to Stand: Modified independent (Device/Increase time)           General transfer comment: No  difficulty noted    Ambulation/Gait Ambulation/Gait assistance: Modified independent (Device/Increase time) Gait Distance (Feet): 300 Feet Assistive device: None, Rolling walker (2 wheels) Gait Pattern/deviations: Step-through pattern       General Gait Details: Tendign to lean R during L swing, to help with L foot clearance in setting of L foot/ankle swelling and decr ROM; minimal discomfort LLE in stance; Briefly practiced how to unweight LLE in stance with RW if he ever needs to because of pain, but ultimately RW was not needed, and pt's gait pattern did not change that much with, and without RW  Stairs            Wheelchair Mobility     Tilt Bed    Modified Rankin (Stroke Patients Only)       Balance Overall balance assessment: Mild deficits observed, not formally tested                                           Pertinent Vitals/Pain Pain Assessment Pain Assessment: Faces Faces Pain Scale: Hurts a little bit Pain Location: at ares of skin opening on L dorsum of foot Pain Descriptors / Indicators: Discomfort Pain Intervention(s): Monitored during session, Other (comment) (redressed for protection during amb with gauze and kerlix)    Home Living Family/patient expects to be discharged to:: Private residence Living Arrangements: Other relatives Available Help at Discharge: Family;Available PRN/intermittently Type of Home: House Home Access: Stairs to enter Entrance Stairs-Rails: Can reach both Entrance Stairs-Number of  Steps: 3   Home Layout: One level Home Equipment: Rolling Walker (2 wheels);Cane - single point;BSC/3in1 Additional Comments: Pt lives at home with great grandson who can assist before/after work; lots of family have brought over freezer meals    Prior Function Prior Level of Function : Independent/Modified Independent                     Extremity/Trunk Assessment   Upper Extremity Assessment Upper Extremity  Assessment: Defer to OT evaluation    Lower Extremity Assessment Lower Extremity Assessment: RLE deficits/detail RLE Deficits / Details: Incr edema lower leg and foot, as well as erythema; a few areas of skin opening in first webspace and dorsum of foot mid ankle; abel to actively fex and extend toes, dorsifelx/planatrflex ankle, though wih decr ROM, limited by discomfort at skin tears; Aceepts weight onto RLE freely with ambulation       Communication   Communication Communication: No apparent difficulties;Other (comment) (Occasionally difficult to understand)  Cognition Arousal: Alert Behavior During Therapy: WFL for tasks assessed/performed Overall Cognitive Status: Within Functional Limits for tasks assessed                                          General Comments General comments (skin integrity, edema, etc.): Pt's HR ranged between 36bpm (lowest observed at rest) and got up to 81bpm with ambulation; Opted to re-wrap pt's L foot and ankle with gauze and kerlix before walking in the hallway    Exercises     Assessment/Plan    PT Assessment All further PT needs can be met in the next venue of care  PT Problem List Decreased skin integrity;Decreased knowledge of use of DME       PT Treatment Interventions      PT Goals (Current goals can be found in the Care Plan section)  Acute Rehab PT Goals Patient Stated Goal: To get infection treated PT Goal Formulation: All assessment and education complete, DC therapy    Frequency       Co-evaluation               AM-PAC PT "6 Clicks" Mobility  Outcome Measure Help needed turning from your back to your side while in a flat bed without using bedrails?: None Help needed moving from lying on your back to sitting on the side of a flat bed without using bedrails?: None Help needed moving to and from a bed to a chair (including a wheelchair)?: None Help needed standing up from a chair using your arms (e.g.,  wheelchair or bedside chair)?: None Help needed to walk in hospital room?: None Help needed climbing 3-5 steps with a railing? : None 6 Click Score: 24    End of Session Equipment Utilized During Treatment: Gait belt Activity Tolerance: Patient tolerated treatment well Patient left: in chair;with call bell/phone within reach Nurse Communication: Mobility status PT Visit Diagnosis: Other abnormalities of gait and mobility (R26.89)    Time: 1610-9604 PT Time Calculation (min) (ACUTE ONLY): 32 min   Charges:   PT Evaluation $PT Eval Low Complexity: 1 Low PT Treatments $Gait Training: 8-22 mins PT General Charges $$ ACUTE PT VISIT: 1 Visit         Van Clines, PT  Acute Rehabilitation Services Office [phone] Secure Chat welcomed   Levi Aland 12/16/2022, 2:22 PM

## 2022-12-16 NOTE — Assessment & Plan Note (Signed)
Cardiology has been consulted for relation of small pericardial effusion seen incidentally on chest x-ray.  Patient has a history of chronic diastolic congestive heart failure which is well-known. -Echocardiogram -Cardiology consulted, appreciate recommendations -Recommend outpatient referrals to [facility] cardiology and electrophysiology as patient previously was following with [facility] but would like local care -Crackles on exam today, may consider increasing Lasix if needed

## 2022-12-17 ENCOUNTER — Inpatient Hospital Stay (HOSPITAL_COMMUNITY): Payer: Medicare Other

## 2022-12-17 DIAGNOSIS — R001 Bradycardia, unspecified: Secondary | ICD-10-CM | POA: Diagnosis not present

## 2022-12-17 DIAGNOSIS — I5032 Chronic diastolic (congestive) heart failure: Secondary | ICD-10-CM | POA: Diagnosis not present

## 2022-12-17 DIAGNOSIS — I4729 Other ventricular tachycardia: Secondary | ICD-10-CM

## 2022-12-17 DIAGNOSIS — I4821 Permanent atrial fibrillation: Secondary | ICD-10-CM | POA: Diagnosis not present

## 2022-12-17 DIAGNOSIS — L03116 Cellulitis of left lower limb: Secondary | ICD-10-CM | POA: Diagnosis not present

## 2022-12-17 LAB — CBC
HCT: 36.6 % — ABNORMAL LOW (ref 39.0–52.0)
Hemoglobin: 11.7 g/dL — ABNORMAL LOW (ref 13.0–17.0)
MCH: 29.7 pg (ref 26.0–34.0)
MCHC: 32 g/dL (ref 30.0–36.0)
MCV: 92.9 fL (ref 80.0–100.0)
Platelets: 279 10*3/uL (ref 150–400)
RBC: 3.94 MIL/uL — ABNORMAL LOW (ref 4.22–5.81)
RDW: 14.5 % (ref 11.5–15.5)
WBC: 7.3 10*3/uL (ref 4.0–10.5)
nRBC: 0 % (ref 0.0–0.2)

## 2022-12-17 LAB — BASIC METABOLIC PANEL
Anion gap: 8 (ref 5–15)
BUN: 43 mg/dL — ABNORMAL HIGH (ref 8–23)
CO2: 29 mmol/L (ref 22–32)
Calcium: 8.4 mg/dL — ABNORMAL LOW (ref 8.9–10.3)
Chloride: 103 mmol/L (ref 98–111)
Creatinine, Ser: 1.26 mg/dL — ABNORMAL HIGH (ref 0.61–1.24)
GFR, Estimated: 57 mL/min — ABNORMAL LOW (ref >60)
Glucose, Bld: 129 mg/dL — ABNORMAL HIGH (ref 70–99)
Potassium: 3.6 mmol/L (ref 3.5–5.1)
Sodium: 140 mmol/L (ref 135–145)

## 2022-12-17 MED ORDER — POTASSIUM CHLORIDE CRYS ER 20 MEQ PO TBCR
20.0000 meq | EXTENDED_RELEASE_TABLET | Freq: Once | ORAL | Status: AC
  Administered 2022-12-17: 20 meq via ORAL
  Filled 2022-12-17: qty 1

## 2022-12-17 MED ORDER — APIXABAN 5 MG PO TABS
5.0000 mg | ORAL_TABLET | Freq: Two times a day (BID) | ORAL | Status: DC
  Administered 2022-12-17 – 2022-12-19 (×4): 5 mg via ORAL
  Filled 2022-12-17 (×4): qty 1

## 2022-12-17 NOTE — Progress Notes (Deleted)
Mobility Specialist: Progress Note   12/17/22 1703  Mobility  Activity Ambulated independently in hallway  Level of Assistance Modified independent, requires aide device or extra time  Assistive Device Front wheel walker  Distance Ambulated (ft) 350 ft  Activity Response Tolerated well  Mobility Referral Yes  $Mobility charge 1 Mobility  Mobility Specialist Start Time (ACUTE ONLY) 1512  Mobility Specialist Stop Time (ACUTE ONLY) 1529  Mobility Specialist Time Calculation (min) (ACUTE ONLY) 17 min    Pt was agreeable to mobility session - received in bed. ModI thoroughout. Had c/o RLE pain rated 6/10 but tolerable. Returned to room without fault. Left in bed with all needs met, call bell in reach.   Maurene Capes Mobility Specialist Please contact via SecureChat or Rehab office at [phone]

## 2022-12-17 NOTE — Progress Notes (Signed)
Mobility Specialist: Progress Note   12/17/22 1703  Mobility  Activity Ambulated independently in hallway  Level of Assistance Modified independent, requires aide device or extra time  Assistive Device None  Distance Ambulated (ft) 300 ft  Activity Response Tolerated well  Mobility Referral Yes  $Mobility charge 1 Mobility  Mobility Specialist Start Time (ACUTE ONLY) 1530  Mobility Specialist Stop Time (ACUTE ONLY) 1550  Mobility Specialist Time Calculation (min) (ACUTE ONLY) 20 min    Received pt in bed having no complaints and agreeable to mobility. Slightly unsteady at beginning of ambulation but no LOB. Pt was asymptomatic throughout ambulation and returned to room w/o fault. Left in bed w/ call bell in reach and all needs met.   Maurene Capes Mobility Specialist Please contact via SecureChat or Rehab office at [phone]

## 2022-12-17 NOTE — Assessment & Plan Note (Signed)
Improving.  MRI completed this morning. -Continue IV Rocephin 1 g daily -Continue scheduled Tylenol and as needed oxycodone -Wound care consulted, appreciate recommendations -Follow-up MRI results Follow-up US LE

## 2022-12-17 NOTE — Progress Notes (Signed)
Daily Progress Note Intern Pager: [phone]  Patient name: Mason Hill Medical record number: [id number] Date of birth: August 19, 1940 Age: 82 y.o. Gender: male  Primary Care Provider: Nonda Lou, MD Consultants: Cardiology Code Status: full  Pt Overview and Major Events to Date:  10/18: Admitted  Assessment and Plan:  Mason Hill is a pleasant 81 year old male who presented with days of worsening LLE swelling and pain, admitted for cellulitis.  Is currently stable, he is being treated with IV ceftriaxone and pain control regimen with regular evaluation by wound care.  He has a mild to moderate pericardial effusion without evidence of tamponade, cardiology is following. Assessment & Plan Cellulitis of left lower extremity Improving.  MRI completed this morning. -Continue IV Rocephin 1 g daily -Continue scheduled Tylenol and as needed oxycodone -Wound care consulted, appreciate recommendations -Follow-up MRI results Follow-up US LE Protein-calorie malnutrition (HCC) Stable. - RD following, recs appreciated -Added Ensure supplements PAD (peripheral artery disease) (HCC) Left lower arterial duplex still pending.  Continue with Eliquis. AKI (acute kidney injury) (HCC) CR today 1.26 (baseline 1.05), BUN 43, GFR 57.  Improving - continue to monitor with AM BMP -- avoid nephrotoxic medications  Chronic diastolic congestive heart failure [facility]) Cardiology has been consulted for relation of small pericardial effusion seen incidentally on chest x-ray.  Patient has a history of chronic diastolic congestive heart failure which is well-known. -Echocardiogram-completed does not show evidence of tamponade at this time. -Cardiology following, appreciate recommendations -Recommend outpatient referrals to [facility] cardiology and electrophysiology as patient previously was following with [facility] but would like local care -Exam findings from yesterday have resolved.   Chronic and  Stable Problems:  A-fib: Continue Eliquis, avoid AV nodal agents HLD: Continue home Lipitor 40 mg T2DM: Continue home Jardiance HTN: Holding due to soft pressures   FEN/GI: Heart healthy PPx: Eliquis Dispo:Home pending clinical improvement .   Subjective:  Patient resting comfortably in bed this morning following MRI exam.  States that the cough he had yesterday is gone and he is feeling much better.  States he is still having some gas but tends to resolve on its own.  Objective: Temp:  [97.9 F (36.6 C)-98.2 F (36.8 C)] 97.9 F (36.6 C) (10/21 0900) Pulse Rate:  [36-43] 43 (10/21 0900) Resp:  [17-20] 19 (10/21 0514) BP: (122-131)/(57-63) 122/63 (10/21 0514) SpO2:  [96 %-100 %] 100 % (10/21 0900) Physical Exam: General: Alert, oriented.  No apparent distress Cardiovascular: Bradycardic, no M/R/G Respiratory: CTAB, no increased work of breathing Abdomen: Flat, soft, nontender. Extremities: LLE with wound dressing in place today.  2+ pulse palpable.  RLE with trace edema, 2+ pedal pulse.  Laboratory: Most recent CBC Lab Results  Component Value Date   WBC 7.3 12/17/2022   HGB 11.7 (L) 12/17/2022   HCT 36.6 (L) 12/17/2022   MCV 92.9 12/17/2022   PLT 279 12/17/2022   Most recent BMP    Latest Ref Rng & Units 12/17/2022    8:27 AM  BMP  Glucose 70 - 99 mg/dL 130   BUN 8 - 23 mg/dL 43   Creatinine 8.65 - 1.24 mg/dL 7.84   Sodium 696 - 295 mmol/L 140   Potassium 3.5 - 5.1 mmol/L 3.6   Chloride 98 - 111 mmol/L 103   CO2 22 - 32 mmol/L 29   Calcium 8.9 - 10.3 mg/dL 8.4    MRI result pending  Gerrit Heck, DO 12/17/2022, 9:38 AM  PGY-1, [facility] FPTS Intern  pager: [phone], text pages welcome Secure chat group [facility] Teaching Service

## 2022-12-17 NOTE — Assessment & Plan Note (Signed)
Left lower arterial duplex still pending.  Continue with Eliquis.

## 2022-12-17 NOTE — Progress Notes (Signed)
Patient Name: Mason Hill Date of Encounter: 12/17/2022 [facility] Cardiologist: Christell Constant, MD  Date of Admit: 12/14/2022   Interval Summary  .    Mason Hill is a 82 y.o. 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  Vital Signs .    Vitals:   12/16/22 2200 12/16/22 2241 12/17/22 0514 12/17/22 0900  BP:   122/63   Pulse:   (!) 36 (!) 43  Resp: 20 17 19    Temp:   98 F (36.7 C) 97.9 F (36.6 C)  TempSrc:   Oral Oral  SpO2:   96% 100%  Weight:      Height:        Intake/Output Summary (Last 24 hours) at 12/17/2022 1203 Last data filed at 12/17/2022 0500 Gross per 24 hour  Intake --  Output 1400 ml  Net -1400 ml      12/14/2022   10:51 AM 11/03/2022    7:35 AM 11/02/2022    7:38 AM  Last 3 Weights  Weight (lbs) 192 lb 7.4 oz 192 lb 7.4 oz 201 lb 8 oz  Weight (kg) 87.3 kg 87.3 kg 91.4 kg     Lab Results  Component Value Date   NA 140 12/17/2022   K 3.6 12/17/2022   CO2 29 12/17/2022   GLUCOSE 129 (H) 12/17/2022   BUN 43 (H) 12/17/2022   CREATININE 1.26 (H) 12/17/2022   CALCIUM 8.4 (L) 12/17/2022   GFRNONAA 57 (L) 12/17/2022      Latest Ref Rng & Units 12/17/2022    8:27 AM 12/16/2022    9:54 AM 12/15/2022    4:02 AM  BMP  Glucose 70 - 99 mg/dL 213  086  578   BUN 8 - 23 mg/dL 43  49  47   Creatinine 0.61 - 1.24 mg/dL 4.69  6.29  5.28   Sodium 135 - 145 mmol/L 140  136  137   Potassium 3.5 - 5.1 mmol/L 3.6  3.7  3.9   Chloride 98 - 111 mmol/L 103  100  98   CO2 22 - 32 mmol/L 29  26  24     Calcium 8.9 - 10.3 mg/dL 8.4  8.2  9.1       Latest Ref Rng & Units 12/17/2022    8:27 AM 12/16/2022    9:54 AM 12/15/2022    4:02 AM  CBC  WBC 4.0 - 10.5 K/uL 7.3  8.8  13.6   Hemoglobin 13.0 - 17.0 g/dL 41.3  24.4  01.0   Hematocrit 39.0 - 52.0 % 36.6  33.3  31.6   Platelets 150 - 400 K/uL 279  242  228     Telemetry/ECG/Cardiac studies    A. Fib with  controlled ventricular response, brief episodes of NSVT.  No symptoms reported.  12/17/2022. - Personally Reviewed  Echocardiogram 12/16/2022: 1. Limited Echo to evaluate for pericardial effusion.  2. Left ventricular ejection fraction, by estimation, is 60 to 65%. The left ventricle has normal function. The left ventricle has no regional wall motion abnormalities. Left ventricular diastolic function could not be evaluated.  3. Right ventricular systolic function  is mildly reduced. The right ventricular size is moderately enlarged. There is moderately elevated pulmonary artery systolic pressure. The estimated right ventricular systolic pressure is 52.0 mmHg.  4. Small pericardial effusion along the lateral wall of LV. Moderate pericardial effusion along RA and RV. There is no evidence of cardiac tamponade.  5. The inferior vena cava is dilated in size with <50% respiratory variability, suggesting right atrial pressure of 15 mmHg.  Physical Exam .    Blood pressure 122/63, pulse (!) 43, temperature 97.9 F (36.6 C), temperature source Oral, resp. rate 19, height 6' (1.829 m), weight 87.3 kg, SpO2 100%.     12/17/2022    9:00 AM 12/17/2022    5:14 AM 12/16/2022    9:00 PM  Vitals with BMI  Systolic  122 131  Diastolic  63 57  Pulse 43 36     Physical Exam Neck:     Vascular: No carotid bruit or JVD.  Cardiovascular:     Rate and Rhythm: Normal rate and regular rhythm.     Pulses:          Dorsalis pedis pulses are 0 on the right side and 0 on the left side.       Posterior tibial pulses are 0 on the right side and 0  on the left side.     Heart sounds: Normal heart sounds. No murmur heard.    No gallop.  Pulmonary:     Effort: Pulmonary effort is normal.     Breath sounds: Normal breath sounds.  Abdominal:     General: Bowel sounds are normal.     Palpations: Abdomen is soft.  Musculoskeletal:     Right lower leg: No edema.     Left lower leg: Edema (3+ edema with thickened skin and warm to touch. Superficial cellulitis noted) present.    Assessment & Plan .     1.  Permanent atrial fibrillation with controlled ventricular response, upon review of external records in [facility] office visit, appears that patient has permanent atrial fibrillation and is presently appropriately on Eliquis.  He is rate controlled.  CHA2DS2-VASc Score is 5.  Yearly risk of stroke: 7% (A, HTN, DM, Vasc Dz).  Score of 1=0.6; 2=2.2; 3=3.2; 4=4.8; 5=7.2; 6=9.8; 7=>9.8) -(CHF; HTN; vasc disease DM,  Male = 1; Age <65 =0; 65-74 = 1,  >75 =2; stroke/embolism= 2).    2.  Asymptomatic bradycardia and asymptomatic episodes of junctional escape rhythm. 3.  NSVT, preserved EF, known coronary disease treated medically and remains asymptomatic. 4.  Left lower extremity cellulitis secondary to chronic venous insufficiency, lymphedema and also peripheral arterial disease from diabetes mellitus and small vessel disease with history of angioplasty to left AT in September 2022. 5.  Chronic diastolic heart failure   Recommendations: With regard to permanent atrial fibrillation, asymptomatic junctional escape rhythm, marked decrease in heart rate appears to be an error due to heart rate variability and he remains completely asymptomatic with normal hemodynamics.  No further evaluation or pacemaker indication exists and there is no heart block noted on telemetry.  With regard to NSVT, due to bradycardia, he is not on a beta-blocker.  With regard to chronic diastolic heart failure, he is presently on Jardiance, furosemide and spironolactone which  she is tolerating, not on an ACE inhibitor or ARB again due to low blood pressure and chronic renal insufficiency, stage III.  Left lower extremity cellulitis related to diabetes mellitus and management is as per primary team.  He does have peripheral artery disease but has presentation is not consistent with acute arterial insufficiency.  From cardiac standpoint no further evaluation is indicated, will sign off, please call if questions.   For questions or updates, please contact [facility] Please consult [email or web address] for contact info under      Yates Decamp, MD, [facility] 12/17/2022, 12:21 PM [facility] [address] [place], [place] [address] Phone: [phone]. Fax:  [phone]

## 2022-12-17 NOTE — Assessment & Plan Note (Signed)
Cardiology has been consulted for relation of small pericardial effusion seen incidentally on chest x-ray.  Patient has a history of chronic diastolic congestive heart failure which is well-known. -Echocardiogram-completed does not show evidence of tamponade at this time. -Cardiology following, appreciate recommendations -Recommend outpatient referrals to [facility] cardiology and electrophysiology as patient previously was following with [facility] but would like local care -Exam findings from yesterday have resolved.

## 2022-12-17 NOTE — Care Management Important Message (Signed)
Important Message  Patient Details  Name: Mason Hill MRN: [id number] Date of Birth: 1940/07/21   Important Message Given:  Yes - Medicare IM     Sherilyn Banker 12/17/2022, 4:21 PM

## 2022-12-17 NOTE — Assessment & Plan Note (Signed)
CR today 1.26 (baseline 1.05), BUN 43, GFR 57.  Improving - continue to monitor with AM BMP -- avoid nephrotoxic medications

## 2022-12-17 NOTE — Assessment & Plan Note (Signed)
Stable. - RD following, recs appreciated -Added Ensure supplements

## 2022-12-18 ENCOUNTER — Encounter (HOSPITAL_COMMUNITY): Payer: Medicare Other

## 2022-12-18 DIAGNOSIS — L03116 Cellulitis of left lower limb: Secondary | ICD-10-CM | POA: Diagnosis not present

## 2022-12-18 LAB — BASIC METABOLIC PANEL
Anion gap: 15 (ref 5–15)
BUN: 37 mg/dL — ABNORMAL HIGH (ref 8–23)
CO2: 30 mmol/L (ref 22–32)
Calcium: 9.2 mg/dL (ref 8.9–10.3)
Chloride: 100 mmol/L (ref 98–111)
Creatinine, Ser: 0.83 mg/dL (ref 0.61–1.24)
GFR, Estimated: >60 mL/min (ref >60)
Glucose, Bld: 120 mg/dL — ABNORMAL HIGH (ref 70–99)
Potassium: 3.6 mmol/L (ref 3.5–5.1)
Sodium: 145 mmol/L (ref 135–145)

## 2022-12-18 MED ORDER — CEFADROXIL 500 MG PO CAPS
500.0000 mg | ORAL_CAPSULE | Freq: Two times a day (BID) | ORAL | Status: DC
  Administered 2022-12-18 – 2022-12-19 (×3): 500 mg via ORAL
  Filled 2022-12-18 (×3): qty 1

## 2022-12-18 NOTE — Progress Notes (Signed)
Daily Progress Note Intern Pager: [phone]  Patient name: Mason Hill Medical record number: [id number] Date of birth: 01/04/1941 Age: 82 y.o. Gender: male  Primary Care Provider: Nonda Lou, MD Consultants: Cardiology Code Status: full  Pt Overview and Major Events to Date:  10/18: Admitted to [facility]MTS  Assessment and Plan:  Mason Hill is a pleasant 82 year old male who presented with days of worsening LLE swelling and pain, admitted for cellulitis treatment.  Is currently improving and being treated with IV ceftriaxone and pain control regimen with regular evaluation by wound care.  Cardiology has signed off on his care. Assessment & Plan Cellulitis of left lower extremity Continued improvement, MRI without evidence of osteo-.  Still awaiting LE US arterial study, touched base with Vascular and it will likely be done tomorrow.  -Transitioned to oral cefadroxil 500 mg BID -Continue scheduled Tylenol and as needed oxycodone -Wound care following, appreciate recommendations -LE ultrasound to be completed tomorrow Protein-calorie malnutrition (HCC) Stable. - RD following, recs appreciated -Added Ensure supplements PAD (peripheral artery disease) (HCC) Left lower arterial duplex still pending, to be completed tomorrow.  Continue with Eliquis. AKI (acute kidney injury) (HCC) CR today 0.83 (baseline 1.05), BUN 37, GFR >60.  Resolving -AM BMP to close the loop?   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   Subjective:  Patient was awake resting comfortably in bed on exam this morning.  He has no complaints and states that he is  feeling well.  States that he would feel more comfortable with at least 1 more day in the hospital and given his arterial ultrasound has not yet been completed this is very appropriate.  He is agreeable to transitioning to oral antibiotics in the next few days.  Objective: Temp:  [97.9 F (36.6 C)-98.5 F (36.9 C)] 98.4 F (36.9 C) (10/22 0105) Pulse Rate:  [37-43] 37 (10/21 2100) Resp:  [16-18] 16 (10/22 0505) BP: (118-132)/(50-57) 129/57 (10/22 0505) SpO2:  [98 %-100 %] 98 % (10/22 0505) Physical Exam: General: Alert, oriented.  No obvious distress Cardiovascular: Bradycardic, no M/R/G Respiratory: CTAB, no increased work of breathing Abdomen: Flat, soft, nontender. Extremities: Right lower leg, trace edema.  Left leg improving edema.  Wound dressing in place.  Laboratory: Most recent CBC Lab Results  Component Value Date   WBC 7.3 12/17/2022   HGB 11.7 (L) 12/17/2022   HCT 36.6 (L) 12/17/2022   MCV 92.9 12/17/2022   PLT 279 12/17/2022   Most recent BMP    Latest Ref Rng & Units 12/17/2022    8:27 AM  BMP  Glucose 70 - 99 mg/dL 027   BUN 8 - 23 mg/dL 43   Creatinine 2.53 - 1.24 mg/dL 6.64   Sodium 403 - 474 mmol/L 140   Potassium 3.5 - 5.1 mmol/L 3.6   Chloride 98 - 111 mmol/L 103   CO2 22 - 32 mmol/L 29   Calcium 8.9 - 10.3 mg/dL 8.4     Imaging/Diagnostic Tests:  MRI of left foot without contrast IMPRESSION: 1. Diffuse soft tissue swelling. No abscess or osteomyelitis.  Gerrit Heck, DO 12/18/2022, 7:34 AM  PGY-1, [facility] FPTS Intern pager: [phone], text pages welcome Secure chat group  [facility] Teaching Service

## 2022-12-18 NOTE — Plan of Care (Signed)
  Problem: Activity: Goal: Risk for activity intolerance will decrease Outcome: Progressing   Problem: Nutrition: Goal: Adequate nutrition will be maintained Outcome: Progressing   Problem: Coping: Goal: Level of anxiety will decrease Outcome: Progressing   Problem: Elimination: Goal: Will not experience complications related to urinary retention Outcome: Progressing   Problem: Pain Managment: Goal: General experience of comfort will improve Outcome: Progressing   Problem: Safety: Goal: Ability to remain free from injury will improve Outcome: Progressing   Problem: Skin Integrity: Goal: Risk for impaired skin integrity will decrease Outcome: Progressing   

## 2022-12-18 NOTE — Assessment & Plan Note (Addendum)
Continued improvement, MRI without evidence of osteo-.  Still awaiting LE US arterial study, touched base with Vascular and it will likely be done tomorrow.  -Transitioned to oral cefadroxil 500 mg BID -Continue scheduled Tylenol and as needed oxycodone -Wound care following, appreciate recommendations -LE ultrasound to be completed tomorrow

## 2022-12-18 NOTE — Assessment & Plan Note (Deleted)
Cardiology has been consulted for relation of small pericardial effusion seen incidentally on chest x-ray.  Patient has a history of chronic diastolic congestive heart failure which is well-known. -Echocardiogram-completed does not show evidence of tamponade at this time. -Cardiology following, appreciate recommendations -Recommend outpatient referrals to [facility] cardiology and electrophysiology as patient previously was following with [facility] but would like local care -Exam findings from yesterday have resolved.

## 2022-12-18 NOTE — Assessment & Plan Note (Addendum)
Left lower arterial duplex still pending, to be completed tomorrow.  Continue with Eliquis.

## 2022-12-18 NOTE — Progress Notes (Signed)
Mobility Specialist: Progress Note   12/18/22 1448  Mobility  Activity Ambulated independently in hallway  Level of Assistance Independent  Assistive Device None  Distance Ambulated (ft) 400 ft  Activity Response Tolerated well  Mobility Referral Yes  $Mobility charge 1 Mobility  Mobility Specialist Start Time (ACUTE ONLY) 1426  Mobility Specialist Stop Time (ACUTE ONLY) 1432  Mobility Specialist Time Calculation (min) (ACUTE ONLY) 6 min    Pt was agreeable to mobility session - received in bed. Had c/o LLE pain. Ind throughout. Returned to room without fault. Left ambulating in BR with all needs met.   Maurene Capes Mobility Specialist Please contact via SecureChat or Rehab office at [phone]

## 2022-12-18 NOTE — Assessment & Plan Note (Addendum)
CR today 0.83 (baseline 1.05), BUN 37, GFR >60.  Resolving -AM BMP to close the loop?

## 2022-12-18 NOTE — Progress Notes (Signed)
2312 V/s stable. Pt sitting up in bed watching TV. Denied Chest pain/chest discomfort/SOB. Pt asymptomatic at this time.  Breathing spontaneously on room air. Tele Tech reported [facility] 12 Beats & also states it looks like Wide QRS @ 2304. Charge RN--Shambrea, RN notified & MD to be notified.  HR 42 bpm --Sinus Huston Foley now.

## 2022-12-18 NOTE — Assessment & Plan Note (Signed)
Stable. - RD following, recs appreciated -Added Ensure supplements

## 2022-12-18 NOTE — Plan of Care (Signed)

## 2022-12-19 ENCOUNTER — Inpatient Hospital Stay (HOSPITAL_COMMUNITY): Payer: Medicare Other

## 2022-12-19 ENCOUNTER — Other Ambulatory Visit (HOSPITAL_COMMUNITY): Payer: Self-pay

## 2022-12-19 DIAGNOSIS — L03116 Cellulitis of left lower limb: Secondary | ICD-10-CM | POA: Diagnosis not present

## 2022-12-19 DIAGNOSIS — I7090 Unspecified atherosclerosis: Secondary | ICD-10-CM

## 2022-12-19 LAB — VAS US ABI WITH/WO TBI
Left ABI: 0.57
Right ABI: 0.42

## 2022-12-19 MED ORDER — PROSOURCE PLUS PO LIQD: 30.0000 mL | Freq: Two times a day (BID) | ORAL | Status: AC

## 2022-12-19 MED ORDER — SIMETHICONE 80 MG PO CHEW: 80.0000 mg | CHEWABLE_TABLET | Freq: Four times a day (QID) | ORAL | Status: DC | PRN

## 2022-12-19 MED ORDER — CEFADROXIL 500 MG PO CAPS
500.0000 mg | ORAL_CAPSULE | Freq: Two times a day (BID) | ORAL | 0 refills | Status: AC
  Filled 2022-12-19: qty 10, 5d supply, fill #0

## 2022-12-19 MED ORDER — HYDROCERIN EX CREA: 1.0000 | TOPICAL_CREAM | Freq: Every day | CUTANEOUS | Status: DC

## 2022-12-19 MED ORDER — ADULT MULTIVITAMIN W/MINERALS CH: 1.0000 | ORAL_TABLET | Freq: Every day | ORAL | Status: DC

## 2022-12-19 MED ORDER — OXYCODONE HCL 5 MG PO TABS
5.0000 mg | ORAL_TABLET | Freq: Four times a day (QID) | ORAL | 0 refills | Status: DC | PRN
  Filled 2022-12-19: qty 5, 2d supply, fill #0

## 2022-12-19 NOTE — Assessment & Plan Note (Deleted)
Stable. - RD following, recs appreciated -Added Ensure supplements

## 2022-12-19 NOTE — Plan of Care (Signed)
  Problem: Clinical Measurements: Goal: Ability to avoid or minimize complications of infection will improve 12/19/2022 0327 by Quade Ramirez, Roswell Nickel, RN Outcome: Progressing 12/19/2022 0326 by Kahleb Mcclane, [place] Nickel, RN Outcome: Progressing   Problem: Skin Integrity: Goal: Skin integrity will improve 12/19/2022 0327 by Lilibeth Opie, Roswell Nickel, RN Outcome: Progressing 12/19/2022 0326 by Jaythen Hamme, Roswell Nickel, RN Outcome: Progressing   Problem: Education: Goal: Knowledge of General Education information will improve Description: Including pain rating scale, medication(s)/side effects and non-pharmacologic comfort measures 12/19/2022 0327 by Mykia Holton, Roswell Nickel, RN Outcome: Progressing 12/19/2022 0326 by Reynol Arnone, [place] Nickel, RN Outcome: Progressing   Problem: Health Behavior/Discharge Planning: Goal: Ability to manage health-related needs will improve 12/19/2022 0327 by Delrae Hagey, Roswell Nickel, RN Outcome: Progressing 12/19/2022 0326 by Melodye Swor, [place] Nickel, RN Outcome: Progressing   Problem: Clinical Measurements: Goal: Ability to maintain clinical measurements within normal limits will improve 12/19/2022 0327 by Tyric Rodeheaver, Roswell Nickel, RN Outcome: Progressing 12/19/2022 0326 by Luceal Hollibaugh, Roswell Nickel, RN Outcome: Progressing Goal: Will remain free from infection 12/19/2022 0327 by Karrington Mccravy, Roswell Nickel, RN Outcome: Progressing 12/19/2022 0326 by Landen Breeland, Roswell Nickel, RN Outcome: Progressing Goal: Diagnostic test results will improve 12/19/2022 0327 by Winni Ehrhard, Roswell Nickel, RN Outcome: Progressing 12/19/2022 0326 by Blenda Wisecup, Roswell Nickel, RN Outcome: Progressing Goal: Respiratory complications will improve 12/19/2022 0327 by Kroy Sprung, Roswell Nickel, RN Outcome: Progressing 12/19/2022 0326 by Kamilla Hands, Roswell Nickel, RN Outcome: Progressing Goal: Cardiovascular complication will be avoided 12/19/2022 0327 by Cashay Manganelli, Roswell Nickel, RN Outcome: Progressing 12/19/2022 0326 by Maleak Brazzel, [place] Nickel, RN Outcome: Progressing   Problem: Activity: Goal: Risk for  activity intolerance will decrease 12/19/2022 0327 by Crescent Gotham, Roswell Nickel, RN Outcome: Progressing 12/19/2022 0326 by Charity Tessier, Roswell Nickel, RN Outcome: Progressing   Problem: Nutrition: Goal: Adequate nutrition will be maintained 12/19/2022 0327 by Meagan Spease, Roswell Nickel, RN Outcome: Progressing 12/19/2022 0326 by Alex Leahy, Roswell Nickel, RN Outcome: Progressing   Problem: Coping: Goal: Level of anxiety will decrease 12/19/2022 0327 by Remie Mathison, Roswell Nickel, RN Outcome: Progressing 12/19/2022 0326 by Coben Godshall, Roswell Nickel, RN Outcome: Progressing   Problem: Elimination: Goal: Will not experience complications related to bowel motility 12/19/2022 0327 by Karinna Beadles, Roswell Nickel, RN Outcome: Progressing 12/19/2022 0326 by Cormac Wint, Roswell Nickel, RN Outcome: Progressing Goal: Will not experience complications related to urinary retention 12/19/2022 0327 by Chalise Pe, Roswell Nickel, RN Outcome: Progressing 12/19/2022 0326 by Dyasia Firestine, Roswell Nickel, RN Outcome: Progressing   Problem: Pain Managment: Goal: General experience of comfort will improve 12/19/2022 0327 by Daniell Paradise, Roswell Nickel, RN Outcome: Progressing 12/19/2022 0326 by Nylani Michetti, Roswell Nickel, RN Outcome: Progressing   Problem: Safety: Goal: Ability to remain free from injury will improve 12/19/2022 0327 by Darlin Stenseth, Roswell Nickel, RN Outcome: Progressing 12/19/2022 0326 by Taiwan Millon, Roswell Nickel, RN Outcome: Progressing   Problem: Skin Integrity: Goal: Risk for impaired skin integrity will decrease 12/19/2022 0327 by Jimi Schappert, Roswell Nickel, RN Outcome: Progressing 12/19/2022 0326 by Correll Denbow, Roswell Nickel, RN Outcome: Progressing

## 2022-12-19 NOTE — Discharge Summary (Addendum)
Family Medicine Teaching [facility] Discharge Summary  Patient name: Mason Hill Medical record number: [id number] Date of birth: 04-06-1940 Age: 82 y.o. Gender: male Date of Admission: 12/14/2022  Date of Discharge: 12/19/2022 Admitting Physician: [name], MD  Primary Care Provider: Nonda Lou, MD Consultants: Cardiology  Indication for Hospitalization: Cellulitis  Discharge Diagnoses/Problem List:  Principal Problem for Admission: Cellulitis of the left lower extremity Other Problems addressed during stay:  Principal Problem:   Cellulitis of left lower extremity Active Problems:   Chronic diastolic congestive heart failure (HCC)   Coronary artery disease   Protein-calorie malnutrition (HCC)   PAD (peripheral artery disease) (HCC)   AKI (acute kidney injury) (HCC)   Bradycardia by electrocardiogram   Permanent atrial fibrillation (HCC)   NSVT (nonsustained ventricular tachycardia) [facility])    Brief Hospital Course:  Mason Hill is a 82 y.o. male who was admitted to the [facility] Medicine Teaching Service at [facility] for LLE Cellulitis. Hospital course is outlined below by problem.   Left Lower Extremity Cellulitis Patient presented to the ED with several day history of worsening swelling and pain in the LLE. Preliminary imaging and labs were unremarkable, and suggestive of soft tissue infection. Patient was started on IV antibiotics and admitted to FMTS. He improved daily with continued antibiotics, and MRI showed no evidence of bony involvement or focal consolidation suggestive of abscess. Wound care was consulted for appropriate dressing management. On discharge he was tolerating oral antibiotics. He will complete a 6 day course of Cefadroxil outpatient.  Asymptomatic Bradycardia Patient was bradycardic to the 40s on admission and through out stay into the 30s at times. Cardiology was consulted and felt he was not a true bradycardia, but more likely a  low ventricular response a-fib. EKG showed no evidence of first degree heart block, but did show paroxysmal junctional escape rhythm. AV-node blocking agents were held during admission. On discharge he was stable with heart rates in the 40s.  Chronic diastolic congestive heart failure Patient had small pericardial effusion on imaging, but was asymptomatic through out admission. Cardiology recommended outpatient follow up with [facility] and EP, and to continue Jardiance, furosemide, and spironolactone. ACE/ARB was held due to low blood pressures in the hospital. On discharge patient was stable and instructed to restart medications after seeing outpatient primary care.   Issues for follow up: Follow outpatient with cardiology/EP for potential pacemaker  Restart losartan, lopressor as appropriate Follow up with [facility] for PAD  Disposition: Home  Discharge Condition: Stable  Discharge Exam:  Vitals:   12/18/22 2313 12/19/22 0946  BP: (!) 123/54 (!) 119/52  Pulse: (!) 42 (!) 44  Resp:  18  Temp: 98.1 F (36.7 C)   SpO2: 99% 90%   General: Alert, oriented, no obvious distress. Cardiac: Bradycardic, no M/R/G Respiratory CTAB, no increased work of breathing Abdominal: Flat, soft, nontender Extremities: left lower extremity 1+ edema, cracking has healed between the first and second digits in the third and fourth digits.  Right lower extremity with trace pitting edema  Significant Procedures: None  Significant Labs and Imaging:  No results for input(s): "WBC", "HGB", "HCT", "PLT" in the last 48 hours. Recent Labs  Lab 12/18/22 0759  NA 145  K 3.6  CL 100  CO2 30  GLUCOSE 120*  BUN 37*  CREATININE 0.83  CALCIUM 9.2    VAS [place] ABI with and without TBI Summary:  Right: Resting right ankle-brachial index indicates severe right lower  extremity arterial disease.  Left: Resting left ankle-brachial index indicates moderate left lower  extremity arterial disease. The left  toe-brachial index is abnormal.   Lower extremity arterial duplex study Summary:  Right: Total occlusion noted in the anterior tibial artery. 75-99%  stenosis noted in the posterior tibial artery.   Left: 30-49% stenosis noted in the superficial femoral artery. 50-74%  stenosis noted in the popliteal artery.    Discharge Medications:  Allergies as of 12/19/2022       Reactions   Codeine Other (See Comments), Rash   Other Reaction: BLISTERING, PEELING.   Erythromycin    Isosorbide Nitrate    Other reaction(s): Headache        Medication List     STOP taking these medications    losartan 50 MG tablet Commonly known as: COZAAR   metoprolol tartrate 25 MG tablet Commonly known as: LOPRESSOR       TAKE these medications    (feeding supplement) PROSource Plus liquid Take 30 mLs by mouth 2 (two) times daily between meals.   acetaminophen 500 MG tablet Commonly known as: TYLENOL Take 1,000 mg by mouth once as needed for moderate pain (pain score 4-6).   atorvastatin 40 MG tablet Commonly known as: LIPITOR Take 1 tablet (40 mg total) by mouth daily.   cefadroxil 500 MG capsule Commonly known as: DURICEF Take 1 capsule (500 mg total) by mouth 2 (two) times daily for 5 days.   Eliquis 5 MG Tabs tablet Generic drug: apixaban Take 1 tablet (5 mg total) by mouth 2 (two) times daily.   furosemide 40 MG tablet Commonly known as: Lasix Take 1 tablet (40 mg total) by mouth 2 (two) times daily.   hydrocerin Crea Apply 1 Application topically daily. Start taking on: December 20, 2022   Jardiance 10 MG Tabs tablet Generic drug: empagliflozin Take 10 mg by mouth daily.   multivitamin with minerals Tabs tablet Take 1 tablet by mouth daily. Start taking on: December 20, 2022   oxyCODONE 5 MG immediate release tablet Commonly known as: Oxy IR/ROXICODONE Take 1 tablet (5 mg total) by mouth every 6 (six) hours as needed for severe pain (pain score 7-10).    simethicone 80 MG chewable tablet Commonly known as: MYLICON Chew 1 tablet (80 mg total) by mouth every 6 (six) hours as needed for flatulence.   spironolactone 50 MG tablet Commonly known as: ALDACTONE Take 0.5 tablets (25 mg total) by mouth daily.        Discharge Instructions: Please refer to Patient Instructions section of EMR for full details.  Patient was counseled important signs and symptoms that should prompt return to medical care, changes in medications, dietary instructions, activity restrictions, and follow up appointments.   Follow-Up Appointments:  Follow-up Information     Shapely-Quinn, Desiree Lucy, MD. Schedule an appointment as soon as possible for a visit.   Specialty: Family Medicine Why: Make an appointment for hospital follow up ASAP. Contact information: [address] [place] [address] [phone]                 Gerrit Heck, DO 12/19/2022, 2:15 PM PGY-1, [facility]   Upper Level Addendum:  I have seen and evaluated this patient along with Dr. Hyacinth Meeker and reviewed the above note, making necessary revisions.  Darral Dash, DO 12/19/2022, 6:47 PM PGY-3, [facility]

## 2022-12-19 NOTE — Discharge Instructions (Signed)
Dear Mason Hill,  Thank you for letting us participate in your care. You were hospitalized for leg pain and diagnosed with Cellulitis of left lower extremity. You were treated with antibiotics and pain medications with improvement.   POST-HOSPITAL & CARE INSTRUCTIONS Be sure to take all of your medications as listed in this packet. Continue the antibiotics we have sent you. Go to your follow up appointments (listed below), especially with your PCP and cardiology:  DOCTOR'S APPOINTMENT   Future Appointments  Date Time Provider Department Center  12/19/2022 10:00 AM [facility] US 3 [facility]  12/19/2022 11:00 AM [facility] US 3 [facility]    Follow-up Information     Shapely-Quinn, Desiree Lucy, MD. Schedule an appointment as soon as possible for a visit.   Specialty: Family Medicine Why: Make an appointment for hospital follow up ASAP. Contact information: [address] [place] [address] [phone]                 Take care and be well!  Family Medicine Teaching Service Inpatient Team [place]  [facility]  [address] [place], [place] [address] [phone]

## 2022-12-19 NOTE — Progress Notes (Signed)
Alert and oriented, verbalized understanding of dc instructions. Lower extremities dressing changed/wound care done per order. All belongings given to patient.

## 2022-12-19 NOTE — Assessment & Plan Note (Deleted)
Continued improvement, MRI without evidence of osteo-.  Still awaiting LE US arterial study, touched base with Vascular and it will likely be done tomorrow.  -Transitioned to oral cefadroxil 500 mg BID -Continue scheduled Tylenol and as needed oxycodone -Wound care following, appreciate recommendations -LE ultrasound to be completed tomorrow

## 2022-12-19 NOTE — Assessment & Plan Note (Deleted)
CR today 0.83 (baseline 1.05), BUN 37, GFR >60.  Resolving -AM BMP to close the loop?

## 2022-12-19 NOTE — Assessment & Plan Note (Deleted)
Cardiology has been consulted for relation of small pericardial effusion seen incidentally on chest x-ray.  Patient has a history of chronic diastolic congestive heart failure which is well-known. -Echocardiogram-completed does not show evidence of tamponade at this time. -Cardiology following, appreciate recommendations -Recommend outpatient referrals to [facility] cardiology and electrophysiology as patient previously was following with [facility] but would like local care -Exam findings from yesterday have resolved.

## 2022-12-19 NOTE — Assessment & Plan Note (Deleted)
Left lower arterial duplex still pending, to be completed tomorrow.  Continue with Eliquis.

## 2022-12-28 ENCOUNTER — Telehealth: Payer: Self-pay | Admitting: Family Medicine

## 2022-12-28 ENCOUNTER — Encounter: Payer: Self-pay | Admitting: Family Medicine

## 2022-12-28 NOTE — Telephone Encounter (Signed)
Arterial studies resulted after patient was discharged from the hospital.  ABIs worsened from several years ago at [facility]. Occlusions in ATA/PTA appear to have been there previously. Unable to reach patient via phone, Left HIPAA-compliant voicemail asking him to call back.  Planned to advise follow up with cardiology/vascular surgery. Sent copy of records to his [facility] PCP and [facility] cardiologist. Also sending patient a letter to follow up with them.  [name], MD

## 2023-02-04 LAB — ECHO TEE

## 2023-03-04 ENCOUNTER — Other Ambulatory Visit (HOSPITAL_COMMUNITY): Payer: Self-pay

## 2023-09-21 ENCOUNTER — Emergency Department

## 2023-09-21 ENCOUNTER — Other Ambulatory Visit: Payer: Self-pay

## 2023-09-21 ENCOUNTER — Inpatient Hospital Stay
Admission: EM | Admit: 2023-09-21 | Discharge: 2023-09-26 | DRG: 871 | Disposition: A | Discharge status: Home / Self Care | Attending: Internal Medicine | Admitting: Internal Medicine

## 2023-09-21 DIAGNOSIS — I130 Hypertensive heart and chronic kidney disease with heart failure and stage 1 through stage 4 chronic kidney disease, or unspecified chronic kidney disease: Secondary | ICD-10-CM | POA: Diagnosis present

## 2023-09-21 DIAGNOSIS — I3139 Other pericardial effusion (noninflammatory): Secondary | ICD-10-CM | POA: Diagnosis present

## 2023-09-21 DIAGNOSIS — A419 Sepsis, unspecified organism: Secondary | ICD-10-CM | POA: Diagnosis present

## 2023-09-21 DIAGNOSIS — I2510 Atherosclerotic heart disease of native coronary artery without angina pectoris: Secondary | ICD-10-CM | POA: Diagnosis present

## 2023-09-21 DIAGNOSIS — I480 Paroxysmal atrial fibrillation: Secondary | ICD-10-CM | POA: Diagnosis present

## 2023-09-21 DIAGNOSIS — R6520 Severe sepsis without septic shock: Secondary | ICD-10-CM | POA: Diagnosis present

## 2023-09-21 DIAGNOSIS — Z955 Presence of coronary angioplasty implant and graft: Secondary | ICD-10-CM

## 2023-09-21 DIAGNOSIS — I5A Non-ischemic myocardial injury (non-traumatic): Secondary | ICD-10-CM | POA: Diagnosis present

## 2023-09-21 DIAGNOSIS — J9601 Acute respiratory failure with hypoxia: Secondary | ICD-10-CM | POA: Diagnosis present

## 2023-09-21 DIAGNOSIS — J189 Pneumonia, unspecified organism: Secondary | ICD-10-CM | POA: Diagnosis present

## 2023-09-21 DIAGNOSIS — N1831 Chronic kidney disease, stage 3a: Secondary | ICD-10-CM | POA: Diagnosis present

## 2023-09-21 DIAGNOSIS — I10 Essential (primary) hypertension: Secondary | ICD-10-CM | POA: Diagnosis not present

## 2023-09-21 DIAGNOSIS — Z1152 Encounter for screening for COVID-19: Secondary | ICD-10-CM | POA: Diagnosis not present

## 2023-09-21 DIAGNOSIS — I5032 Chronic diastolic (congestive) heart failure: Secondary | ICD-10-CM | POA: Diagnosis present

## 2023-09-21 DIAGNOSIS — X30XXXA Exposure to excessive natural heat, initial encounter: Secondary | ICD-10-CM

## 2023-09-21 DIAGNOSIS — R531 Weakness: Principal | ICD-10-CM

## 2023-09-21 DIAGNOSIS — R7989 Other specified abnormal findings of blood chemistry: Secondary | ICD-10-CM

## 2023-09-21 DIAGNOSIS — I21A1 Myocardial infarction type 2: Secondary | ICD-10-CM | POA: Diagnosis present

## 2023-09-21 DIAGNOSIS — R509 Fever, unspecified: Secondary | ICD-10-CM

## 2023-09-21 DIAGNOSIS — Z7902 Long term (current) use of antithrombotics/antiplatelets: Secondary | ICD-10-CM | POA: Diagnosis not present

## 2023-09-21 DIAGNOSIS — Z7901 Long term (current) use of anticoagulants: Secondary | ICD-10-CM | POA: Diagnosis not present

## 2023-09-21 DIAGNOSIS — I890 Lymphedema, not elsewhere classified: Secondary | ICD-10-CM | POA: Diagnosis present

## 2023-09-21 DIAGNOSIS — R7881 Bacteremia: Secondary | ICD-10-CM | POA: Diagnosis not present

## 2023-09-21 DIAGNOSIS — E1122 Type 2 diabetes mellitus with diabetic chronic kidney disease: Secondary | ICD-10-CM | POA: Diagnosis present

## 2023-09-21 DIAGNOSIS — B961 Klebsiella pneumoniae [K. pneumoniae] as the cause of diseases classified elsewhere: Secondary | ICD-10-CM | POA: Diagnosis present

## 2023-09-21 DIAGNOSIS — Z8679 Personal history of other diseases of the circulatory system: Secondary | ICD-10-CM

## 2023-09-21 DIAGNOSIS — T380X5A Adverse effect of glucocorticoids and synthetic analogues, initial encounter: Secondary | ICD-10-CM | POA: Diagnosis present

## 2023-09-21 DIAGNOSIS — Z6825 Body mass index (BMI) 25.0-25.9, adult: Secondary | ICD-10-CM

## 2023-09-21 DIAGNOSIS — E663 Overweight: Secondary | ICD-10-CM | POA: Diagnosis not present

## 2023-09-21 DIAGNOSIS — A4159 Other Gram-negative sepsis: Secondary | ICD-10-CM | POA: Diagnosis present

## 2023-09-21 DIAGNOSIS — E669 Obesity, unspecified: Secondary | ICD-10-CM | POA: Diagnosis present

## 2023-09-21 DIAGNOSIS — I959 Hypotension, unspecified: Secondary | ICD-10-CM | POA: Diagnosis present

## 2023-09-21 DIAGNOSIS — T675XXA Heat exhaustion, unspecified, initial encounter: Secondary | ICD-10-CM | POA: Diagnosis present

## 2023-09-21 DIAGNOSIS — Z885 Allergy status to narcotic agent status: Secondary | ICD-10-CM

## 2023-09-21 DIAGNOSIS — Z79899 Other long term (current) drug therapy: Secondary | ICD-10-CM

## 2023-09-21 DIAGNOSIS — J9811 Atelectasis: Secondary | ICD-10-CM | POA: Diagnosis present

## 2023-09-21 DIAGNOSIS — D72829 Elevated white blood cell count, unspecified: Secondary | ICD-10-CM | POA: Diagnosis not present

## 2023-09-21 DIAGNOSIS — E785 Hyperlipidemia, unspecified: Secondary | ICD-10-CM | POA: Diagnosis present

## 2023-09-21 DIAGNOSIS — Z1611 Resistance to penicillins: Secondary | ICD-10-CM | POA: Diagnosis present

## 2023-09-21 DIAGNOSIS — Z7984 Long term (current) use of oral hypoglycemic drugs: Secondary | ICD-10-CM

## 2023-09-21 DIAGNOSIS — E1129 Type 2 diabetes mellitus with other diabetic kidney complication: Secondary | ICD-10-CM | POA: Diagnosis present

## 2023-09-21 LAB — URINALYSIS, W/ REFLEX TO CULTURE (INFECTION SUSPECTED)
Bacteria, UA: NONE SEEN
Bilirubin Urine: NEGATIVE
Glucose, UA: >=500 mg/dL — AB
Hgb urine dipstick: NEGATIVE
Ketones, ur: NEGATIVE mg/dL
Leukocytes,Ua: NEGATIVE
Nitrite: NEGATIVE
Protein, ur: NEGATIVE mg/dL
Specific Gravity, Urine: 1.021 (ref 1.005–1.030)
pH: 6 (ref 5.0–8.0)

## 2023-09-21 LAB — CBC WITH DIFFERENTIAL/PLATELET
Abs Immature Granulocytes: 0.1 K/uL — ABNORMAL HIGH (ref 0.00–0.07)
Basophils Absolute: 0.1 K/uL (ref 0.0–0.1)
Basophils Relative: 0 %
Eosinophils Absolute: 0 K/uL (ref 0.0–0.5)
Eosinophils Relative: 0 %
HCT: 33.5 % — ABNORMAL LOW (ref 39.0–52.0)
Hemoglobin: 11 g/dL — ABNORMAL LOW (ref 13.0–17.0)
Immature Granulocytes: 1 %
Lymphocytes Relative: 2 %
Lymphs Abs: 0.2 K/uL — ABNORMAL LOW (ref 0.7–4.0)
MCH: 30.2 pg (ref 26.0–34.0)
MCHC: 32.8 g/dL (ref 30.0–36.0)
MCV: 92 fL (ref 80.0–100.0)
Monocytes Absolute: 0.7 K/uL (ref 0.1–1.0)
Monocytes Relative: 5 %
Neutro Abs: 13.3 K/uL — ABNORMAL HIGH (ref 1.7–7.7)
Neutrophils Relative %: 92 %
Platelets: 242 K/uL (ref 150–400)
RBC: 3.64 MIL/uL — ABNORMAL LOW (ref 4.22–5.81)
RDW: 14.8 % (ref 11.5–15.5)
WBC: 14.4 K/uL — ABNORMAL HIGH (ref 4.0–10.5)
nRBC: 0 % (ref 0.0–0.2)

## 2023-09-21 LAB — LIPID PANEL
Cholesterol: 151 mg/dL (ref 0–200)
HDL: 48 mg/dL (ref >40)
LDL Cholesterol: 94 mg/dL (ref 0–99)
Total CHOL/HDL Ratio: 3.1 ratio
Triglycerides: 43 mg/dL (ref <150)
VLDL: 9 mg/dL (ref 0–40)

## 2023-09-21 LAB — RESP PANEL BY RT-PCR (RSV, FLU A&B, COVID)  RVPGX2
Influenza A by PCR: NEGATIVE
Influenza B by PCR: NEGATIVE
Resp Syncytial Virus by PCR: NEGATIVE
SARS Coronavirus 2 by RT PCR: NEGATIVE

## 2023-09-21 LAB — PROTIME-INR
INR: 1.2 (ref 0.8–1.2)
Prothrombin Time: 15.7 s — ABNORMAL HIGH (ref 11.4–15.2)

## 2023-09-21 LAB — COMPREHENSIVE METABOLIC PANEL WITH GFR
ALT: 12 U/L (ref 0–44)
AST: 24 U/L (ref 15–41)
Albumin: 1.8 g/dL — ABNORMAL LOW (ref 3.5–5.0)
Alkaline Phosphatase: 70 U/L (ref 38–126)
Anion gap: 8 (ref 5–15)
BUN: 17 mg/dL (ref 8–23)
CO2: 26 mmol/L (ref 22–32)
Calcium: 8 mg/dL — ABNORMAL LOW (ref 8.9–10.3)
Chloride: 107 mmol/L (ref 98–111)
Creatinine, Ser: 1.16 mg/dL (ref 0.61–1.24)
GFR, Estimated: >60 mL/min (ref >60)
Glucose, Bld: 115 mg/dL — ABNORMAL HIGH (ref 70–99)
Potassium: 4 mmol/L (ref 3.5–5.1)
Sodium: 141 mmol/L (ref 135–145)
Total Bilirubin: 1 mg/dL (ref 0.0–1.2)
Total Protein: 4.3 g/dL — ABNORMAL LOW (ref 6.5–8.1)

## 2023-09-21 LAB — HEMOGLOBIN A1C
Hgb A1c MFr Bld: 6.1 % — ABNORMAL HIGH (ref 4.8–5.6)
Mean Plasma Glucose: 128.37 mg/dL

## 2023-09-21 LAB — MAGNESIUM: Magnesium: 1.7 mg/dL (ref 1.7–2.4)

## 2023-09-21 LAB — TROPONIN I (HIGH SENSITIVITY)
Troponin I (High Sensitivity): 189 ng/L (ref <18)
Troponin I (High Sensitivity): 187 ng/L
Troponin I (High Sensitivity): 190 ng/L (ref <18)

## 2023-09-21 LAB — GLUCOSE, CAPILLARY: Glucose-Capillary: 135 mg/dL — ABNORMAL HIGH (ref 70–99)

## 2023-09-21 LAB — CK: Total CK: 47 U/L — ABNORMAL LOW (ref 49–397)

## 2023-09-21 LAB — BRAIN NATRIURETIC PEPTIDE: B Natriuretic Peptide: 152.2 pg/mL — ABNORMAL HIGH (ref 0.0–100.0)

## 2023-09-21 LAB — LACTIC ACID, PLASMA: Lactic Acid, Venous: 1.8 mmol/L (ref 0.5–1.9)

## 2023-09-21 MED ORDER — DM-GUAIFENESIN ER 30-600 MG PO TB12: 1.0000 | ORAL_TABLET | Freq: Two times a day (BID) | ORAL | Status: DC | PRN

## 2023-09-21 MED ORDER — ASPIRIN 325 MG PO TABS
325.0000 mg | ORAL_TABLET | Freq: Once | ORAL | Status: AC
  Administered 2023-09-21: 325 mg via ORAL
  Filled 2023-09-21: qty 1

## 2023-09-21 MED ORDER — SODIUM CHLORIDE 0.9 % IV SOLN
2.0000 g | Freq: Two times a day (BID) | INTRAVENOUS | Status: DC
  Administered 2023-09-21: 2 g via INTRAVENOUS
  Filled 2023-09-21 (×2): qty 12.5

## 2023-09-21 MED ORDER — HYDROCORTISONE SOD SUC (PF) 100 MG IJ SOLR
100.0000 mg | Freq: Once | INTRAMUSCULAR | Status: AC
  Administered 2023-09-21: 100 mg via INTRAVENOUS
  Filled 2023-09-21: qty 2

## 2023-09-21 MED ORDER — ALBUMIN HUMAN 25 % IV SOLN
12.5000 g | Freq: Once | INTRAVENOUS | Status: AC
  Administered 2023-09-21: 12.5 g via INTRAVENOUS
  Filled 2023-09-21: qty 50

## 2023-09-21 MED ORDER — SODIUM CHLORIDE 0.9 % IV SOLN
100.0000 mg | Freq: Two times a day (BID) | INTRAVENOUS | Status: DC
  Filled 2023-09-21: qty 100

## 2023-09-21 MED ORDER — SODIUM CHLORIDE 0.9 % IV BOLUS (SEPSIS)
1000.0000 mL | Freq: Once | INTRAVENOUS | Status: AC
  Administered 2023-09-21: 1000 mL via INTRAVENOUS

## 2023-09-21 MED ORDER — ACETAMINOPHEN 325 MG PO TABS
650.0000 mg | ORAL_TABLET | Freq: Once | ORAL | Status: AC
  Administered 2023-09-21: 650 mg via ORAL
  Filled 2023-09-21: qty 2

## 2023-09-21 MED ORDER — SODIUM CHLORIDE 0.9 % IV SOLN
2.0000 g | Freq: Once | INTRAVENOUS | Status: AC
  Administered 2023-09-21: 2 g via INTRAVENOUS
  Filled 2023-09-21: qty 20

## 2023-09-21 MED ORDER — MIDODRINE HCL 5 MG PO TABS
10.0000 mg | ORAL_TABLET | Freq: Three times a day (TID) | ORAL | Status: DC
  Administered 2023-09-21 – 2023-09-24 (×9): 10 mg via ORAL
  Filled 2023-09-21 (×9): qty 2

## 2023-09-21 MED ORDER — SODIUM CHLORIDE 0.9 % IV BOLUS (SEPSIS)
500.0000 mL | Freq: Once | INTRAVENOUS | Status: AC
  Administered 2023-09-21: 500 mL via INTRAVENOUS

## 2023-09-21 MED ORDER — SODIUM CHLORIDE 0.9 % IV SOLN: INTRAVENOUS | Status: DC

## 2023-09-21 MED ORDER — SODIUM CHLORIDE 0.9 % IV SOLN: 2.0000 g | INTRAVENOUS | Status: DC

## 2023-09-21 MED ORDER — SODIUM CHLORIDE 0.9 % IV BOLUS: 1000.0000 mL | Freq: Once | INTRAVENOUS | Status: DC

## 2023-09-21 MED ORDER — INSULIN ASPART 100 UNIT/ML IJ SOLN: 0.0000 [IU] | Freq: Every day | INTRAMUSCULAR | Status: DC

## 2023-09-21 MED ORDER — IOHEXOL 350 MG/ML SOLN
75.0000 mL | Freq: Once | INTRAVENOUS | Status: AC | PRN
  Administered 2023-09-21: 75 mL via INTRAVENOUS

## 2023-09-21 MED ORDER — VANCOMYCIN HCL 1500 MG/300ML IV SOLN: 1500.0000 mg | INTRAVENOUS | Status: DC

## 2023-09-21 MED ORDER — MAGNESIUM SULFATE IN D5W 1-5 GM/100ML-% IV SOLN
1.0000 g | Freq: Once | INTRAVENOUS | Status: AC
  Administered 2023-09-21: 1 g via INTRAVENOUS
  Filled 2023-09-21: qty 100

## 2023-09-21 MED ORDER — ALBUTEROL SULFATE (2.5 MG/3ML) 0.083% IN NEBU: 2.5000 mg | INHALATION_SOLUTION | RESPIRATORY_TRACT | Status: DC | PRN

## 2023-09-21 MED ORDER — VANCOMYCIN HCL 1750 MG/350ML IV SOLN
1750.0000 mg | Freq: Once | INTRAVENOUS | Status: AC
  Administered 2023-09-21: 1750 mg via INTRAVENOUS
  Filled 2023-09-21 (×2): qty 350

## 2023-09-21 MED ORDER — INSULIN ASPART 100 UNIT/ML IJ SOLN
0.0000 [IU] | Freq: Three times a day (TID) | INTRAMUSCULAR | Status: DC
  Administered 2023-09-22: 1 [IU] via SUBCUTANEOUS
  Administered 2023-09-22 (×2): 2 [IU] via SUBCUTANEOUS
  Administered 2023-09-23 – 2023-09-26 (×4): 1 [IU] via SUBCUTANEOUS
  Filled 2023-09-21 (×7): qty 1

## 2023-09-21 NOTE — ED Triage Notes (Signed)
 Pt arrives via [facility] from a store for weakness. Pt found sitting in the [facility] in his car. Pt was out in the heat yesterday w=moving bricks. Temperature 100.4F on arrival EMS put ice packs on pt and gave 1000 mL of fluids  EMS vitals: 102.8 F temp 98% on 4L [place] 177 CBG

## 2023-09-21 NOTE — ED Provider Notes (Signed)
 [facility] Provider Note    Event Date/Time   First MD Initiated Contact with Patient 09/21/23 1539     (approximate)   History   Weakness   HPI  Mason Hill is a 83 year old male presenting to the emergency department for evaluation of weakness.  Fell as baseline yesterday, when he woke up this morning felt generally weak, was worse when he was sitting in his car so he called EMS.  He was working in the heat yesterday, but denies heat exposure today.  Initial temp of 102.8 with EMS.  Had ice packs placed as well and received 1 L of IV fluids.  CBG 177.  Denies cough, congestion, chest pain, shortness of breath, abdominal pain, vomiting, diarrhea, dysuria.     Physical Exam   Triage Vital Signs: ED Triage Vitals  Encounter Vitals Group     BP 09/21/23 1544 (!) 101/58     Girls Systolic BP Percentile --      Girls Diastolic BP Percentile --      Boys Systolic BP Percentile --      Boys Diastolic BP Percentile --      Pulse Rate 09/21/23 1544 (!) 113     Resp 09/21/23 1544 (!) 30     Temp 09/21/23 1544 100.2 F (37.9 C)     Temp Source 09/21/23 1544 Oral     SpO2 09/21/23 1544 95 %     Weight 09/21/23 1545 192 lb (87.1 kg)     Height 09/21/23 1547 6' (1.829 m)     Head Circumference --      Peak Flow --      Pain Score 09/21/23 1546 3     Pain Loc --      Pain Education --      Exclude from Growth Chart --     Most recent vital signs: Vitals:   09/21/23 1723 09/21/23 1803  BP:    Pulse: (!) 108   Resp: 13   Temp:  (!) 100.5 F (38.1 C)  SpO2: 93%      General: Awake, interactive  CV:  Tachycardic with regular rhythm, normal peripheral perfusion Resp:  Unlabored respirations, lungs good auscultation Abd:  Nondistended, soft, nontender Neuro:  Symmetric facial movement, fluid speech MSK:  Bilateral lower extremity swelling, both legs are warm to the touch in the setting of patient's fever, no appreciable redness, patient reports  appearance of his legs is at baseline Skin:  No sacral wounds  ED Results / Procedures / Treatments   Labs (all labs ordered are listed, but only abnormal results are displayed) Labs Reviewed  COMPREHENSIVE METABOLIC PANEL WITH GFR - Abnormal; Notable for the following components:      Result Value   Glucose, Bld 115 (*)    Calcium  8.0 (*)    Total Protein 4.3 (*)    Albumin  1.8 (*)    All other components within normal limits  CBC WITH DIFFERENTIAL/PLATELET - Abnormal; Notable for the following components:   WBC 14.4 (*)    RBC 3.64 (*)    Hemoglobin 11.0 (*)    HCT 33.5 (*)    Neutro Abs 13.3 (*)    Lymphs Abs 0.2 (*)    Abs Immature Granulocytes 0.10 (*)    All other components within normal limits  PROTIME-INR - Abnormal; Notable for the following components:   Prothrombin Time 15.7 (*)    All other components within normal limits  URINALYSIS, W/ REFLEX TO  CULTURE (INFECTION SUSPECTED) - Abnormal; Notable for the following components:   Color, Urine YELLOW (*)    APPearance CLEAR (*)    Glucose, UA >=500 (*)    All other components within normal limits  CK - Abnormal; Notable for the following components:   Total CK 47 (*)    All other components within normal limits  TROPONIN I (HIGH SENSITIVITY) - Abnormal; Notable for the following components:   Troponin I (High Sensitivity) 189 (*)    All other components within normal limits  TROPONIN I (HIGH SENSITIVITY) - Abnormal; Notable for the following components:   Troponin I (High Sensitivity) 187 (*)    All other components within normal limits  RESP PANEL BY RT-PCR (RSV, FLU A&B, COVID)  RVPGX2  CULTURE, BLOOD (ROUTINE X 2)  CULTURE, BLOOD (ROUTINE X 2)  LACTIC ACID, PLASMA     EKG EKG independently reviewed and interpreted by myself demonstrates:    RADIOLOGY Imaging independently reviewed and interpreted by myself demonstrates:   Formal Radiology Read:  CT Angio Chest PE W and/or Wo Contrast Result Date:  09/21/2023 CLINICAL DATA:  Pulmonary embolism (PE) suspected, high prob. Weakness. EXAM: CT ANGIOGRAPHY CHEST WITH CONTRAST TECHNIQUE: Multidetector CT imaging of the chest was performed using the standard protocol during bolus administration of intravenous contrast. Multiplanar CT image reconstructions and MIPs were obtained to evaluate the vascular anatomy. RADIATION DOSE REDUCTION: This exam was performed according to the departmental dose-optimization program which includes automated exposure control, adjustment of the mA and/or kV according to patient size and/or use of iterative reconstruction technique. CONTRAST:  75mL OMNIPAQUE  IOHEXOL  350 MG/ML SOLN COMPARISON:  10/25/2022 FINDINGS: Cardiovascular: Cardiomegaly. Small pericardial effusion. Three-vessel coronary artery disease. Aortic atherosclerosis. No evidence of aortic aneurysm. No filling defects in the pulmonary arteries to suggest pulmonary emboli. Mediastinum/Nodes: No mediastinal, hilar, or axillary adenopathy. Trachea and esophagus are unremarkable. Thyroid unremarkable. Lungs/Pleura: Dependent atelectasis in the lung bases. Mild airway thickening in the lower lobes. No confluent opacities or effusions. Upper Abdomen: No acute findings Musculoskeletal: Chest wall soft tissues are unremarkable. No acute bony abnormality. Review of the MIP images confirms the above findings. IMPRESSION: Cardiomegaly, three-vessel coronary artery disease. Small pericardial effusion. No evidence of pulmonary embolus. Mild airway thickening in the lower lobes bilaterally. Bibasilar atelectasis. Aortic Atherosclerosis (ICD10-I70.0). Electronically Signed   By: Franky Crease M.D.   On: 09/21/2023 17:15   DG Chest Port 1 View Result Date: 09/21/2023 CLINICAL DATA:  Questionable sepsis - evaluate for abnormality EXAM: PORTABLE CHEST - 1 VIEW COMPARISON:  12/15/2022 FINDINGS: Mild alveolar opacities in both lung bases. Heart size upper limits normal for technique. Aortic  Atherosclerosis (ICD10-170.0). No effusion. Visualized bones unremarkable. IMPRESSION: Mild bibasilar alveolar opacities. Electronically Signed   By: JONETTA Faes M.D.   On: 09/21/2023 16:18    PROCEDURES:  Critical Care performed: Yes, see critical care procedure note(s)  CRITICAL CARE Performed by: Nilsa Dade   Total critical care time: 32 minutes  Critical care time was exclusive of separately billable procedures and treating other patients.  Critical care was necessary to treat or prevent imminent or life-threatening deterioration.  Critical care was time spent personally by me on the following activities: development of treatment plan with patient and/or surrogate as well as nursing, discussions with consultants, evaluation of patient's response to treatment, examination of patient, obtaining history from patient or surrogate, ordering and performing treatments and interventions, ordering and review of laboratory studies, ordering and review of radiographic studies, pulse oximetry  and re-evaluation of patient's condition.   Procedures   MEDICATIONS ORDERED IN ED: Medications  aspirin  tablet 325 mg (has no administration in time range)  sodium chloride  0.9 % bolus 1,000 mL (0 mLs Intravenous Stopped 09/21/23 1758)  cefTRIAXone  (ROCEPHIN ) 2 g in sodium chloride  0.9 % 100 mL IVPB (0 g Intravenous Stopped 09/21/23 1758)  acetaminophen  (TYLENOL ) tablet 650 mg (650 mg Oral Given 09/21/23 1618)  iohexol  (OMNIPAQUE ) 350 MG/ML injection 75 mL (75 mLs Intravenous Contrast Given 09/21/23 1654)     IMPRESSION / MDM / ASSESSMENT AND PLAN / ED COURSE  I reviewed the triage vital signs and the nursing notes.  Differential diagnosis includes, but is not limited to, infection including pneumonia, UTI, no obvious cellulitis on exam, no headache, neck pain, focal deficits suggestive of meningitis or encephalitis, anemia, electrolyte abnormality, heat related illness, rhabdomyolysis  Patient's  presentation is most consistent with acute presentation with potential threat to life or bodily function.  83 year old male presenting with weakness.  Febrile prior to presentation, tachycardic here.  Reportedly hypoxic on room air prior to presentation, but able to wean to room air here.  No obvious infectious symptoms, but with fever, tachycardia, tachypnea, sepsis orders were initiated with empiric Rocephin .  Labs with leukocytosis with WC of 14.4, mild anemia.  CMP without critical derangements.  Normal lactate.  Viral swab negative.  Urine without evidence of infection.  Troponin elevated at 187.  X-Giang Hemme with questionable pneumonia at bilateral bases.  With elevated troponin and without obvious focal consolidation, CTA chest was obtained to further evaluate for PE.  This was without evidence of PE, demonstrated bibasilar atelectasis.  Patient reassessed.  Continues to deny headache, abdominal pain, chest pain, somewhat unclear source of fever, but with associated tachycardia, white blood cell count, elevated troponin, do think he is appropriate for admission.  Will reach out to hospitalist team.  Clinical Course as of 09/21/23 1946  Sat Sep 21, 2023  1921 Case discussed with Dr. Hilma.  He will evaluate for anticipated admission. [NR]    Clinical Course User Index [NR] Levander Slate, MD     FINAL CLINICAL IMPRESSION(S) / ED DIAGNOSES   Final diagnoses:  Generalized weakness  Elevated troponin  Fever, unspecified fever cause     Rx / DC Orders   ED Discharge Orders     None        Note:  This document was prepared using Dragon voice recognition software and may include unintentional dictation errors.   Levander Slate, MD 09/21/23 [phone]

## 2023-09-21 NOTE — Progress Notes (Signed)
 CODE SEPSIS - PHARMACY COMMUNICATION  **Broad Spectrum Antibiotics should be administered within 1 hour of Sepsis diagnosis**  Time Code Sepsis Called/Page Received: 1551  Antibiotics Ordered: ceftriaxone   Time of 1st antibiotic administration: 1614  Additional action taken by pharmacy: None  If necessary, Name of Provider/Nurse Contacted: None    Damien Napoleon ,PharmD Clinical Pharmacist  09/21/2023  3:52 PM

## 2023-09-21 NOTE — H&P (Incomplete)
 History and Physical    Mason Hill FMW:[id number] DOB: 1940/07/20 DOA: 09/21/2023  Referring MD/NP/PA:   PCP: Mason Krystal Kotyk, MD   Patient coming from:  The patient is coming from home.     Chief Complaint: weakness, fever, mild cough  HPI: Mason Hill is a 83 y.o. male with medical history significant of mitral valve endocarditis,  HTN, HLD, DM, CAD, MI, s/p of stent, dCHF, CKD-3a, A-fib on Eliquis , pericardial effusion, and SVT, who presents with weakness, fever, mild cough.  Patient states his symptoms started today.  He states that he feels generalized weak, which has been progressively worsening.  Per EMS report, patient was sitting in his car due to weakness.  He states he has mild dry cough, denies SOB and chest pain. Patient does not use oxygen normally, but was found to have oxygen desaturation to 83-89% on room air, which improved to 95% on 4L of oxygen. He had fever 102.8 per EMS.  His temperature is 100.5 in the ED. Had ice packs placed and received 1 L of IV fluids per EMS.   Denies nausea, vomiting, diarrhea or abdominal pain.  Denies symptoms of UTI.  Patient states that he was working in the heat yesterday, but denies heat exposure today.   Data reviewed independently and ED Course: pt was found to have WBC 14.4, negative PCR for COVID, flu and RSV, lactic acid 1.8, troponin 189, 187, 190, negative UA, GFR> 60, CKD 47.  Temperature 100.5, blood pressure 127/54 -->  88/62 which improved to 105/57 after giving IV fluid, albumin  midodrine  and Solu-Cortef  in ED.  Chest x-ray showed mild bibasilar alveolar opacities.  CTA negative for PE.  Patient is admitted to PCU as inpatient.  CTA: Cardiomegaly, three-vessel coronary artery disease. Small pericardial effusion.   No evidence of pulmonary embolus.   Mild airway thickening in the lower lobes bilaterally. Bibasilar atelectasis.   Aortic Atherosclerosis (ICD10-I70.0).    EKG: I have personally reviewed.  A-fib,  QTc 505, LAD, low voltage, poor R wave progression.   Review of Systems:   General: no fevers, chills, no body weight gain, has fatigue HEENT: no blurry vision, hearing changes or sore throat Respiratory: no dyspnea, has coughing, no wheezing CV: no chest pain, no palpitations GI: no nausea, vomiting, abdominal pain, diarrhea, constipation GU: no dysuria, burning on urination, increased urinary frequency, hematuria  Ext: has leg edema Neuro: no unilateral weakness, numbness, or tingling, no vision change or hearing loss Skin: no rash, no skin tear. MSK: No muscle spasm, no deformity, no limitation of range of movement in spin Heme: No easy bruising.  Travel history: No recent long distant travel.   Allergy:  Allergies  Allergen Reactions   Codeine Other (See Comments) and Rash    Other Reaction: BLISTERING, PEELING.   Erythromycin    Isosorbide Nitrate     Other reaction(s): Headache    Past Medical History:  Diagnosis Date   A-fib (HCC)    AMI (acute myocardial infarction) (HCC)    Diabetes mellitus without complication (HCC)    Hypertension     Past Surgical History:  Procedure Laterality Date   HERNIA REPAIR     TEE WITHOUT CARDIOVERSION N/A 10/30/2022   Procedure: TRANSESOPHAGEAL ECHOCARDIOGRAM;  Surgeon: Mason Slain, MD;  Location: [facility] INVASIVE CV LAB;  Service: Cardiovascular;  Laterality: N/A;    Social History:  reports that he has never smoked. He has never used smokeless tobacco. He reports that he does not drink alcohol and  does not use drugs.  Family History: History reviewed. No pertinent family history.   Prior to Admission medications   Medication Sig Start Date End Date Taking? Authorizing Provider  acetaminophen  (TYLENOL ) 500 MG tablet Take 1,000 mg by mouth once as needed for moderate pain (pain score 4-6).    [provider]  apixaban  (ELIQUIS ) 5 MG TABS tablet Take 1 tablet (5 mg total) by mouth 2 (two) times daily. 11/03/22    Elgergawy, Brayton RAMAN, MD  atorvastatin  (LIPITOR ) 40 MG tablet Take 1 tablet (40 mg total) by mouth daily. 11/04/22   Elgergawy, Brayton RAMAN, MD  furosemide  (LASIX ) 40 MG tablet Take 1 tablet (40 mg total) by mouth 2 (two) times daily. 11/03/22 11/03/23  Elgergawy, Brayton RAMAN, MD  hydrocerin (EUCERIN) CREA Apply 1 Application topically daily. 12/20/22   Dameron, Marisa, DO  JARDIANCE  10 MG TABS tablet Take 10 mg by mouth daily. 06/28/22   [provider]  Multiple Vitamin (MULTIVITAMIN WITH MINERALS) TABS tablet Take 1 tablet by mouth daily. 12/20/22   Dameron, Marisa, DO  Nutritional Supplements (,FEEDING SUPPLEMENT, PROSOURCE PLUS) liquid Take 30 mLs by mouth 2 (two) times daily between meals. 12/19/22   Dameron, Marisa, DO  oxyCODONE  (OXY IR/ROXICODONE ) 5 MG immediate release tablet Take 1 tablet (5 mg total) by mouth every 6 (six) hours as needed for severe pain (pain score 7-10). 12/19/22   Donah Laymon PARAS, MD  simethicone  (MYLICON) 80 MG chewable tablet Chew 1 tablet (80 mg total) by mouth every 6 (six) hours as needed for flatulence. 12/19/22   Dameron, Marisa, DO  spironolactone  (ALDACTONE ) 50 MG tablet Take 0.5 tablets (25 mg total) by mouth daily. 11/04/22   Elgergawy, Brayton RAMAN, MD    Physical Exam: Vitals:   09/21/23 1930 09/21/23 2011 09/21/23 2028 09/21/23 2340  BP: (!) 98/57  (!) 88/62 (!) 105/57  Pulse: (!) 113  (!) 119 62  Resp:   16 18  Temp:  98.2 F (36.8 C) 98.2 F (36.8 C) 98.6 F (37 C)  TempSrc:  Oral    SpO2:   93% 97%  Weight:   86.4 kg   Height:   6' (1.829 m)    General: Not in acute distress HEENT:       Eyes: PERRL, EOMI, no jaundice       ENT: No discharge from the ears and nose, no pharynx injection, no tonsillar enlargement.        Neck: No JVD, no bruit, no mass felt. Heme: No neck lymph node enlargement. Cardiac: S1/S2, RRR, No murmurs, No gallops or rubs. Respiratory: No rales, wheezing, rhonchi or rubs. GI: Soft, nondistended, nontender, no rebound  pain, no organomegaly, BS present. GU: No hematuria Ext: 1+ pitting leg edema bilaterally. 1+DP/PT pulse bilaterally. Musculoskeletal: No joint deformities, No joint redness or warmth, no limitation of ROM in spin. Skin: No rashes.  Neuro: Alert, oriented X3, cranial nerves II-XII grossly intact, moves all extremities normally.  Psych: Patient is not psychotic, no suicidal or hemocidal ideation.  Labs on Admission: I have personally reviewed following labs and imaging studies  CBC: Recent Labs  Lab 09/21/23 1550  WBC 14.4*  NEUTROABS 13.3*  HGB 11.0*  HCT 33.5*  MCV 92.0  PLT 242   Basic Metabolic Panel: Recent Labs  Lab 09/21/23 1550  NA 141  K 4.0  CL 107  CO2 26  GLUCOSE 115*  BUN 17  CREATININE 1.16  CALCIUM  8.0*  MG 1.7   GFR: Estimated  Creatinine Clearance: 53.9 mL/min (by C-G formula based on SCr of 1.16 mg/dL). Liver Function Tests: Recent Labs  Lab 09/21/23 1550  AST 24  ALT 12  ALKPHOS 70  BILITOT 1.0  PROT 4.3*  ALBUMIN  1.8*   No results for input(s): LIPASE, AMYLASE in the last 168 hours. No results for input(s): AMMONIA in the last 168 hours. Coagulation Profile: Recent Labs  Lab 09/21/23 1550  INR 1.2   Cardiac Enzymes: Recent Labs  Lab 09/21/23 1550  CKTOTAL 47*   BNP (last 3 results) No results for input(s): PROBNP in the last 8760 hours. HbA1C: Recent Labs    09/21/23 2144  HGBA1C 6.1*   CBG: Recent Labs  Lab 09/21/23 2211  GLUCAP 135*   Lipid Profile: Recent Labs    09/21/23 1550  CHOL 151  HDL 48  LDLCALC 94  TRIG 43  CHOLHDL 3.1   Thyroid Function Tests: No results for input(s): TSH, T4TOTAL, FREET4, T3FREE, THYROIDAB in the last 72 hours. Anemia Panel: No results for input(s): VITAMINB12, FOLATE, FERRITIN, TIBC, IRON, RETICCTPCT in the last 72 hours. Urine analysis:    Component Value Date/Time   COLORURINE YELLOW (A) 09/21/2023 1550   APPEARANCEUR CLEAR (A) 09/21/2023 1550    LABSPEC 1.021 09/21/2023 1550   PHURINE 6.0 09/21/2023 1550   GLUCOSEU >=500 (A) 09/21/2023 1550   HGBUR NEGATIVE 09/21/2023 1550   BILIRUBINUR NEGATIVE 09/21/2023 1550   KETONESUR NEGATIVE 09/21/2023 1550   PROTEINUR NEGATIVE 09/21/2023 1550   NITRITE NEGATIVE 09/21/2023 1550   LEUKOCYTESUR NEGATIVE 09/21/2023 1550   Sepsis Labs: @LABRCNTIP (procalcitonin:4,lacticidven:4) ) Recent Results (from the past 240 hours)  Resp panel by RT-PCR (RSV, Flu A&B, Covid) Anterior Nasal Swab     Status: None   Collection Time: 09/21/23  3:50 PM   Specimen: Anterior Nasal Swab  Result Value Ref Range Status   SARS Coronavirus 2 by RT PCR NEGATIVE NEGATIVE Final    Comment: (NOTE) SARS-CoV-2 target nucleic acids are NOT DETECTED.  The SARS-CoV-2 RNA is generally detectable in upper respiratory specimens during the acute phase of infection. The lowest concentration of SARS-CoV-2 viral copies this assay can detect is 138 copies/mL. A negative result does not preclude SARS-Cov-2 infection and should not be used as the sole basis for treatment or other patient management decisions. A negative result may occur with  improper specimen collection/handling, submission of specimen other than nasopharyngeal swab, presence of viral mutation(s) within the areas targeted by this assay, and inadequate number of viral copies(<138 copies/mL). A negative result must be combined with clinical observations, patient history, and epidemiological information. The expected result is Negative.  Fact Sheet for Patients:  BloggerCourse.com  Fact Sheet for Healthcare Providers:  SeriousBroker.it  This test is no t yet approved or cleared by the [place]  FDA and  has been authorized for detection and/or diagnosis of SARS-CoV-2 by FDA under an Emergency Use Authorization (EUA). This EUA will remain  in effect (meaning this test can be used) for the duration of  the COVID-19 declaration under Section 564(b)(1) of the Act, 21 U.S.C.section 360bbb-3(b)(1), unless the authorization is terminated  or revoked sooner.       Influenza A by PCR NEGATIVE NEGATIVE Final   Influenza B by PCR NEGATIVE NEGATIVE Final    Comment: (NOTE) The Xpert Xpress SARS-CoV-2/FLU/RSV plus assay is intended as an aid in the diagnosis of influenza from Nasopharyngeal swab specimens and should not be used as a sole basis for treatment. Nasal washings and aspirates are  unacceptable for Xpert Xpress SARS-CoV-2/FLU/RSV testing.  Fact Sheet for Patients: BloggerCourse.com  Fact Sheet for Healthcare Providers: SeriousBroker.it  This test is not yet approved or cleared by the [place]  FDA and has been authorized for detection and/or diagnosis of SARS-CoV-2 by FDA under an Emergency Use Authorization (EUA). This EUA will remain in effect (meaning this test can be used) for the duration of the COVID-19 declaration under Section 564(b)(1) of the Act, 21 U.S.C. section 360bbb-3(b)(1), unless the authorization is terminated or revoked.     Resp Syncytial Virus by PCR NEGATIVE NEGATIVE Final    Comment: (NOTE) Fact Sheet for Patients: BloggerCourse.com  Fact Sheet for Healthcare Providers: SeriousBroker.it  This test is not yet approved or cleared by the [place]  FDA and has been authorized for detection and/or diagnosis of SARS-CoV-2 by FDA under an Emergency Use Authorization (EUA). This EUA will remain in effect (meaning this test can be used) for the duration of the COVID-19 declaration under Section 564(b)(1) of the Act, 21 U.S.C. section 360bbb-3(b)(1), unless the authorization is terminated or revoked.  Performed at [facility], [address], [place], [place] [address]      Radiological Exams on Admission:   Assessment/Plan Principal  Problem:   CAP (community acquired pneumonia) Active Problems:   Sepsis (HCC)   CAD (coronary artery disease)   Myocardial injury   Chronic diastolic CHF (congestive heart failure) (HCC)   Essential hypertension   HLD (hyperlipidemia)   Paroxysmal atrial fibrillation (HCC)   Type II diabetes mellitus with renal manifestations (HCC)   Chronic kidney disease, stage 3a (HCC)   Overweight (BMI 25.0-29.9)   Assessment and Plan:  Possible CAP (community acquired pneumonia) and sepsis: Patient has dry cough.  He denies SOB, but was found to have oxygen desaturation to 83-89% on room air.  Normally he is not using oxygen.  CTA negative for PE.  Chest x-ray showed mild bibasilar alveolar opacity, indicating possible pneumonia. Pt meets criteria for sepsis with WBC 14.4, temperature 100.5 in ED, heart rate 114, RR 30.  Lactic acid normal 1.8.  Patient had hypotension which has improved with treatment.  Since patient has history of mitral valve endocarditis, will start antibiotics with broad coverage.   - Will admit to PCU bed   as inpt - Midodrine  10 mg 3 times daily - So Cortef  100 mg - Albumin  12.5 g - Hold blood pressure medications - IV Vancomycin  and cefepime  (received 1 dose of Rocephin  in ED). - Mucinex  for cough  - Bronchodilators - Urine legionella and S. pneumococcal antigen - Follow up blood culture x2, sputum culture -Trend lactic acid level per sepsis protocol - IVF:  1.5 of NS bolus in ED (pt had received 1 L IV fluid by EMS), followed by 75 mL per hour of NS (patient has CHF, limiting aggressive IV fluids treatment)  Sepsis (HCC): Due to possible CAP, but cannot completely rule out the possibility of the recurrent endocarditis. -see above  CAD (coronary artery disease) and Myocardial injury: s/p of stent. Trop  189 --> 187 --> 190. No CP.  Likely demand ischemia. - Patient is on Eliquis , will not give aspirin  - Continue Plavix  - Lipitor  - Trend troponin level  Chronic  diastolic CHF (congestive heart failure) (HCC): 2D echo on 12/16/2022 showed EF of 60-85%.  Patient has 1+ leg edema, BNP 182, no pulm edema on chest x-ray.  Does not seem to have CHF exacerbation.  Obesity patient is at high risk of developing  CHF exacerbation. - Hold torsemide , spironolactone  due to sepsis and hypotension - Watch volume status closely.  Essential hypertension -Hold diuretics and blood pressure medications due to sepsis and hypotension - IV hydralazine as needed  HLD (hyperlipidemia) -Lipitor   Paroxysmal atrial fibrillation [facility]): currently heart rate 114 --> 90s -Continue Eliquis   Type II diabetes mellitus with renal manifestations (HCC): Recent A1c 6.0, well-controlled.  Patient is taking Jardiance  -Sliding scale insulin   Chronic kidney disease, stage 3a [facility]): Renal function stable.  GFR> 60 today -Follow-up with BMP  Overweight (BMI 25.0-29.9): Body weight 86.4 kg, BMI 25.82 - Encourage losing weight - Exercise healthy diet       DVT ppx: on Eliquis   Code Status: Full code    Family Communication:     not done, no family member is at bed side.      Disposition Plan:  Anticipate discharge back to previous environment  Consults called:  none  Admission status and Level of care: Progressive:  as inpt        Dispo: The patient is from: Home              Anticipated d/c is to: Home              Anticipated d/c date is: 2 days              Patient currently is not medically stable to d/c.    Severity of Illness:  The appropriate patient status for this patient is INPATIENT. Inpatient status is judged to be reasonable and necessary in order to provide the required intensity of service to ensure the patient's safety. The patient's presenting symptoms, physical exam findings, and initial radiographic and laboratory data in the context of their chronic comorbidities is felt to place them at high risk for further clinical deterioration. Furthermore, it  is not anticipated that the patient will be medically stable for discharge from the hospital within 2 midnights of admission.   * I certify that at the point of admission it is my clinical judgment that the patient will require inpatient hospital care spanning beyond 2 midnights from the point of admission due to high intensity of service, high risk for further deterioration and high frequency of surveillance required.*       Date of Service 09/22/2023    Caleb Exon [facility]   If 7PM-7AM, please contact night-coverage [email or web address] 09/22/2023, 12:17 AM

## 2023-09-21 NOTE — ED Notes (Signed)
 DR Ray in room at time of triage and put in sepsis protocol orders in herself

## 2023-09-21 NOTE — H&P (Incomplete)
 History and Physical    Mason Hill FMW:[id number] DOB: [birth year] DOA: 09/21/2023  Referring MD/NP/PA:   PCP: Mason Krystal Kotyk, MD   Patient coming from:  The patient is coming from home.     Chief Complaint: weakness, fever, mild cough  HPI: Mason Hill is a 83 y.o. male with medical history significant of mitral valve endocarditis,  HTN, HLD, DM, CAD, MI, dCHF, CKD-3a, A-fib on Eliquis , pericardial effusion, and SVT, who presents with weakness, fever, mild cough.  Patient states his symptoms started today.  He states that he feels generalized weak, which has been progressively worsening.  Per EMS report, patient was sitting in his car due to weakness.  He states he has mild dry cough, denies SOB and chest pain. Patient does not use oxygen normally, but was found to have oxygen desaturation to 83-89% on room air, which improved to 95% on 4L of oxygen. He had fever 102.8 per EMS.  His temperature is 100.5 in the ED. Had ice packs placed and received 1 L of IV fluids per EMS.   Denies nausea, vomiting, diarrhea or abdominal pain.  Denies symptoms of UTI.  Patient states that he was working in the heat yesterday, but denies heat exposure today.   Data reviewed independently and ED Course: pt was found to have WBC 14.4, negative PCR for COVID, flu and RSV, lactic acid 1.8, troponin 189, 187, 190, negative UA, GFR> 60, CKD 47.  Temperature 100.5, blood pressure 127/54 -->  88/62 which improved to 105/57 after giving IV fluid, albumin  midodrine  and Solu-Cortef  in ED.  Chest x-ray showed mild bibasilar alveolar opacities.  CTA negative for PE.  Patient is admitted to PCU as inpatient.  CTA: Cardiomegaly, three-vessel coronary artery disease. Small pericardial effusion.   No evidence of pulmonary embolus.   Mild airway thickening in the lower lobes bilaterally. Bibasilar atelectasis.   Aortic Atherosclerosis (ICD10-I70.0).    EKG: I have personally reviewed.  A-fib, QTc 505, LAD,  low voltage, poor R wave progression.   Review of Systems:   General: no fevers, chills, no body weight gain, has fatigue HEENT: no blurry vision, hearing changes or sore throat Respiratory: no dyspnea, has coughing, no wheezing CV: no chest pain, no palpitations GI: no nausea, vomiting, abdominal pain, diarrhea, constipation GU: no dysuria, burning on urination, increased urinary frequency, hematuria  Ext: has leg edema Neuro: no unilateral weakness, numbness, or tingling, no vision change or hearing loss Skin: no rash, no skin tear. MSK: No muscle spasm, no deformity, no limitation of range of movement in spin Heme: No easy bruising.  Travel history: No recent long distant travel.   Allergy:  Allergies  Allergen Reactions  . Codeine Other (See Comments) and Rash    Other Reaction: BLISTERING, PEELING.  . Erythromycin   . Isosorbide Nitrate     Other reaction(s): Headache    Past Medical History:  Diagnosis Date  . A-fib (HCC)   . AMI (acute myocardial infarction) (HCC)   . Diabetes mellitus without complication (HCC)   . Hypertension     Past Surgical History:  Procedure Laterality Date  . HERNIA REPAIR    . TEE WITHOUT CARDIOVERSION N/A 10/30/2022   Procedure: TRANSESOPHAGEAL ECHOCARDIOGRAM;  Surgeon: Mason Slain, MD;  Location: [facility] INVASIVE CV LAB;  Service: Cardiovascular;  Laterality: N/A;    Social History:  reports that he has never smoked. He has never used smokeless tobacco. He reports that he does not drink alcohol and does not use  drugs.  Family History: History reviewed. No pertinent family history.   Prior to Admission medications   Medication Sig Start Date End Date Taking? Authorizing Provider  acetaminophen  (TYLENOL ) 500 MG tablet Take 1,000 mg by mouth once as needed for moderate pain (pain score 4-6).    [provider]  apixaban  (ELIQUIS ) 5 MG TABS tablet Take 1 tablet (5 mg total) by mouth 2 (two) times daily. 11/03/22    Hill, Mason RAMAN, MD  atorvastatin  (LIPITOR ) 40 MG tablet Take 1 tablet (40 mg total) by mouth daily. 11/04/22   Hill, Mason RAMAN, MD  furosemide  (LASIX ) 40 MG tablet Take 1 tablet (40 mg total) by mouth 2 (two) times daily. 11/03/22 11/03/23  Hill, Mason RAMAN, MD  hydrocerin (EUCERIN) CREA Apply 1 Application topically daily. 12/20/22   Hill, Marisa, DO  JARDIANCE  10 MG TABS tablet Take 10 mg by mouth daily. 06/28/22   [provider]  Multiple Vitamin (MULTIVITAMIN WITH MINERALS) TABS tablet Take 1 tablet by mouth daily. 12/20/22   Hill, Marisa, DO  Nutritional Supplements (,FEEDING SUPPLEMENT, PROSOURCE PLUS) liquid Take 30 mLs by mouth 2 (two) times daily between meals. 12/19/22   Hill, Marisa, DO  oxyCODONE  (OXY IR/ROXICODONE ) 5 MG immediate release tablet Take 1 tablet (5 mg total) by mouth every 6 (six) hours as needed for severe pain (pain score 7-10). 12/19/22   Mason Laymon PARAS, MD  simethicone  (MYLICON) 80 MG chewable tablet Chew 1 tablet (80 mg total) by mouth every 6 (six) hours as needed for flatulence. 12/19/22   Hill, Marisa, DO  spironolactone  (ALDACTONE ) 50 MG tablet Take 0.5 tablets (25 mg total) by mouth daily. 11/04/22   Hill, Mason RAMAN, MD    Physical Exam: Vitals:   09/21/23 1930 09/21/23 2011 09/21/23 2028 09/21/23 2340  BP: (!) 98/57  (!) 88/62 (!) 105/57  Pulse: (!) 113  (!) 119 62  Resp:   16 18  Temp:  98.2 F (36.8 C) 98.2 F (36.8 C) 98.6 F (37 C)  TempSrc:  Oral    SpO2:   93% 97%  Weight:   86.4 kg   Height:   6' (1.829 m)    General: Not in acute distress HEENT:       Eyes: PERRL, EOMI, no jaundice       ENT: No discharge from the ears and nose, no pharynx injection, no tonsillar enlargement.        Neck: No JVD, no bruit, no mass felt. Heme: No neck lymph node enlargement. Cardiac: S1/S2, RRR, No murmurs, No gallops or rubs. Respiratory: No rales, wheezing, rhonchi or rubs. GI: Soft, nondistended, nontender, no rebound  pain, no organomegaly, BS present. GU: No hematuria Ext: 1+ pitting leg edema bilaterally. 1+DP/PT pulse bilaterally. Musculoskeletal: No joint deformities, No joint redness or warmth, no limitation of ROM in spin. Skin: No rashes.  Neuro: Alert, oriented X3, cranial nerves II-XII grossly intact, moves all extremities normally.  Psych: Patient is not psychotic, no suicidal or hemocidal ideation.  Labs on Admission: I have personally reviewed following labs and imaging studies  CBC: Recent Labs  Lab 09/21/23 1550  WBC 14.4*  NEUTROABS 13.3*  HGB 11.0*  HCT 33.5*  MCV 92.0  PLT 242   Basic Metabolic Panel: Recent Labs  Lab 09/21/23 1550  NA 141  K 4.0  CL 107  CO2 26  GLUCOSE 115*  BUN 17  CREATININE 1.16  CALCIUM  8.0*  MG 1.7   GFR: Estimated Creatinine Clearance: 53.9  mL/min (by C-G formula based on SCr of 1.16 mg/dL). Liver Function Tests: Recent Labs  Lab 09/21/23 1550  AST 24  ALT 12  ALKPHOS 70  BILITOT 1.0  PROT 4.3*  ALBUMIN  1.8*   No results for input(s): LIPASE, AMYLASE in the last 168 hours. No results for input(s): AMMONIA in the last 168 hours. Coagulation Profile: Recent Labs  Lab 09/21/23 1550  INR 1.2   Cardiac Enzymes: Recent Labs  Lab 09/21/23 1550  CKTOTAL 47*   BNP (last 3 results) No results for input(s): PROBNP in the last 8760 hours. HbA1C: Recent Labs    09/21/23 2144  HGBA1C 6.1*   CBG: Recent Labs  Lab 09/21/23 2211  GLUCAP 135*   Lipid Profile: Recent Labs    09/21/23 1550  CHOL 151  HDL 48  LDLCALC 94  TRIG 43  CHOLHDL 3.1   Thyroid Function Tests: No results for input(s): TSH, T4TOTAL, FREET4, T3FREE, THYROIDAB in the last 72 hours. Anemia Panel: No results for input(s): VITAMINB12, FOLATE, FERRITIN, TIBC, IRON, RETICCTPCT in the last 72 hours. Urine analysis:    Component Value Date/Time   COLORURINE YELLOW (A) 09/21/2023 1550   APPEARANCEUR CLEAR (A) 09/21/2023 1550    LABSPEC 1.021 09/21/2023 1550   PHURINE 6.0 09/21/2023 1550   GLUCOSEU >=500 (A) 09/21/2023 1550   HGBUR NEGATIVE 09/21/2023 1550   BILIRUBINUR NEGATIVE 09/21/2023 1550   KETONESUR NEGATIVE 09/21/2023 1550   PROTEINUR NEGATIVE 09/21/2023 1550   NITRITE NEGATIVE 09/21/2023 1550   LEUKOCYTESUR NEGATIVE 09/21/2023 1550   Sepsis Labs: @LABRCNTIP (procalcitonin:4,lacticidven:4) ) Recent Results (from the past 240 hours)  Resp panel by RT-PCR (RSV, Flu A&B, Covid) Anterior Nasal Swab     Status: None   Collection Time: 09/21/23  3:50 PM   Specimen: Anterior Nasal Swab  Result Value Ref Range Status   SARS Coronavirus 2 by RT PCR NEGATIVE NEGATIVE Final    Comment: (NOTE) SARS-CoV-2 target nucleic acids are NOT DETECTED.  The SARS-CoV-2 RNA is generally detectable in upper respiratory specimens during the acute phase of infection. The lowest concentration of SARS-CoV-2 viral copies this assay can detect is 138 copies/mL. A negative result does not preclude SARS-Cov-2 infection and should not be used as the sole basis for treatment or other patient management decisions. A negative result may occur with  improper specimen collection/handling, submission of specimen other than nasopharyngeal swab, presence of viral mutation(s) within the areas targeted by this assay, and inadequate number of viral copies(<138 copies/mL). A negative result must be combined with clinical observations, patient history, and epidemiological information. The expected result is Negative.  Fact Sheet for Patients:  BloggerCourse.com  Fact Sheet for Healthcare Providers:  SeriousBroker.it  This test is no t yet approved or cleared by the [place]  FDA and  has been authorized for detection and/or diagnosis of SARS-CoV-2 by FDA under an Emergency Use Authorization (EUA). This EUA will remain  in effect (meaning this test can be used) for the duration of  the COVID-19 declaration under Section 564(b)(1) of the Act, 21 U.S.C.section 360bbb-3(b)(1), unless the authorization is terminated  or revoked sooner.       Influenza A by PCR NEGATIVE NEGATIVE Final   Influenza B by PCR NEGATIVE NEGATIVE Final    Comment: (NOTE) The Xpert Xpress SARS-CoV-2/FLU/RSV plus assay is intended as an aid in the diagnosis of influenza from Nasopharyngeal swab specimens and should not be used as a sole basis for treatment. Nasal washings and aspirates are unacceptable for Xpert  Xpress SARS-CoV-2/FLU/RSV testing.  Fact Sheet for Patients: BloggerCourse.com  Fact Sheet for Healthcare Providers: SeriousBroker.it  This test is not yet approved or cleared by the [place]  FDA and has been authorized for detection and/or diagnosis of SARS-CoV-2 by FDA under an Emergency Use Authorization (EUA). This EUA will remain in effect (meaning this test can be used) for the duration of the COVID-19 declaration under Section 564(b)(1) of the Act, 21 U.S.C. section 360bbb-3(b)(1), unless the authorization is terminated or revoked.     Resp Syncytial Virus by PCR NEGATIVE NEGATIVE Final    Comment: (NOTE) Fact Sheet for Patients: BloggerCourse.com  Fact Sheet for Healthcare Providers: SeriousBroker.it  This test is not yet approved or cleared by the [place]  FDA and has been authorized for detection and/or diagnosis of SARS-CoV-2 by FDA under an Emergency Use Authorization (EUA). This EUA will remain in effect (meaning this test can be used) for the duration of the COVID-19 declaration under Section 564(b)(1) of the Act, 21 U.S.C. section 360bbb-3(b)(1), unless the authorization is terminated or revoked.  Performed at [facility], [address], [place], [place] [address]      Radiological Exams on Admission:   Assessment/Plan Principal  Problem:   CAP (community acquired pneumonia) Active Problems:   Sepsis (HCC)   CAD (coronary artery disease)   Myocardial injury   Chronic diastolic CHF (congestive heart failure) (HCC)   Essential hypertension   HLD (hyperlipidemia)   Paroxysmal atrial fibrillation (HCC)   Type II diabetes mellitus with renal manifestations (HCC)   Chronic kidney disease, stage 3a (HCC)   Overweight (BMI 25.0-29.9)   Assessment and Plan:   Principal Problem:   CAP (community acquired pneumonia) Active Problems:   Sepsis (HCC)   CAD (coronary artery disease)   Myocardial injury   Chronic diastolic CHF (congestive heart failure) (HCC)   Essential hypertension   HLD (hyperlipidemia)   Paroxysmal atrial fibrillation (HCC)   Type II diabetes mellitus with renal manifestations (HCC)   Chronic kidney disease, stage 3a (HCC)   Overweight (BMI 25.0-29.9)       DVT ppx: on Eliquis   Code Status: Full code    Family Communication:     not done, no family member is at bed side.      Disposition Plan:  Anticipate discharge back to previous environment  Consults called:  none  Admission status and Level of care: Progressive:  as inpt        Dispo: The patient is from: Home              Anticipated d/c is to: Home              Anticipated d/c date is: 2 days              Patient currently is not medically stable to d/c.    Severity of Illness:  The appropriate patient status for this patient is INPATIENT. Inpatient status is judged to be reasonable and necessary in order to provide the required intensity of service to ensure the patient's safety. The patient's presenting symptoms, physical exam findings, and initial radiographic and laboratory data in the context of their chronic comorbidities is felt to place them at high risk for further clinical deterioration. Furthermore, it is not anticipated that the patient will be medically stable for discharge from the hospital within 2  midnights of admission.   * I certify that at the point of admission it is my clinical judgment that the  patient will require inpatient hospital care spanning beyond 2 midnights from the point of admission due to high intensity of service, high risk for further deterioration and high frequency of surveillance required.*       Date of Service 09/22/2023    Caleb Exon [facility]   If 7PM-7AM, please contact night-coverage [email or web address] 09/22/2023, 12:03 AM

## 2023-09-21 NOTE — Consult Note (Signed)
 Pharmacy Antibiotic Note  Mason Hill is a 83 y.o. male admitted on 09/21/2023 with sepsis. Pharmacy has been consulted for vancomycin  dosing.  Today, 09/21/2023:  Day 1 of vancomycin   WBC 14.4  Tmax 100.5 F SCr 1.16 today with estimated CrCl of 53.9 mL/min  Renal function appears to be around baseline  Received a one-time dose of ceftriaxone  2 gm in the ED  Plan:  Give vancomycin  1750 mg IV x 1 dose, followed by vancomycin  1500 mg IV Q24H Goal AUC 400-550 Estimated AUC 486.9 Estimated Cmin 11.4 SCr used 1.16  IBW used, Vd 0.72 (BMI 26.04)  Patient also receiving cefepime  2 gm IV Q12H based on current renal function Pharmacy will continue to monitor and dose adjust appropriately   Height: 6' (182.9 cm) Weight: 87.1 kg (192 lb) IBW/kg (Calculated) : 77.6  Temp (24hrs), Avg:99.9 F (37.7 C), Min:98.9 F (37.2 C), Max:100.5 F (38.1 C)  Recent Labs  Lab 09/21/23 1550  WBC 14.4*  CREATININE 1.16  LATICACIDVEN 1.8    Estimated Creatinine Clearance: 53.9 mL/min (by C-G formula based on SCr of 1.16 mg/dL).    Allergies  Allergen Reactions   Codeine Other (See Comments) and Rash    Other Reaction: BLISTERING, PEELING.   Erythromycin    Isosorbide Nitrate     Other reaction(s): Headache   Antimicrobials this admission: 7/26 ceftriaxone  x 1 dose 7/26 cefepime  >>  7/26 vancomycin  >>   Dose adjustments this admission:  Microbiology results: 7/26 BCx: sent  Thank you for allowing pharmacy to be a part of this patient's care.  Patsye Sullivant, PharmD Clinical Pharmacist  09/21/2023 7:46 PM

## 2023-09-21 NOTE — Progress Notes (Signed)
 Elink following for sepsis protocol.

## 2023-09-22 DIAGNOSIS — B961 Klebsiella pneumoniae [K. pneumoniae] as the cause of diseases classified elsewhere: Secondary | ICD-10-CM | POA: Diagnosis not present

## 2023-09-22 DIAGNOSIS — I5032 Chronic diastolic (congestive) heart failure: Secondary | ICD-10-CM | POA: Diagnosis not present

## 2023-09-22 DIAGNOSIS — J189 Pneumonia, unspecified organism: Secondary | ICD-10-CM | POA: Diagnosis not present

## 2023-09-22 DIAGNOSIS — D72829 Elevated white blood cell count, unspecified: Secondary | ICD-10-CM | POA: Diagnosis not present

## 2023-09-22 DIAGNOSIS — I890 Lymphedema, not elsewhere classified: Secondary | ICD-10-CM

## 2023-09-22 DIAGNOSIS — A419 Sepsis, unspecified organism: Secondary | ICD-10-CM | POA: Diagnosis not present

## 2023-09-22 DIAGNOSIS — R7881 Bacteremia: Secondary | ICD-10-CM | POA: Diagnosis not present

## 2023-09-22 LAB — BASIC METABOLIC PANEL WITH GFR
Anion gap: 7 (ref 5–15)
BUN: 20 mg/dL (ref 8–23)
CO2: 23 mmol/L (ref 22–32)
Calcium: 7.7 mg/dL — ABNORMAL LOW (ref 8.9–10.3)
Chloride: 107 mmol/L (ref 98–111)
Creatinine, Ser: 1.05 mg/dL (ref 0.61–1.24)
GFR, Estimated: >60 mL/min (ref >60)
Glucose, Bld: 207 mg/dL — ABNORMAL HIGH (ref 70–99)
Potassium: 4.4 mmol/L (ref 3.5–5.1)
Sodium: 137 mmol/L (ref 135–145)

## 2023-09-22 LAB — STREP PNEUMONIAE URINARY ANTIGEN: Strep Pneumo Urinary Antigen: NEGATIVE

## 2023-09-22 LAB — BLOOD CULTURE ID PANEL (REFLEXED) - BCID2

## 2023-09-22 LAB — CBC
HCT: 34.8 % — ABNORMAL LOW (ref 39.0–52.0)
Hemoglobin: 11.2 g/dL — ABNORMAL LOW (ref 13.0–17.0)
MCH: 29.7 pg (ref 26.0–34.0)
MCHC: 32.2 g/dL (ref 30.0–36.0)
MCV: 92.3 fL (ref 80.0–100.0)
Platelets: 234 K/uL (ref 150–400)
RBC: 3.77 MIL/uL — ABNORMAL LOW (ref 4.22–5.81)
RDW: 14.9 % (ref 11.5–15.5)
WBC: 29.1 K/uL — ABNORMAL HIGH (ref 4.0–10.5)
nRBC: 0 % (ref 0.0–0.2)

## 2023-09-22 LAB — TROPONIN I (HIGH SENSITIVITY)
Troponin I (High Sensitivity): 164 ng/L (ref <18)
Troponin I (High Sensitivity): 193 ng/L (ref <18)

## 2023-09-22 LAB — GLUCOSE, CAPILLARY
Glucose-Capillary: 163 mg/dL — ABNORMAL HIGH (ref 70–99)
Glucose-Capillary: 132 mg/dL — ABNORMAL HIGH (ref 70–99)
Glucose-Capillary: 167 mg/dL — ABNORMAL HIGH (ref 70–99)
Glucose-Capillary: 123 mg/dL — ABNORMAL HIGH (ref 70–99)

## 2023-09-22 MED ORDER — SODIUM CHLORIDE 0.9 % IV SOLN
2.0000 g | INTRAVENOUS | Status: DC
  Administered 2023-09-22 – 2023-09-26 (×5): 2 g via INTRAVENOUS
  Filled 2023-09-22 (×5): qty 20

## 2023-09-22 MED ORDER — CLOPIDOGREL BISULFATE 75 MG PO TABS
75.0000 mg | ORAL_TABLET | Freq: Every day | ORAL | Status: DC
  Administered 2023-09-22 – 2023-09-26 (×5): 75 mg via ORAL
  Filled 2023-09-22 (×5): qty 1

## 2023-09-22 MED ORDER — APIXABAN 5 MG PO TABS
5.0000 mg | ORAL_TABLET | Freq: Two times a day (BID) | ORAL | Status: DC
Start: 2023-09-22 — End: 2023-09-26
  Administered 2023-09-22 – 2023-09-26 (×10): 5 mg via ORAL
  Filled 2023-09-22 (×10): qty 1

## 2023-09-22 MED ORDER — OXYCODONE HCL 5 MG PO TABS: 5.0000 mg | ORAL_TABLET | Freq: Four times a day (QID) | ORAL | Status: DC | PRN

## 2023-09-22 MED ORDER — ATORVASTATIN CALCIUM 80 MG PO TABS
80.0000 mg | ORAL_TABLET | Freq: Every day | ORAL | Status: DC
  Administered 2023-09-22 – 2023-09-26 (×5): 80 mg via ORAL
  Filled 2023-09-22 (×5): qty 1

## 2023-09-22 NOTE — Progress Notes (Signed)
 PHARMACY - PHYSICIAN COMMUNICATION CRITICAL VALUE ALERT - BLOOD CULTURE IDENTIFICATION (BCID)  Results for orders placed or performed during the hospital encounter of 09/21/23  Resp panel by RT-PCR (RSV, Flu A&B, Covid) Anterior Nasal Swab     Status: None   Collection Time: 09/21/23  3:50 PM   Specimen: Anterior Nasal Swab  Result Value Ref Range Status   SARS Coronavirus 2 by RT PCR NEGATIVE NEGATIVE Final    Comment: (NOTE) SARS-CoV-2 target nucleic acids are NOT DETECTED.  The SARS-CoV-2 RNA is generally detectable in upper respiratory specimens during the acute phase of infection. The lowest concentration of SARS-CoV-2 viral copies this assay can detect is 138 copies/mL. A negative result does not preclude SARS-Cov-2 infection and should not be used as the sole basis for treatment or other patient management decisions. A negative result may occur with  improper specimen collection/handling, submission of specimen other than nasopharyngeal swab, presence of viral mutation(s) within the areas targeted by this assay, and inadequate number of viral copies(<138 copies/mL). A negative result must be combined with clinical observations, patient history, and epidemiological information. The expected result is Negative.  Fact Sheet for Patients:  BloggerCourse.com  Fact Sheet for Healthcare Providers:  SeriousBroker.it  This test is no t yet approved or cleared by the [place]  FDA and  has been authorized for detection and/or diagnosis of SARS-CoV-2 by FDA under an Emergency Use Authorization (EUA). This EUA will remain  in effect (meaning this test can be used) for the duration of the COVID-19 declaration under Section 564(b)(1) of the Act, 21 U.S.C.section 360bbb-3(b)(1), unless the authorization is terminated  or revoked sooner.       Influenza A by PCR NEGATIVE NEGATIVE Final   Influenza B by PCR NEGATIVE NEGATIVE Final     Comment: (NOTE) The Xpert Xpress SARS-CoV-2/FLU/RSV plus assay is intended as an aid in the diagnosis of influenza from Nasopharyngeal swab specimens and should not be used as a sole basis for treatment. Nasal washings and aspirates are unacceptable for Xpert Xpress SARS-CoV-2/FLU/RSV testing.  Fact Sheet for Patients: BloggerCourse.com  Fact Sheet for Healthcare Providers: SeriousBroker.it  This test is not yet approved or cleared by the [place]  FDA and has been authorized for detection and/or diagnosis of SARS-CoV-2 by FDA under an Emergency Use Authorization (EUA). This EUA will remain in effect (meaning this test can be used) for the duration of the COVID-19 declaration under Section 564(b)(1) of the Act, 21 U.S.C. section 360bbb-3(b)(1), unless the authorization is terminated or revoked.     Resp Syncytial Virus by PCR NEGATIVE NEGATIVE Final    Comment: (NOTE) Fact Sheet for Patients: BloggerCourse.com  Fact Sheet for Healthcare Providers: SeriousBroker.it  This test is not yet approved or cleared by the [place]  FDA and has been authorized for detection and/or diagnosis of SARS-CoV-2 by FDA under an Emergency Use Authorization (EUA). This EUA will remain in effect (meaning this test can be used) for the duration of the COVID-19 declaration under Section 564(b)(1) of the Act, 21 U.S.C. section 360bbb-3(b)(1), unless the authorization is terminated or revoked.  Performed at [facility], [address] [place], [place] [address]   Blood Culture (routine x 2)     Status: None (Preliminary result)   Collection Time: 09/21/23  3:50 PM   Specimen: BLOOD RIGHT HAND  Result Value Ref Range Status   Specimen Description BLOOD RIGHT HAND  Final   Special Requests   Final    BOTTLES DRAWN AEROBIC  AND ANAEROBIC Blood Culture adequate volume   Culture   Setup Time   Final    IN BOTH AEROBIC AND ANAEROBIC BOTTLES Organism ID to follow GRAM NEGATIVE RODS CRITICAL RESULT CALLED TO, READ BACK BY AND VERIFIED WITH: Crandall Harvel, PHARMACY 07//27/2025 JL Performed at [facility], [address] [place], [place] [address]    Culture GRAM NEGATIVE RODS  Final   Report Status PENDING  Incomplete  Blood Culture (routine x 2)     Status: None (Preliminary result)   Collection Time: 09/21/23  3:50 PM   Specimen: BLOOD  Result Value Ref Range Status   Specimen Description BLOOD LEFT ANTECUBITAL  Final   Special Requests   Final    BOTTLES DRAWN AEROBIC AND ANAEROBIC Blood Culture adequate volume   Culture  Setup Time   Final    GRAM NEGATIVE RODS IN BOTH AEROBIC AND ANAEROBIC BOTTLES CRITICAL VALUE NOTED.  VALUE IS CONSISTENT WITH PREVIOUSLY REPORTED AND CALLED VALUE. Performed at [facility], [address] [place], [place] [address]    Culture GRAM NEGATIVE RODS  Final   Report Status PENDING  Incomplete  Blood Culture ID Panel (Reflexed)     Status: Abnormal (Preliminary result)   Collection Time: 09/21/23  3:50 PM  Result Value Ref Range Status   Enterococcus faecalis NOT DETECTED NOT DETECTED Final   Enterococcus Faecium NOT DETECTED NOT DETECTED Final   Listeria monocytogenes NOT DETECTED NOT DETECTED Final   Staphylococcus species NOT DETECTED NOT DETECTED Final   Staphylococcus aureus (BCID) NOT DETECTED NOT DETECTED Final   Staphylococcus epidermidis NOT DETECTED NOT DETECTED Final   Staphylococcus lugdunensis NOT DETECTED NOT DETECTED Final   Streptococcus species NOT DETECTED NOT DETECTED Final   Streptococcus agalactiae NOT DETECTED NOT DETECTED Final   Streptococcus pneumoniae NOT DETECTED NOT DETECTED Final   Streptococcus pyogenes NOT DETECTED NOT DETECTED Final   A.calcoaceticus-baumannii NOT DETECTED NOT DETECTED Final   Bacteroides fragilis NOT DETECTED NOT DETECTED Final   Enterobacterales PENDING  NOT DETECTED Incomplete   Enterobacter cloacae complex NOT DETECTED NOT DETECTED Final   Escherichia coli NOT DETECTED NOT DETECTED Final   Klebsiella aerogenes NOT DETECTED NOT DETECTED Final   Klebsiella oxytoca NOT DETECTED NOT DETECTED Final   Klebsiella pneumoniae DETECTED (A) NOT DETECTED Final    Comment: CRITICAL RESULT CALLED TO, READ BACK BY AND VERIFIED WITH: Tenishia Ekman, PHARMACY 07//27/2025 JL    Proteus species NOT DETECTED NOT DETECTED Final   Salmonella species NOT DETECTED NOT DETECTED Final   Serratia marcescens NOT DETECTED NOT DETECTED Final   Haemophilus influenzae NOT DETECTED NOT DETECTED Final   Neisseria meningitidis NOT DETECTED NOT DETECTED Final   Pseudomonas aeruginosa NOT DETECTED NOT DETECTED Final   Stenotrophomonas maltophilia NOT DETECTED NOT DETECTED Final   Candida albicans NOT DETECTED NOT DETECTED Final   Candida auris NOT DETECTED NOT DETECTED Final   Candida glabrata NOT DETECTED NOT DETECTED Final   Candida krusei NOT DETECTED NOT DETECTED Final   Candida parapsilosis NOT DETECTED NOT DETECTED Final   Candida tropicalis NOT DETECTED NOT DETECTED Final   Cryptococcus neoformans/gattii NOT DETECTED NOT DETECTED Final   CTX-M ESBL NOT DETECTED NOT DETECTED Final   Carbapenem resistance IMP NOT DETECTED NOT DETECTED Final   Carbapenem resistance KPC NOT DETECTED NOT DETECTED Final   Carbapenem resistance NDM NOT DETECTED NOT DETECTED Final   Carbapenem resist OXA 48 LIKE NOT DETECTED NOT DETECTED Final   Carbapenem resistance VIM NOT DETECTED  NOT DETECTED Final    Comment: Performed at [facility], [address] [place], [place] [address]    BCID Results: 4 of 4 bottles with Klebsiella pneumonia, no resistance detected.  Pt currently on Cefepime  and Vancomycin .  Name of provider contacted: WENDI Cone, NP   Changes to prescribed antibiotics required: Transition pt to Ceftriaxone  2 gm.  Mason Hill, PharmD,  [facility] 09/22/2023 3:26 AM

## 2023-09-22 NOTE — Progress Notes (Signed)
 Progress Note   Patient: Mason Hill FMW:[id number] DOB: 01-11-1941 DOA: 09/21/2023     1 DOS: the patient was seen and examined on 09/22/2023   Brief hospital course:  Anders Hohmann is a 83 y.o. male with medical history significant of mitral valve endocarditis,  HTN, HLD, DM, CAD, MI, s/p of stent, dCHF, CKD-3a, A-fib on Eliquis , pericardial effusion, and SVT, who presents with weakness, fever, mild cough. ... See H&P for full HPI on admission & ED course.  Patient was admitted for further evaluation and management of severe sepsis due to community-acquired pneumonia, and was started on empiric IV antibiotics and IV fluids.  Due to initial hypotension, pt was given Solu-Cortef , midodrine  and sepsis IV fluids with BP improved and since stable.  Pt initially required 4 L/min nasal cannula oxygen. Blood cultures were drawn on admission and are growing Klebsiella pneumoniae.  Further hospital course and management as outlined below.    Assessment and Plan:  Severe Sepsis - POA with fever, tachycardia, tachypnea, organ dysfunction of acute respiratory failure with hypoxia.  Source is CAP complicated by Klebsiella bacteremia.  Note history of mitral valve endocarditis. Lactic acid was normal 1.8. --Monitor fever curve, CBC, hemodynamics closely --Continue antibiotics and follow cultures --Telemetry for now  Klebsiella bacteremia --  Community-acquired Pneumonia --  Initially placed on broad spectrum coverage with IV Vancomycin  & Cefepime . Blood cultures growing Klebsiella pneumoniae. Strep pneumo antigen negative.  Legionella still pending. --Antibiotics changed to Rocephin  - continue --Consult Infectious Disease given hx of endocarditis --Repeat blood cultures --Follow initial cultures for sensitivities --Continue midodrine  for BP support and wean as BP tolerates --Completing sepsis IV fluids today, monitor BP once off fluids --PRN bronchodilators --Mucolytics, antitussives & other  supportive care per ordres --IS and flutter  Acute Respiratory Failure with Hypoxia -- spO2 reported 83-89% on room air with EMS, improved on 4 L/min nasal cannula O2.   --Mgmt on PNA as outlined --Weaned to room air today (7/27) --Supplement O2 to keep sats > 90%   CAD s/p stent. Stable, no chest pain. Myocardial injury / demand ischemia:  Trop peaked 193 >> 164.  --Continue Eliquis  and Plavix , Lipitor    Chronic diastolic CHF (congestive heart failure) (HCC): 2D echo on 12/16/2022 showed EF of 60-85%.  Patient has 1+ leg edema, BNP 182, no pulm edema on chest x-ray.  Does not seem to have CHF exacerbation.  Obesity patient is at high risk of developing CHF exacerbation. --Holding torsemide , spironolactone  due to sepsis and hypotension --Watch volume status closely --Resume held meds when able   Essential hypertension --Hold diuretics and blood pressure medications due to sepsis and hypotension --IV hydralazine PRN   HLD (hyperlipidemia) --Lipitor    Paroxysmal atrial fibrillation (HCC): tachycardic due to sepsis on admission. HR's controlled today --Continue Eliquis  --Not on rate control agent as outpatient per med list   Type II diabetes mellitus with renal manifestations [facility]): Recent A1c 6.0, well-controlled.  Home regimen: Jardiance  --Sliding scale Novolog    CKD stage 3a [facility]): Renal function stable.   --Monitor BMP   Overweight (BMI 25.0-29.9): Body weight 86.4 kg, BMI 25.82 - Encourage losing weight - Exercise healthy diet          Subjective: Pt reports feeling much better today.  Denies shortness of breath.  Says he'd been feeling well until yesterday, he suddenly got extremely weak. Denies very much cough. No fever/chills today.  No other acute complaints.  Pt reports left leg and foot are usually more swollen than the  right, chronically. Reports history of lymphedema   Physical Exam: Vitals:   09/22/23 0425 09/22/23 0500 09/22/23 0754 09/22/23 1137  BP:  114/69  112/66 107/71  Pulse: (!) 56  60 (!) 56  Resp:   16 16  Temp: 98.6 F (37 C)  (!) 97.5 F (36.4 C) 97.9 F (36.6 C)  TempSrc: Oral  Oral Oral  SpO2: 95%  95% 99%  Weight:  86.5 kg    Height:       General exam: awake, alert, no acute distress HEENT: moist mucus membranes, hearing grossly normal  Respiratory system: faint rhonchi and diminished at bilateral bases, no wheezes, normal respiratory effort, on room air. Cardiovascular system: normal S1/S2, RRR Gastrointestinal system: soft, NT, ND, no HSM felt, +bowel sounds. Central nervous system: A&O x3. no gross focal neurologic deficits, normal speech Extremities: LLE & L pedal edema more than right (chronic) Skin: dry, intact, normal temperature Psychiatry: normal mood, congruent affect, judgement and insight appear normal   Data Reviewed:  Notable labs -- Glucose 207 Ca 7.7 CBG's at goal WBC 14.4 >> 29.1 Hbg stable 11.2  Micro -- Blood cultures from admission 7/26 -- +Klebsiella pneumoniae & enterobacterales --Sensitivities pending   Family Communication: None present. Will attempt to call daughter as time allows this afternoon  Disposition: Status is: Inpatient Remains inpatient appropriate because: severity of illness requiring further IV antibiotics   Planned Discharge Destination: Home    Time spent: 45 minutes  Author: Burnard DELENA Cunning, DO 09/22/2023 12:48 PM  For on call review [email or web address].

## 2023-09-22 NOTE — Consult Note (Signed)
 NAME: Mason Hill  DOB: 1941-02-13  MRN: [id number]  Date/Time: 09/22/2023 4:10 PM  REQUESTING PROVIDER: klebsiella bacteremia Subjective:  REASON FOR CONSULT: Dr.Griffith ?History from patient and chart reviewed  Mason Hill is a 83 y.o. with a history of  PAF HTN, DM, CKD, HLD, CHF, MI CAD, Group B streptococcal native valve endocardiits, lymphedema legs Left> rt, serratia bacteremia in the past, cellulitis in the past Presents with weakness  Pt is active and has been working in the [employer] since Friday HE has been tired and weak the past 2 days. Yesterday he gone out to get bricks and was feeling weak when sitting in the car and called 911 and Fire and EMT arrived- they were concerned about heat exhaustion . EMS vitals were Temp 103.2, Pulse 60, BP 100/43, sats 96% They applied icepacks and brought him to [facility] ED Vitals in the ED  09/21/23 15:44  BP 101/58 !  Temp 100.2 F (37.9 C)  Pulse Rate 113 !  Resp 30 !  SpO2 95 %  O2 Flow Rate (L/min) 4 L/min    Latest Reference Range & Units 09/21/23 15:50  WBC 4.0 - 10.5 K/uL 14.4 (H)  Hemoglobin 13.0 - 17.0 g/dL 88.9 (L)  HCT 60.9 - 47.9 % 33.5 (L)  Platelets 150 - 400 K/uL 242  Creatinine 0.61 - 1.24 mg/dL 8.83   Blood culture sent and started on vanco and cefepime  He got 100mg  of hydrocortisone  IV last night CXR bibasilar atelectasis CTA done no PE He had minimal cough- Resp panel of covid, flu and RSV negative  I am seeing the patient as blood culture is klebsiella pneumoniae  Pt has lymphedema legs -left > rt and has had cellulitis before Last year he had group B streptococcus and as a source was not identified he had TEE and it showed e several very small linear echodensities that are freely mobile on atrial surface of mitral valve, consistent with endocarditis. HE was treated with 6 weeks of IV penicillin  by ID physician at [facility]  In 2023 he had serratia bacteremia thought to be from leg ulcers and was treated  by [facility]  Pt does not have any pain left leg- says the leg has been swollen and he takes frusemide  H/o CAD had stent in Jan 2025 and on Clopidogrel   Eliquis  for Afib  No cardiac devices No joint replacements  Past Medical History:  Diagnosis Date   A-fib [facility])    AMI (acute myocardial infarction) (HCC)    Diabetes mellitus without complication (HCC)    Hypertension     Past Surgical History:  Procedure Laterality Date   HERNIA REPAIR     TEE WITHOUT CARDIOVERSION N/A 10/30/2022   Procedure: TRANSESOPHAGEAL ECHOCARDIOGRAM;  Surgeon: Lonni Slain, MD;  Location: [facility] INVASIVE CV LAB;  Service: Cardiovascular;  Laterality: N/A;    Social History   Socioeconomic History   Marital status: Divorced    Spouse name: Not on file   Number of children: Not on file   Years of education: Not on file   Highest education level: Not on file  Occupational History   Not on file  Tobacco Use   Smoking status: Never   Smokeless tobacco: Never  Vaping Use   Vaping status: Never Used  Substance and Sexual Activity   Alcohol use: Never   Drug use: Never   Sexual activity: Never  Other Topics Concern   Not on file  Social History Narrative  Not on file   Social Drivers of Health   Financial Resource Strain: Low Risk  (06/06/2023)   Received from [facility]   Overall Financial Resource Strain (CARDIA)    Difficulty of Paying Living Expenses: Not hard at all  Food Insecurity: No Food Insecurity (09/21/2023)   Hunger Vital Sign    Worried About Running Out of Food in the Last Year: Never true    Ran Out of Food in the Last Year: Never true  Transportation Needs: No Transportation Needs (09/21/2023)   PRAPARE - Administrator, Civil Service (Medical): No    Lack of Transportation (Non-Medical): No  Physical Activity: Insufficiently Active (07/24/2019)   Received from [facility]   Exercise Vital Sign    On average, how many  days per week do you engage in moderate to strenuous exercise (like a brisk walk)?: 2 days    On average, how many minutes do you engage in exercise at this level?: 60 min  Stress: No Stress Concern Present (07/24/2019)   Received from [facility] of Occupational Health - Occupational Stress Questionnaire    Feeling of Stress : Not at all  Social Connections: Socially Integrated (09/21/2023)   Social Connection and Isolation Panel    Frequency of Communication with Friends and Family: More than three times a week    Frequency of Social Gatherings with Friends and Family: More than three times a week    Attends Religious Services: More than 4 times per year    Active Member of [employer] or Organizations: Yes    Attends Banker Meetings: More than 4 times per year    Marital Status: Married  Catering manager Violence: Not At Risk (09/21/2023)   Humiliation, Afraid, Rape, and Kick questionnaire    Fear of Current or Ex-Partner: No    Emotionally Abused: No    Physically Abused: No    Sexually Abused: No    History reviewed. No pertinent family history. Allergies  Allergen Reactions   Codeine Other (See Comments) and Rash    Other Reaction: BLISTERING, PEELING.   Erythromycin    Isosorbide Nitrate     Other reaction(s): Headache   I? Current Facility-Administered Medications  Medication Dose Route Frequency Provider Last Rate Last Admin   0.9 %  sodium chloride  infusion   Intravenous Continuous Niu, Xilin, MD 75 mL/hr at 09/22/23 1310 New Bag at 09/22/23 1310   albuterol  (PROVENTIL ) (2.5 MG/3ML) 0.083% nebulizer solution 2.5 mg  2.5 mg Inhalation Q4H PRN Niu, Xilin, MD       apixaban  (ELIQUIS ) tablet 5 mg  5 mg Oral BID Niu, Xilin, MD   5 mg at 09/22/23 9090   atorvastatin  (LIPITOR ) tablet 80 mg  80 mg Oral Daily Niu, Xilin, MD   80 mg at 09/22/23 9090   cefTRIAXone  (ROCEPHIN ) 2 g in sodium chloride  0.9 % 100 mL IVPB  2 g Intravenous Q24H  Jesus America, NP 200 mL/hr at 09/22/23 0911 2 g at 09/22/23 0911   clopidogrel  (PLAVIX ) tablet 75 mg  75 mg Oral Daily Niu, Xilin, MD   75 mg at 09/22/23 9089   dextromethorphan-guaiFENesin  (MUCINEX  DM) 30-600 MG per 12 hr tablet 1 tablet  1 tablet Oral BID PRN Niu, Xilin, MD       insulin  aspart (novoLOG ) injection 0-5 Units  0-5 Units Subcutaneous QHS Niu, Xilin, MD       insulin  aspart (novoLOG )  injection 0-9 Units  0-9 Units Subcutaneous TID WC Niu, Xilin, MD   1 Units at 09/22/23 1212   midodrine  (PROAMATINE ) tablet 10 mg  10 mg Oral TID WC Niu, Xilin, MD   10 mg at 09/22/23 1211   oxyCODONE  (Oxy IR/ROXICODONE ) immediate release tablet 5 mg  5 mg Oral Q6H PRN Niu, Xilin, MD         Abtx:  Anti-infectives (From admission, onward)    Start     Dose/Rate Route Frequency Ordered Stop   09/22/23 2100  vancomycin  (VANCOREADY) IVPB 1500 mg/300 mL  Status:  Discontinued        1,500 mg 150 mL/hr over 120 Minutes Intravenous Every 24 hours 09/21/23 2000 09/22/23 0423   09/22/23 1600  cefTRIAXone  (ROCEPHIN ) 2 g in sodium chloride  0.9 % 100 mL IVPB  Status:  Discontinued        2 g 200 mL/hr over 30 Minutes Intravenous Every 24 hours 09/21/23 1940 09/21/23 1942   09/22/23 1000  cefTRIAXone  (ROCEPHIN ) 2 g in sodium chloride  0.9 % 100 mL IVPB        2 g 200 mL/hr over 30 Minutes Intravenous Every 24 hours 09/22/23 0422     09/21/23 2100  doxycycline  (VIBRAMYCIN ) 100 mg in sodium chloride  0.9 % 250 mL IVPB  Status:  Discontinued        100 mg 125 mL/hr over 120 Minutes Intravenous Every 12 hours 09/21/23 1940 09/21/23 1942   09/21/23 2100  ceFEPIme  (MAXIPIME ) 2 g in sodium chloride  0.9 % 100 mL IVPB  Status:  Discontinued        2 g 200 mL/hr over 30 Minutes Intravenous Every 12 hours 09/21/23 1946 09/22/23 0423   09/21/23 2100  vancomycin  (VANCOREADY) IVPB 1750 mg/350 mL        1,750 mg 175 mL/hr over 120 Minutes Intravenous  Once 09/21/23 1949 09/22/23 0047   09/21/23 1600  cefTRIAXone   (ROCEPHIN ) 2 g in sodium chloride  0.9 % 100 mL IVPB        2 g 200 mL/hr over 30 Minutes Intravenous Once 09/21/23 1551 09/21/23 1758       REVIEW OF SYSTEMS:  Const:  fever, negative chills, negative weight loss Eyes: negative diplopia or visual changes, negative eye pain ENT: negative coryza, negative sore throat Resp no significant  cough, hemoptysis, dyspnea Cards: negative for chest pain, palpitations, lower extremity edema GU: negative for frequency, dysuria and hematuria GI: Negative for abdominal pain, diarrhea, bleeding, constipation Skin: negative for rash and pruritus Heme: negative for easy bruising and gum/nose bleeding FD:ozqu leg swelling Neurolo:negative for headaches, dizziness, vertigo, memory problems  Psych: negative for feelings of anxiety, depression  Endocrine: negative for thyroid, diabetes Allergy/Immunology-as above Objective:  VITALS:  BP 107/71 (BP Location: Left Arm)   Pulse (!) 56   Temp 97.9 F (36.6 C) (Oral)   Resp 16   Ht 6' (1.829 m)   Wt 86.5 kg   SpO2 99%   BMI 25.86 kg/m   PHYSICAL EXAM:  General: Alert, cooperative, no distress, appears stated age.  Head: Normocephalic, without obvious abnormality, atraumatic. Eyes: Conjunctivae clear, anicteric sclerae. Pupils are equal ENT Nares normal. No drainage or sinus tenderness. Lips, mucosa, and tongue normal. No Thrush Neck: Supple, symmetrical, no adenopathy, thyroid: non tender no carotid bruit and no JVD. Back: No CVA tenderness. Lungs: Clear to auscultation bilaterally. No Wheezing or Rhonchi. No rales. Heart: irregular Abdomen: Soft, non-tender,not distended. Bowel sounds normal. No masses Extremities: left leg  more swollen than rt, warm below the left knee but not tender Skin dry and peeling- no open wounds        Skin:hyperpigmented spots  back Lymph: Cervical, supraclavicular normal. Neurologic: Grossly non-focal Pertinent Labs Lab Results CBC    Component Value  Date/Time   WBC 29.1 (H) 09/22/2023 0603   RBC 3.77 (L) 09/22/2023 0603   HGB 11.2 (L) 09/22/2023 0603   HCT 34.8 (L) 09/22/2023 0603   PLT 234 09/22/2023 0603   MCV 92.3 09/22/2023 0603   MCH 29.7 09/22/2023 0603   MCHC 32.2 09/22/2023 0603   RDW 14.9 09/22/2023 0603   LYMPHSABS 0.2 (L) 09/21/2023 1550   MONOABS 0.7 09/21/2023 1550   EOSABS 0.0 09/21/2023 1550   BASOSABS 0.1 09/21/2023 1550       Latest Ref Rng & Units 09/22/2023    6:03 AM 09/21/2023    3:50 PM 12/18/2022    7:59 AM  CMP  Glucose 70 - 99 mg/dL 792  884  879   BUN 8 - 23 mg/dL 20  17  37   Creatinine 0.61 - 1.24 mg/dL 8.94  8.83  9.16   Sodium 135 - 145 mmol/L 137  141  145   Potassium 3.5 - 5.1 mmol/L 4.4  4.0  3.6   Chloride 98 - 111 mmol/L 107  107  100   CO2 22 - 32 mmol/L 23  26  30    Calcium  8.9 - 10.3 mg/dL 7.7  8.0  9.2   Total Protein 6.5 - 8.1 g/dL  4.3    Total Bilirubin 0.0 - 1.2 mg/dL  1.0    Alkaline Phos 38 - 126 U/L  70    AST 15 - 41 U/L  24    ALT 0 - 44 U/L  12        Microbiology: Recent Results (from the past 240 hours)  Resp panel by RT-PCR (RSV, Flu A&B, Covid) Anterior Nasal Swab     Status: None   Collection Time: 09/21/23  3:50 PM   Specimen: Anterior Nasal Swab  Result Value Ref Range Status   SARS Coronavirus 2 by RT PCR NEGATIVE NEGATIVE Final    Comment: (NOTE) SARS-CoV-2 target nucleic acids are NOT DETECTED.  The SARS-CoV-2 RNA is generally detectable in upper respiratory specimens during the acute phase of infection. The lowest concentration of SARS-CoV-2 viral copies this assay can detect is 138 copies/mL. A negative result does not preclude SARS-Cov-2 infection and should not be used as the sole basis for treatment or other patient management decisions. A negative result may occur with  improper specimen collection/handling, submission of specimen other than nasopharyngeal swab, presence of viral mutation(s) within the areas targeted by this assay, and  inadequate number of viral copies(<138 copies/mL). A negative result must be combined with clinical observations, patient history, and epidemiological information. The expected result is Negative.  Fact Sheet for Patients:  BloggerCourse.com  Fact Sheet for Healthcare Providers:  SeriousBroker.it  This test is no t yet approved or cleared by the [place]  FDA and  has been authorized for detection and/or diagnosis of SARS-CoV-2 by FDA under an Emergency Use Authorization (EUA). This EUA will remain  in effect (meaning this test can be used) for the duration of the COVID-19 declaration under Section 564(b)(1) of the Act, 21 U.S.C.section 360bbb-3(b)(1), unless the authorization is terminated  or revoked sooner.       Influenza A by PCR NEGATIVE NEGATIVE Final   Influenza B  by PCR NEGATIVE NEGATIVE Final    Comment: (NOTE) The Xpert Xpress SARS-CoV-2/FLU/RSV plus assay is intended as an aid in the diagnosis of influenza from Nasopharyngeal swab specimens and should not be used as a sole basis for treatment. Nasal washings and aspirates are unacceptable for Xpert Xpress SARS-CoV-2/FLU/RSV testing.  Fact Sheet for Patients: BloggerCourse.com  Fact Sheet for Healthcare Providers: SeriousBroker.it  This test is not yet approved or cleared by the [place]  FDA and has been authorized for detection and/or diagnosis of SARS-CoV-2 by FDA under an Emergency Use Authorization (EUA). This EUA will remain in effect (meaning this test can be used) for the duration of the COVID-19 declaration under Section 564(b)(1) of the Act, 21 U.S.C. section 360bbb-3(b)(1), unless the authorization is terminated or revoked.     Resp Syncytial Virus by PCR NEGATIVE NEGATIVE Final    Comment: (NOTE) Fact Sheet for Patients: BloggerCourse.com  Fact Sheet for Healthcare  Providers: SeriousBroker.it  This test is not yet approved or cleared by the [place]  FDA and has been authorized for detection and/or diagnosis of SARS-CoV-2 by FDA under an Emergency Use Authorization (EUA). This EUA will remain in effect (meaning this test can be used) for the duration of the COVID-19 declaration under Section 564(b)(1) of the Act, 21 U.S.C. section 360bbb-3(b)(1), unless the authorization is terminated or revoked.  Performed at [facility], [address], [place], [place] [address]   Blood Culture (routine x 2)     Status: None (Preliminary result)   Collection Time: 09/21/23  3:50 PM   Specimen: BLOOD RIGHT HAND  Result Value Ref Range Status   Specimen Description   Final    BLOOD RIGHT HAND Performed at [facility], [address] [place], [place] [address]    Special Requests   Final    BOTTLES DRAWN AEROBIC AND ANAEROBIC Blood Culture adequate volume Performed at [facility], [address] [place], [place] [address]    Culture  Setup Time   Final    IN BOTH AEROBIC AND ANAEROBIC BOTTLES GRAM NEGATIVE RODS CRITICAL RESULT CALLED TO, READ BACK BY AND VERIFIED WITH: NATHAN BELUE, PHARMACY 07//27/2025 0326 JL    Culture GRAM NEGATIVE RODS  Final   Report Status PENDING  Incomplete  Blood Culture (routine x 2)     Status: None (Preliminary result)   Collection Time: 09/21/23  3:50 PM   Specimen: BLOOD  Result Value Ref Range Status   Specimen Description   Final    BLOOD LEFT ANTECUBITAL Performed at [facility], [address], [place], [place] [address]    Special Requests   Final    BOTTLES DRAWN AEROBIC AND ANAEROBIC Blood Culture adequate volume Performed at [facility], [address] [place], [place] [address]    Culture  Setup Time   Final    GRAM NEGATIVE RODS IN BOTH AEROBIC AND ANAEROBIC BOTTLES CRITICAL VALUE NOTED.  VALUE IS CONSISTENT WITH  PREVIOUSLY REPORTED AND CALLED VALUE.    Culture GRAM NEGATIVE RODS  Final   Report Status PENDING  Incomplete  Blood Culture ID Panel (Reflexed)     Status: Abnormal   Collection Time: 09/21/23  3:50 PM  Result Value Ref Range Status   Enterococcus faecalis NOT DETECTED NOT DETECTED Final   Enterococcus Faecium NOT DETECTED NOT DETECTED Final   Listeria monocytogenes NOT DETECTED NOT DETECTED Final   Staphylococcus species NOT DETECTED NOT DETECTED Final   Staphylococcus aureus (BCID) NOT DETECTED  NOT DETECTED Final   Staphylococcus epidermidis NOT DETECTED NOT DETECTED Final   Staphylococcus lugdunensis NOT DETECTED NOT DETECTED Final   Streptococcus species NOT DETECTED NOT DETECTED Final   Streptococcus agalactiae NOT DETECTED NOT DETECTED Final   Streptococcus pneumoniae NOT DETECTED NOT DETECTED Final   Streptococcus pyogenes NOT DETECTED NOT DETECTED Final   A.calcoaceticus-baumannii NOT DETECTED NOT DETECTED Final   Bacteroides fragilis NOT DETECTED NOT DETECTED Final   Enterobacterales DETECTED (A) NOT DETECTED Final    Comment: CRITICAL RESULT CALLED TO, READ BACK BY AND VERIFIED WITH: NATHAN BELUE, PHARMACY 07//27/2025 JL    Enterobacter cloacae complex NOT DETECTED NOT DETECTED Final   Escherichia coli NOT DETECTED NOT DETECTED Final   Klebsiella aerogenes NOT DETECTED NOT DETECTED Final   Klebsiella oxytoca NOT DETECTED NOT DETECTED Final   Klebsiella pneumoniae DETECTED (A) NOT DETECTED Final    Comment: CRITICAL RESULT CALLED TO, READ BACK BY AND VERIFIED WITH: NATHAN BELUE, PHARMACY 07//27/2025 JL    Proteus species NOT DETECTED NOT DETECTED Final   Salmonella species NOT DETECTED NOT DETECTED Final   Serratia marcescens NOT DETECTED NOT DETECTED Final   Haemophilus influenzae NOT DETECTED NOT DETECTED Final   Neisseria meningitidis NOT DETECTED NOT DETECTED Final   Pseudomonas aeruginosa NOT DETECTED NOT DETECTED Final   Stenotrophomonas maltophilia NOT DETECTED  NOT DETECTED Final   Candida albicans NOT DETECTED NOT DETECTED Final   Candida auris NOT DETECTED NOT DETECTED Final   Candida glabrata NOT DETECTED NOT DETECTED Final   Candida krusei NOT DETECTED NOT DETECTED Final   Candida parapsilosis NOT DETECTED NOT DETECTED Final   Candida tropicalis NOT DETECTED NOT DETECTED Final   Cryptococcus neoformans/gattii NOT DETECTED NOT DETECTED Final   CTX-M ESBL NOT DETECTED NOT DETECTED Final   Carbapenem resistance IMP NOT DETECTED NOT DETECTED Final   Carbapenem resistance KPC NOT DETECTED NOT DETECTED Final   Carbapenem resistance NDM NOT DETECTED NOT DETECTED Final   Carbapenem resist OXA 48 LIKE NOT DETECTED NOT DETECTED Final   Carbapenem resistance VIM NOT DETECTED NOT DETECTED Final    Comment: Performed at [facility], [address] [place], [place] [address]    Lines and Device Date on insertion # of days DC  Central line     Foley     ETT       IMAGING RESULTS: CXR0 bilbasilar atelectasis  I have personally reviewed the films ? Impression/Recommendation ? Klesiella pneumoniae bacteremia Source unclear but could be left leg  No obvious pneumonia Continue ceftraixone  Dc vanco  Leucocytosis - bump is likely due to steroid   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 Venous edema ? Early cellulitis left leg  Heat exhaustion   PAF on eliquis   CAD s/p stent on plavix ?  Increase in troponin  Treated GBS bacteremia with mitral valve endocarditis in Sept 2024  Treated Serratia bacteremia in 2023 This consult involved complex antimicrobial management  ________________________________________________ Discussed with patient, and his niece at bed side

## 2023-09-22 NOTE — Plan of Care (Signed)

## 2023-09-22 NOTE — Plan of Care (Signed)
  Problem: Education: Goal: Ability to describe self-care measures that may prevent or decrease complications (Diabetes Survival Skills Education) will improve Outcome: Progressing Goal: Individualized Educational Video(s) Outcome: Progressing   Problem: Coping: Goal: Ability to adjust to condition or change in health will improve Outcome: Progressing   Problem: Fluid Volume: Goal: Ability to maintain a balanced intake and output will improve Outcome: Progressing   Problem: Health Behavior/Discharge Planning: Goal: Ability to identify and utilize available resources and services will improve Outcome: Progressing Goal: Ability to manage health-related needs will improve Outcome: Progressing   Problem: Nutritional: Goal: Maintenance of adequate nutrition will improve Outcome: Progressing Goal: Progress toward achieving an optimal weight will improve Outcome: Progressing   Problem: Skin Integrity: Goal: Risk for impaired skin integrity will decrease Outcome: Progressing   Problem: Education: Goal: Knowledge of General Education information will improve Description: Including pain rating scale, medication(s)/side effects and non-pharmacologic comfort measures Outcome: Progressing   Problem: Clinical Measurements: Goal: Ability to maintain clinical measurements within normal limits will improve Outcome: Progressing Goal: Will remain free from infection Outcome: Progressing Goal: Diagnostic test results will improve Outcome: Progressing Goal: Respiratory complications will improve Outcome: Progressing Goal: Cardiovascular complication will be avoided Outcome: Progressing

## 2023-09-23 DIAGNOSIS — I890 Lymphedema, not elsewhere classified: Secondary | ICD-10-CM | POA: Diagnosis not present

## 2023-09-23 DIAGNOSIS — A419 Sepsis, unspecified organism: Secondary | ICD-10-CM | POA: Diagnosis not present

## 2023-09-23 DIAGNOSIS — R7881 Bacteremia: Secondary | ICD-10-CM | POA: Diagnosis not present

## 2023-09-23 DIAGNOSIS — I5032 Chronic diastolic (congestive) heart failure: Secondary | ICD-10-CM | POA: Diagnosis not present

## 2023-09-23 DIAGNOSIS — B961 Klebsiella pneumoniae [K. pneumoniae] as the cause of diseases classified elsewhere: Secondary | ICD-10-CM | POA: Diagnosis not present

## 2023-09-23 DIAGNOSIS — N1831 Chronic kidney disease, stage 3a: Secondary | ICD-10-CM | POA: Diagnosis not present

## 2023-09-23 DIAGNOSIS — D72829 Elevated white blood cell count, unspecified: Secondary | ICD-10-CM | POA: Diagnosis not present

## 2023-09-23 LAB — BASIC METABOLIC PANEL WITH GFR
Anion gap: 9 (ref 5–15)
BUN: 24 mg/dL — ABNORMAL HIGH (ref 8–23)
CO2: 24 mmol/L (ref 22–32)
Calcium: 7.8 mg/dL — ABNORMAL LOW (ref 8.9–10.3)
Chloride: 107 mmol/L (ref 98–111)
Creatinine, Ser: 1.02 mg/dL (ref 0.61–1.24)
GFR, Estimated: >60 mL/min (ref >60)
Glucose, Bld: 117 mg/dL — ABNORMAL HIGH (ref 70–99)
Potassium: 3.6 mmol/L (ref 3.5–5.1)
Sodium: 140 mmol/L (ref 135–145)

## 2023-09-23 LAB — CBC
HCT: 33.2 % — ABNORMAL LOW (ref 39.0–52.0)
Hemoglobin: 11 g/dL — ABNORMAL LOW (ref 13.0–17.0)
MCH: 30.4 pg (ref 26.0–34.0)
MCHC: 33.1 g/dL (ref 30.0–36.0)
MCV: 91.7 fL (ref 80.0–100.0)
Platelets: 224 K/uL (ref 150–400)
RBC: 3.62 MIL/uL — ABNORMAL LOW (ref 4.22–5.81)
RDW: 15.3 % (ref 11.5–15.5)
WBC: 19.3 K/uL — ABNORMAL HIGH (ref 4.0–10.5)
nRBC: 0 % (ref 0.0–0.2)

## 2023-09-23 LAB — GLUCOSE, CAPILLARY
Glucose-Capillary: 113 mg/dL — ABNORMAL HIGH (ref 70–99)
Glucose-Capillary: 113 mg/dL — ABNORMAL HIGH (ref 70–99)
Glucose-Capillary: 106 mg/dL — ABNORMAL HIGH (ref 70–99)
Glucose-Capillary: 118 mg/dL — ABNORMAL HIGH (ref 70–99)

## 2023-09-23 MED ORDER — EMPAGLIFLOZIN 25 MG PO TABS
25.0000 mg | ORAL_TABLET | Freq: Every day | ORAL | Status: DC
  Administered 2023-09-23 – 2023-09-26 (×4): 25 mg via ORAL
  Filled 2023-09-23 (×4): qty 1

## 2023-09-23 MED ORDER — TORSEMIDE 20 MG PO TABS
40.0000 mg | ORAL_TABLET | Freq: Every day | ORAL | Status: DC
  Administered 2023-09-23 – 2023-09-26 (×4): 40 mg via ORAL
  Filled 2023-09-23 (×4): qty 2

## 2023-09-23 MED ORDER — PROSOURCE PLUS PO LIQD
30.0000 mL | Freq: Two times a day (BID) | ORAL | Status: DC
  Administered 2023-09-23 – 2023-09-26 (×8): 30 mL via ORAL

## 2023-09-23 MED ORDER — ACETAMINOPHEN 500 MG PO TABS: 1000.0000 mg | ORAL_TABLET | Freq: Once | ORAL | Status: DC | PRN

## 2023-09-23 MED ORDER — POTASSIUM CHLORIDE CRYS ER 20 MEQ PO TBCR
20.0000 meq | EXTENDED_RELEASE_TABLET | Freq: Two times a day (BID) | ORAL | Status: DC
  Administered 2023-09-23 – 2023-09-26 (×7): 20 meq via ORAL
  Filled 2023-09-23 (×7): qty 1

## 2023-09-23 NOTE — Care Management Important Message (Signed)
 Important Message  Patient Details  Name: Mason Hill MRN: [id number] Date of Birth: [birth year]   Important Message Given:  Yes - Medicare IM     [name] 09/23/2023, 12:07 PM

## 2023-09-23 NOTE — Progress Notes (Signed)
 Date of Admission:  09/21/2023     ID: Mason Hill is a 83 y.o. male Principal Problem:   CAP (community acquired pneumonia) Active Problems:   Essential hypertension   Paroxysmal atrial fibrillation (HCC)   Sepsis (HCC)   HLD (hyperlipidemia)   CAD (coronary artery disease)   Myocardial injury   Chronic diastolic CHF (congestive heart failure) (HCC)   Chronic kidney disease, stage 3a (HCC)   Type II diabetes mellitus with renal manifestations (HCC)   Overweight (BMI 25.0-29.9)    Subjective: Pt doing fine No cough or sob Left leg swelling less  Medications:   (feeding supplement) PROSource Plus  30 mL Oral BID BM   apixaban   5 mg Oral BID   atorvastatin   80 mg Oral Daily   clopidogrel   75 mg Oral Daily   empagliflozin   25 mg Oral Daily   insulin  aspart  0-9 Units Subcutaneous TID WC   midodrine   10 mg Oral TID WC   potassium chloride  SA  20 mEq Oral BID   torsemide   40 mg Oral Daily    Objective: Vital signs in last 24 hours: Patient Vitals for the past 24 hrs:  BP Temp Pulse Resp SpO2 Weight  09/23/23 1624 (!) 112/55 98 F (36.7 C) (!) 55 16 97 % --  09/23/23 1206 (!) 114/59 97.8 F (36.6 C) (!) 57 18 97 % --  09/23/23 0810 106/61 97.9 F (36.6 C) (!) 58 17 98 % --  09/23/23 0500 -- -- -- -- -- 91.2 kg  09/23/23 0335 112/60 98.4 F (36.9 C) (!) 55 17 97 % --  09/22/23 2354 (!) 104/50 98.1 F (36.7 C) 60 17 96 % --  09/22/23 2020 112/63 98.1 F (36.7 C) (!) 56 17 98 % --     Lines and Device Date on insertion # of days DC  Engineer, technical sales     ETT       PHYSICAL EXAM:  General: Alert, cooperative, no distress, appears stated age.  Lungs: Clear to auscultation bilaterally. No Wheezing or Rhonchi. No rales. Heart: Regular rate and rhythm, no murmur, rub or gallop. Abdomen: Soft, non-tender,not distended. Bowel sounds normal. No masses Extremities: edema legs left > rt- dry scaly skin Skin: No rashes or lesions. Or  bruising Lymph: Cervical, supraclavicular normal. Neurologic: Grossly non-focal  Lab Results    Latest Ref Rng & Units 09/23/2023    9:32 AM 09/22/2023    6:03 AM 09/21/2023    3:50 PM  CBC  WBC 4.0 - 10.5 K/uL 19.3  29.1  14.4   Hemoglobin 13.0 - 17.0 g/dL 88.9  88.7  88.9   Hematocrit 39.0 - 52.0 % 33.2  34.8  33.5   Platelets 150 - 400 K/uL 224  234  242        Latest Ref Rng & Units 09/23/2023    9:32 AM 09/22/2023    6:03 AM 09/21/2023    3:50 PM  CMP  Glucose 70 - 99 mg/dL 882  792  884   BUN 8 - 23 mg/dL 24  20  17    Creatinine 0.61 - 1.24 mg/dL 8.97  8.94  8.83   Sodium 135 - 145 mmol/L 140  137  141   Potassium 3.5 - 5.1 mmol/L 3.6  4.4  4.0   Chloride 98 - 111 mmol/L 107  107  107   CO2 22 - 32 mmol/L 24  23  26  Calcium  8.9 - 10.3 mg/dL 7.8  7.7  8.0   Total Protein 6.5 - 8.1 g/dL   4.3   Total Bilirubin 0.0 - 1.2 mg/dL   1.0   Alkaline Phos 38 - 126 U/L   70   AST 15 - 41 U/L   24   ALT 0 - 44 U/L   12       Microbiology: 09/21/23 BC- klebsiella Studies/Results: CT Angio Chest PE W and/or Wo Contrast Result Date: 09/21/2023 CLINICAL DATA:  Pulmonary embolism (PE) suspected, high prob. Weakness. EXAM: CT ANGIOGRAPHY CHEST WITH CONTRAST TECHNIQUE: Multidetector CT imaging of the chest was performed using the standard protocol during bolus administration of intravenous contrast. Multiplanar CT image reconstructions and MIPs were obtained to evaluate the vascular anatomy. RADIATION DOSE REDUCTION: This exam was performed according to the departmental dose-optimization program which includes automated exposure control, adjustment of the mA and/or kV according to patient size and/or use of iterative reconstruction technique. CONTRAST:  75mL OMNIPAQUE  IOHEXOL  350 MG/ML SOLN COMPARISON:  10/25/2022 FINDINGS: Cardiovascular: Cardiomegaly. Small pericardial effusion. Three-vessel coronary artery disease. Aortic atherosclerosis. No evidence of aortic aneurysm. No filling defects  in the pulmonary arteries to suggest pulmonary emboli. Mediastinum/Nodes: No mediastinal, hilar, or axillary adenopathy. Trachea and esophagus are unremarkable. Thyroid unremarkable. Lungs/Pleura: Dependent atelectasis in the lung bases. Mild airway thickening in the lower lobes. No confluent opacities or effusions. Upper Abdomen: No acute findings Musculoskeletal: Chest wall soft tissues are unremarkable. No acute bony abnormality. Review of the MIP images confirms the above findings. IMPRESSION: Cardiomegaly, three-vessel coronary artery disease. Small pericardial effusion. No evidence of pulmonary embolus. Mild airway thickening in the lower lobes bilaterally. Bibasilar atelectasis. Aortic Atherosclerosis (ICD10-I70.0). Electronically Signed   By: Franky Crease M.D.   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 Venous edema ? Early cellulitis left leg   Heat exhaustion     PAF on eliquis    CAD s/p stent on plavix ?   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

## 2023-09-23 NOTE — Plan of Care (Signed)

## 2023-09-23 NOTE — Progress Notes (Signed)
 Progress Note   Patient: Mason Hill FMW:[id number] DOB: 07/15/1940 DOA: 09/21/2023     2 DOS: the patient was seen and examined on 09/23/2023   Brief hospital course:  Mason Hill is a 83 y.o. male with medical history significant of mitral valve endocarditis,  HTN, HLD, DM, CAD, MI, s/p of stent, dCHF, CKD-3a, A-fib on Eliquis , pericardial effusion, and SVT, who presents with weakness, fever, mild cough. ... See H&P for full HPI on admission & ED course.  Patient was admitted for further evaluation and management of severe sepsis due to community-acquired pneumonia, and was started on empiric IV antibiotics and IV fluids.  Due to initial hypotension, pt was given Solu-Cortef , midodrine  and sepsis IV fluids with BP improved and since stable.  Pt initially required 4 L/min nasal cannula oxygen. Blood cultures were drawn on admission and are growing Klebsiella pneumoniae.  Further hospital course and management as outlined below.    Assessment and Plan:  Severe Sepsis - POA with fever, tachycardia, tachypnea, organ dysfunction of acute respiratory failure with hypoxia.  Source is CAP complicated by Klebsiella bacteremia.  Note history of mitral valve endocarditis. Lactic acid was normal 1.8. --Stop IV fluids --Monitor fever curve, CBC, hemodynamics closely --Continue antibiotics and follow cultures --Telemetry for now  Klebsiella bacteremia --  Possible early cellulitis of lower extremity, due to lymphedema Possible Community-acquired Pneumonia --  Initially placed on broad spectrum coverage with IV Vancomycin  & Cefepime . Blood cultures growing Klebsiella pneumoniae. Strep pneumo antigen negative.  Legionella still pending. --Antibiotics changed to Rocephin  - continue --Consult Infectious Disease given hx of endocarditis --Repeat blood cultures --Follow initial cultures for sensitivities --Continue midodrine  for BP support and wean as BP tolerates --Completing sepsis IV fluids  today, monitor BP once off fluids --PRN bronchodilators --Mucolytics, antitussives & other supportive care per ordres --IS and flutter  Acute Respiratory Failure with Hypoxia -- spO2 reported 83-89% on room air with EMS, improved on 4 L/min nasal cannula O2.   --Mgmt on PNA as outlined --Weaned to room air today (7/27) --Supplement O2 to keep sats > 90%   CAD s/p stent. Stable, no chest pain. Myocardial injury / demand ischemia:  Trop peaked 193 >> 164.  --Continue Eliquis  and Plavix , Lipitor    Chronic diastolic CHF (congestive heart failure) (HCC): 2D echo on 12/16/2022 showed EF of 60-85%.  Patient has 1+ leg edema, BNP 182, no pulm edema on chest x-ray.  Does not seem to have CHF exacerbation.  Obesity patient is at high risk of developing CHF exacerbation. --Resume home torsemide  - held on admission for IV fluids in setting of sepsis --Holding spironolactone  - resume tomorrow if BP's will tolerate --Watch volume status closely --Resume held meds when able   Essential hypertension --Hold diuretics and blood pressure medications due to sepsis and hypotension --IV hydralazine PRN   HLD (hyperlipidemia) --Lipitor    Paroxysmal atrial fibrillation (HCC): tachycardic due to sepsis on admission. HR's controlled today --Continue Eliquis  --Not on rate control agent as outpatient per med list   Type II diabetes mellitus with renal manifestations [facility]): Recent A1c 6.0, well-controlled.  Home regimen: Jardiance  --Sliding scale Novolog    CKD stage 3a [facility]): Renal function stable.   --Monitor BMP   Overweight (BMI 25.0-29.9): Body weight 86.4 kg, BMI 25.82 - Encourage losing weight - Exercise healthy diet          Subjective: Pt reports feeling overall well today, maybe little bit weak.  Visiting with his pastor this AM.  He denies cough  or shortness of breath.  Leg swelling is little better today after having legs elevated since yesterday and overnight.  Leg swelling chronic from  lymphedema, worsens during day, improves with rest and elevation.  Pt denies fever/chills or other acute complaints.   Physical Exam: Vitals:   09/23/23 0335 09/23/23 0500 09/23/23 0810 09/23/23 1206  BP: 112/60  106/61 (!) 114/59  Pulse: (!) 55  (!) 58 (!) 57  Resp: 17  17 18   Temp: 98.4 F (36.9 C)  97.9 F (36.6 C) 97.8 F (36.6 C)  TempSrc:      SpO2: 97%  98% 97%  Weight:  91.2 kg    Height:       General exam: awake, alert, no acute distress HEENT: moist mucus membranes, hearing grossly normal  Respiratory system: CTAB, no wheezes or rhonchi, normal respiratory effort, on room air. Cardiovascular system: normal S1/S2, RRR Gastrointestinal system: soft, NT, ND Central nervous system: A&O x3. no gross focal neurologic deficits, normal speech Extremities: improved LLE & L pedal edema more than right (chronic) Skin: dry, intact, normal temperature Psychiatry: normal mood, congruent affect, judgement and insight appear normal   Data Reviewed:  Notable labs -- Glucose 117 BUN 24  CBG's at goal  WBC 14.4 >> 29.1 >> 19.3 Hbg stable 11.2 >> 11.0   Micro -- Blood cultures from admission 7/26 -- +Klebsiella pneumoniae & enterobacterales --Sensitivities pending   Family Communication: None present. Will attempt to call daughter as time allows this afternoon  Disposition: Status is: Inpatient Remains inpatient appropriate because: severity of illness requiring further IV antibiotics   Planned Discharge Destination: Home    Time spent: 42 minutes  Author: Burnard DELENA Cunning, DO 09/23/2023 1:00 PM  For on call review [email or web address].

## 2023-09-23 NOTE — Progress Notes (Signed)
 Transition of Care [facility]) - Inpatient Brief Assessment   Patient Details  Name: Mason Hill MRN: [id number] Date of Birth: 20-Jan-1941  Transition of Care [facility]) CM/SW Contact:    Benelli Winther C Farzad Tibbetts, RN Phone Number: 09/23/2023, 3:06 PM   Clinical Narrative: TOC continuing to follow patient's progress throughout discharge planning.   Transition of Care Asessment: Insurance and Status: Insurance coverage has been reviewed Patient has primary care physician: Yes   Prior level of function:: Independent Prior/Current Home Services: No current home services Social Drivers of Health Review: SDOH reviewed no interventions necessary Readmission risk has been reviewed: Yes Transition of care needs: no transition of care needs at this time

## 2023-09-24 ENCOUNTER — Inpatient Hospital Stay (HOSPITAL_COMMUNITY)
Admit: 2023-09-24 | Discharge: 2023-09-24 | Disposition: A | Discharge status: Home / Self Care | Attending: Infectious Diseases | Admitting: Infectious Diseases

## 2023-09-24 DIAGNOSIS — R7881 Bacteremia: Secondary | ICD-10-CM

## 2023-09-24 DIAGNOSIS — B961 Klebsiella pneumoniae [K. pneumoniae] as the cause of diseases classified elsewhere: Secondary | ICD-10-CM | POA: Diagnosis not present

## 2023-09-24 DIAGNOSIS — I890 Lymphedema, not elsewhere classified: Secondary | ICD-10-CM | POA: Diagnosis not present

## 2023-09-24 DIAGNOSIS — D72829 Elevated white blood cell count, unspecified: Secondary | ICD-10-CM | POA: Diagnosis not present

## 2023-09-24 LAB — CULTURE, BLOOD (ROUTINE X 2)
Special Requests: ADEQUATE
Special Requests: ADEQUATE

## 2023-09-24 LAB — GLUCOSE, CAPILLARY
Glucose-Capillary: 93 mg/dL (ref 70–99)
Glucose-Capillary: 115 mg/dL — ABNORMAL HIGH (ref 70–99)
Glucose-Capillary: 136 mg/dL — ABNORMAL HIGH (ref 70–99)
Glucose-Capillary: 132 mg/dL — ABNORMAL HIGH (ref 70–99)

## 2023-09-24 LAB — CBC
HCT: 32.4 % — ABNORMAL LOW (ref 39.0–52.0)
Hemoglobin: 10.9 g/dL — ABNORMAL LOW (ref 13.0–17.0)
MCH: 30.4 pg (ref 26.0–34.0)
MCHC: 33.6 g/dL (ref 30.0–36.0)
MCV: 90.3 fL (ref 80.0–100.0)
Platelets: 237 K/uL (ref 150–400)
RBC: 3.59 MIL/uL — ABNORMAL LOW (ref 4.22–5.81)
RDW: 15 % (ref 11.5–15.5)
WBC: 11.4 K/uL — ABNORMAL HIGH (ref 4.0–10.5)
nRBC: 0 % (ref 0.0–0.2)

## 2023-09-24 LAB — BASIC METABOLIC PANEL WITH GFR
Anion gap: 8 (ref 5–15)
BUN: 26 mg/dL — ABNORMAL HIGH (ref 8–23)
CO2: 25 mmol/L (ref 22–32)
Calcium: 7.9 mg/dL — ABNORMAL LOW (ref 8.9–10.3)
Chloride: 108 mmol/L (ref 98–111)
Creatinine, Ser: 1.02 mg/dL (ref 0.61–1.24)
GFR, Estimated: >60 mL/min (ref >60)
Glucose, Bld: 86 mg/dL (ref 70–99)
Potassium: 3.5 mmol/L (ref 3.5–5.1)
Sodium: 141 mmol/L (ref 135–145)

## 2023-09-24 MED ORDER — MIDODRINE HCL 5 MG PO TABS
5.0000 mg | ORAL_TABLET | Freq: Three times a day (TID) | ORAL | Status: DC
  Administered 2023-09-24 – 2023-09-25 (×2): 5 mg via ORAL
  Filled 2023-09-24 (×2): qty 1

## 2023-09-24 NOTE — Progress Notes (Addendum)
 Date of Admission:  09/21/2023     ID: Mason Hill is a 83 y.o. male Principal Problem:   CAP (community acquired pneumonia) Active Problems:   Essential hypertension   Paroxysmal atrial fibrillation (HCC)   Sepsis (HCC)   HLD (hyperlipidemia)   CAD (coronary artery disease)   Myocardial injury   Chronic diastolic CHF (congestive heart failure) (HCC)   Chronic kidney disease, stage 3a (HCC)   Type II diabetes mellitus with renal manifestations (HCC)   Overweight (BMI 25.0-29.9)   Bacteremia due to Klebsiella pneumoniae    Subjective: Pt doing fine No cough or sob Left leg swelling improving  Medications:   (feeding supplement) PROSource Plus  30 mL Oral BID BM   apixaban   5 mg Oral BID   atorvastatin   80 mg Oral Daily   clopidogrel   75 mg Oral Daily   empagliflozin   25 mg Oral Daily   insulin  aspart  0-9 Units Subcutaneous TID WC   midodrine   5 mg Oral TID WC   potassium chloride  SA  20 mEq Oral BID   torsemide   40 mg Oral Daily    Objective: Vital signs in last 24 hours: Patient Vitals for the past 24 hrs:  BP Temp Temp src Pulse Resp SpO2 Weight  09/24/23 1804 -- -- -- -- -- -- 81.5 kg  09/24/23 1547 (!) 116/56 98.1 F (36.7 C) -- (!) 58 20 95 % --  09/24/23 1147 111/69 97.9 F (36.6 C) -- (!) 56 20 97 % --  09/24/23 0731 (!) 104/51 98 F (36.7 C) -- (!) 54 20 100 % --  09/24/23 0425 (!) 113/54 98.2 F (36.8 C) -- (!) 57 18 98 % --  09/24/23 0009 (!) 116/58 97.6 F (36.4 C) Oral (!) 55 18 98 % --  09/23/23 2033 (!) 113/57 98 F (36.7 C) -- (!) 55 18 99 % --     Lines and Device Date on insertion # of days DC  Engineer, technical sales     ETT       PHYSICAL EXAM:  General: Alert, cooperative, no distress, appears stated age.  Lungs: Clear to auscultation bilaterally. No Wheezing or Rhonchi. No rales. Heart: s1s2. Abdomen: Soft, non-tender,not distended. Bowel sounds normal. No masses Extremities: edema legs left > rt- dry scaly  skin Warm left > rt- but improving Skin: No rashes or lesions. Or bruising Lymph: Cervical, supraclavicular normal. Neurologic: Grossly non-focal  Lab Results    Latest Ref Rng & Units 09/24/2023    6:30 AM 09/23/2023    9:32 AM 09/22/2023    6:03 AM  CBC  WBC 4.0 - 10.5 K/uL 11.4  19.3  29.1   Hemoglobin 13.0 - 17.0 g/dL 89.0  88.9  88.7   Hematocrit 39.0 - 52.0 % 32.4  33.2  34.8   Platelets 150 - 400 K/uL 237  224  234        Latest Ref Rng & Units 09/24/2023    6:30 AM 09/23/2023    9:32 AM 09/22/2023    6:03 AM  CMP  Glucose 70 - 99 mg/dL 86  882  792   BUN 8 - 23 mg/dL 26  24  20    Creatinine 0.61 - 1.24 mg/dL 8.97  8.97  8.94   Sodium 135 - 145 mmol/L 141  140  137   Potassium 3.5 - 5.1 mmol/L 3.5  3.6  4.4   Chloride 98 - 111 mmol/L 108  107  107   CO2 22 - 32 mmol/L 25  24  23    Calcium  8.9 - 10.3 mg/dL 7.9  7.8  7.7       Microbiology: 09/21/23 BC- klebsiella 4/4 09/23/23 Repeat BC- NG Studies/Results: CT chest  Mild airway thickeneing in lower lobes atelectasis    Assessment/Plan: Klesiella pneumoniae bacteremia Source unclear but could be left leg  No obvious pneumonia On ceftriaxone  Has PO option ( augmentin/keflex/levaquin /bactrim- would prefer quinolone if no Contraindication )  Will check post void bladder scan Repeat blood culture neg so far   Leucocytosis - bump is likely due to steroid  resolving   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 Venous edema ? Early cellulitis left leg   Heat exhaustion     PAF on eliquis    CAD s/p stent on plavix ?   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

## 2023-09-24 NOTE — Plan of Care (Signed)

## 2023-09-24 NOTE — Progress Notes (Addendum)
 Progress Note   Patient: Mason Hill FMW:[id number] DOB: September 11, 1940 DOA: 09/21/2023     3 DOS: the patient was seen and examined on 09/24/2023   Brief hospital course:  Cayde Held is a 83 y.o. male with medical history significant of mitral valve endocarditis,  HTN, HLD, DM, CAD, MI, s/p of stent, dCHF, CKD-3a, A-fib on Eliquis , pericardial effusion, and SVT, who presents with weakness, fever, mild cough. ... See H&P for full HPI on admission & ED course.  Patient was admitted for further evaluation and management of severe sepsis due to community-acquired pneumonia, and was started on empiric IV antibiotics and IV fluids.  Due to initial hypotension, pt was given Solu-Cortef , midodrine  and sepsis IV fluids with BP improved and since stable.  Pt initially required 4 L/min nasal cannula oxygen. Blood cultures were drawn on admission and are growing Klebsiella pneumoniae.  Further hospital course and management as outlined below.    Assessment and Plan:  Severe Sepsis - POA with fever, tachycardia, tachypnea, organ dysfunction of acute respiratory failure with hypoxia.  Source is CAP complicated by Klebsiella bacteremia.  Note history of mitral valve endocarditis. Lactic acid was normal 1.8. --Off IV fluids --Monitor fever curve, CBC, hemodynamics closely --Continue antibiotics and follow cultures --Telemetry for now  Klebsiella bacteremia --  Early cellulitis of lower extremity, due to lymphedema Less likely Community-acquired Pneumonia -- no clinical symptoms Initially placed on broad spectrum coverage with IV Vancomycin  & Cefepime . Blood cultures growing Klebsiella pneumoniae. Resistant to ampicillin. Strep pneumo antigen negative.  Legionella still pending. --Antibiotics changed to Rocephin  - continue --Consult Infectious Disease given hx of endocarditis --Repeat blood cultures - no growth so far - follow --Wean midodrine  10 >> 5 mg, further wean as BP tolerates --PRN  bronchodilators --Mucolytics, antitussives & supportive care per orders  Acute Respiratory Failure with Hypoxia -- spO2 reported 83-89% on room air with EMS, improved on 4 L/min nasal cannula O2.   RESOLVED. --Mgmt of PNA as outlined --Weaned to room air today (7/27) --Supplement O2 to keep sats > 90%   CAD s/p stent. Stable, no chest pain. Myocardial injury / demand ischemia:  Trop peaked 193 >> 164.  --Continue Eliquis  and Plavix , Lipitor    Chronic diastolic CHF (congestive heart failure) (HCC): 2D echo on 12/16/2022 showed EF of 60-85%.  Patient has 1+ leg edema, BNP 182, no pulm edema on chest x-ray.  Does not seem to have CHF exacerbation.  Obesity patient is at high risk of developing CHF exacerbation. --Resumed home torsemide  7/28 - held on admission for IV fluids in setting of sepsis --Holding spironolactone  - resume tomorrow if BP's will tolerate --Watch volume status closely   Essential hypertension --Diuretics and blood pressure medications held on admission due to sepsis and hypotension --Weaning midodrine  --Resumed on torsemide  --Still holding spironolactone  --IV hydralazine PRN   HLD (hyperlipidemia) --Lipitor    Paroxysmal atrial fibrillation (HCC): tachycardic due to sepsis on admission. HR's controlled today --Continue Eliquis  --Not on rate control agent as outpatient per med list   Type II diabetes mellitus with renal manifestations [facility]): Recent A1c 6.0, well-controlled.  Home regimen: Jardiance  --Sliding scale Novolog  --Resume Jardiance    CKD stage 3a [facility]): Renal function stable.   --Monitor BMP   Overweight (BMI 25.0-29.9): Body weight 86.4 kg, BMI 25.82 - Encourage losing weight - Exercise healthy diet          Subjective: Pt seated edge of bed, niece at bedside this AM.  Pt reports feeling well, has no complaints.  He and niece ask about better managing his lymphedema to prevent recurrent cellulitis/infections.   Discussed massage, compression,  good skin care, in severe cases lymphedema pumps but that patient's case seems well management with his diuretic and elevation.     Physical Exam: Vitals:   09/24/23 0009 09/24/23 0425 09/24/23 0731 09/24/23 1147  BP: (!) 116/58 (!) 113/54 (!) 104/51 111/69  Pulse: (!) 55 (!) 57 (!) 54 (!) 56  Resp: 18 18 20 20   Temp: 97.6 F (36.4 C) 98.2 F (36.8 C) 98 F (36.7 C) 97.9 F (36.6 C)  TempSrc: Oral     SpO2: 98% 98% 100% 97%  Weight:      Height:       General exam: awake, alert, no acute distress HEENT: moist mucus membranes, hearing grossly normal  Respiratory system: normal respiratory effort, on room air. Cardiovascular system: normal S1/S2, RRR Gastrointestinal system: soft, NT, ND Central nervous system: A&O x3. no gross focal neurologic deficits, normal speech Extremities: significantly improved BLE edema Skin: dry, intact, normal temperature Psychiatry: normal mood, congruent affect, judgement and insight appear normal   Data Reviewed:  Notable labs --   BUN 26 Ca 7.9  WBC 14.4 >> 29.1 >> 19.3 >> 11.4 Hbg stable 11.2 >> 11.0 >> 10.9   Micro -- Blood cultures from admission 7/26 -- +Klebsiella pneumoniae - resistant only to ampicillin   Family Communication: None present. Will attempt to call daughter as time allows this afternoon  Disposition: Status is: Inpatient Remains inpatient appropriate because: severity of illness requiring further IV antibiotics   Planned Discharge Destination: Home    Time spent: 38 minutes  Author: Burnard DELENA Cunning, DO 09/24/2023 1:39 PM  For on call review [email or web address].

## 2023-09-25 ENCOUNTER — Inpatient Hospital Stay

## 2023-09-25 DIAGNOSIS — A419 Sepsis, unspecified organism: Secondary | ICD-10-CM | POA: Diagnosis not present

## 2023-09-25 LAB — BASIC METABOLIC PANEL WITH GFR
Anion gap: 6 (ref 5–15)
BUN: 31 mg/dL — ABNORMAL HIGH (ref 8–23)
CO2: 29 mmol/L (ref 22–32)
Calcium: 8.2 mg/dL — ABNORMAL LOW (ref 8.9–10.3)
Chloride: 105 mmol/L (ref 98–111)
Creatinine, Ser: 0.87 mg/dL (ref 0.61–1.24)
GFR, Estimated: >60 mL/min (ref >60)
Glucose, Bld: 111 mg/dL — ABNORMAL HIGH (ref 70–99)
Potassium: 3.5 mmol/L (ref 3.5–5.1)
Sodium: 140 mmol/L (ref 135–145)

## 2023-09-25 LAB — GLUCOSE, CAPILLARY
Glucose-Capillary: 107 mg/dL — ABNORMAL HIGH (ref 70–99)
Glucose-Capillary: 108 mg/dL — ABNORMAL HIGH (ref 70–99)
Glucose-Capillary: 142 mg/dL — ABNORMAL HIGH (ref 70–99)
Glucose-Capillary: 127 mg/dL — ABNORMAL HIGH (ref 70–99)

## 2023-09-25 LAB — LEGIONELLA PNEUMOPHILA SEROGP 1 UR AG: L. pneumophila Serogp 1 Ur Ag: NEGATIVE

## 2023-09-25 LAB — CBC
HCT: 34.7 % — ABNORMAL LOW (ref 39.0–52.0)
Hemoglobin: 11.4 g/dL — ABNORMAL LOW (ref 13.0–17.0)
MCH: 29.8 pg (ref 26.0–34.0)
MCHC: 32.9 g/dL (ref 30.0–36.0)
MCV: 90.8 fL (ref 80.0–100.0)
Platelets: 259 K/uL (ref 150–400)
RBC: 3.82 MIL/uL — ABNORMAL LOW (ref 4.22–5.81)
RDW: 14.8 % (ref 11.5–15.5)
WBC: 8.4 K/uL (ref 4.0–10.5)
nRBC: 0 % (ref 0.0–0.2)

## 2023-09-25 MED ORDER — IOHEXOL 9 MG/ML PO SOLN
500.0000 mL | ORAL | Status: AC
  Administered 2023-09-25 (×2): 500 mL via ORAL

## 2023-09-25 MED ORDER — IOHEXOL 300 MG/ML  SOLN
100.0000 mL | Freq: Once | INTRAMUSCULAR | Status: AC | PRN
  Administered 2023-09-25: 100 mL via INTRAVENOUS

## 2023-09-25 NOTE — Plan of Care (Signed)

## 2023-09-25 NOTE — Progress Notes (Signed)
 PROGRESS NOTE    [place]  FMW:[id number] DOB: 20-Mar-1940 DOA: 09/21/2023 PCP: Davis Krystal Kotyk, MD   Brief Narrative:   83 y.o. male with medical history significant of mitral valve endocarditis,  HTN, HLD, DM, CAD, MI, s/p of stent, dCHF, CKD-3a, A-fib on Eliquis , pericardial effusion, and SVT, who presents with weakness, fever, mild cough. Patient was admitted for further evaluation and management of severe sepsis due to community-acquired pneumonia, and was started on empiric IV antibiotics and IV fluids. Blood cultures were drawn on admission and are growing Klebsiella pneumoniae. ID on board.  Assessment & Plan:  Principal Problem:   CAP (community acquired pneumonia) Active Problems:   Sepsis (HCC)   CAD (coronary artery disease)   Myocardial injury   Chronic diastolic CHF (congestive heart failure) (HCC)   Essential hypertension   HLD (hyperlipidemia)   Paroxysmal atrial fibrillation (HCC)   Type II diabetes mellitus with renal manifestations (HCC)   Chronic kidney disease, stage 3a (HCC)   Overweight (BMI 25.0-29.9)   Bacteremia due to Klebsiella pneumoniae    Severe Sepsis in the setting of klebsiella bacteremia - POA with fever, tachycardia, tachypnea, organ dysfunction of acute respiratory failure with hypoxia.  Source is CAP complicated by Klebsiella bacteremia.   He does have history of mitral valve endocarditis. Lactic acid was normal 1.8. Resolved now --Off IV fluids --Continue antibiotics and follow cultures    Klebsiella bacteremia --  Unclear source Early cellulitis of lower extremity, due to lymphedema Initially placed on broad spectrum coverage with IV Vancomycin  & Cefepime . Blood cultures growing Klebsiella pneumoniae. Resistant to ampicillin. Strep pneumo antigen negative.  Legionella still pending. --Antibiotics changed to Rocephin  - continue --ID on board --Repeat blood cultures 7/28 - no growth to date --PRN  bronchodilators --Mucolytics, antitussives & supportive care per orders -Ordered CT abdomen/pelvis today to see if source of infection can be identified. -Discussed with ID Dr Fayette   Acute Respiratory Failure with Hypoxia - resolved now. Not requiring any supplemental oxygen. --Weaned to room air (7/27)    CAD s/p stent. Stable, no chest pain.  Type II MI: in the setting of sepsis.  Trop peaked 193 >> 164.  --Continue Eliquis  and Plavix , Lipitor    Chronic diastolic CHF (congestive heart failure) :NYHA Class III.  2D echo on 12/16/2022 showed EF of 60-85%.  --Resumed home torsemide  7/28  --Holding spironolactone   --Fluid restriction of 1.5 L per day, daily weight and strict I/O Monitoring   Essential hypertension --Diuretics and blood pressure medications held on admission due to sepsis and hypotension --Dced midodrine  on 7/30. --Resumed torsemide  --Still holding spironolactone  --IV hydralazine PRN   Hyperlipidemia: --On Lipitor    Paroxysmal atrial fibrillation: rate controlled --Continue Eliquis  --Not on rate control agent as outpatient per med list   Type II diabetes mellitus: Recent A1c 6.0, well-controlled.  Home regimen: Jardiance  --Sliding scale Novolog  --Resumed Jardiance     Disposition: Home. Lives with grand sons.  DVT prophylaxis:  apixaban  (ELIQUIS ) tablet 5 mg     Code Status: Full Code Family Communication:   Status is: Inpatient Remains inpatient appropriate because: Klebsiella bacteremia    Subjective:  Denies chest pain or shortness of breath. Niece at the bedside. He was asking about going home.  Examination:  General exam: Appears calm and comfortable  Respiratory system: Clear to auscultation. Respiratory effort normal. Cardiovascular system: S1 & S2 heard, RRR. No JVD, murmurs, rubs, gallops or clicks. No pedal edema. Gastrointestinal system: Abdomen is nondistended, soft and nontender. No organomegaly  or masses felt. Normal bowel  sounds heard. Central nervous system: Alert and oriented. No focal neurological deficits. Extremities: Symmetric 5 x 5 power. Skin: No rashes, lesions or ulcers Psychiatry: Judgement and insight appear normal. Mood & affect appropriate.       Diet Orders (From admission, onward)     Start     Ordered   09/21/23 1915  Diet heart healthy/carb modified Fluid consistency: Thin  Diet effective now       Question:  Fluid consistency:  Answer:  Thin   09/21/23 1915            Objective: Vitals:   09/25/23 0038 09/25/23 0420 09/25/23 0500 09/25/23 0737  BP: 110/62 118/67  (!) 109/59  Pulse: (!) 56 (!) 57  (!) 57  Resp: 18 16  20   Temp: 98.4 F (36.9 C) 98.1 F (36.7 C)  98.3 F (36.8 C)  TempSrc: Oral     SpO2: 100% 98%  95%  Weight:   82 kg   Height:        Intake/Output Summary (Last 24 hours) at 09/25/2023 1115 Last data filed at 09/25/2023 1100 Gross per 24 hour  Intake 720 ml  Output 6400 ml  Net -5680 ml   Filed Weights   09/23/23 0500 09/24/23 1804 09/25/23 0500  Weight: 91.2 kg 81.5 kg 82 kg    Scheduled Meds:  (feeding supplement) PROSource Plus  30 mL Oral BID BM   apixaban   5 mg Oral BID   atorvastatin   80 mg Oral Daily   clopidogrel   75 mg Oral Daily   empagliflozin   25 mg Oral Daily   insulin  aspart  0-9 Units Subcutaneous TID WC   midodrine   5 mg Oral TID WC   potassium chloride  SA  20 mEq Oral BID   torsemide   40 mg Oral Daily   Continuous Infusions:  cefTRIAXone  (ROCEPHIN )  IV 2 g (09/25/23 0827)    Nutritional status     Body mass index is 24.52 kg/m.  Data Reviewed:   CBC: Recent Labs  Lab 09/21/23 1550 09/22/23 0603 09/23/23 0932 09/24/23 0630 09/25/23 0436  WBC 14.4* 29.1* 19.3* 11.4* 8.4  NEUTROABS 13.3*  --   --   --   --   HGB 11.0* 11.2* 11.0* 10.9* 11.4*  HCT 33.5* 34.8* 33.2* 32.4* 34.7*  MCV 92.0 92.3 91.7 90.3 90.8  PLT 242 234 224 237 259   Basic Metabolic Panel: Recent Labs  Lab 09/21/23 1550  09/22/23 0603 09/23/23 0932 09/24/23 0630 09/25/23 0436  NA 141 137 140 141 140  K 4.0 4.4 3.6 3.5 3.5  CL 107 107 107 108 105  CO2 26 23 24 25 29   GLUCOSE 115* 207* 117* 86 111*  BUN 17 20 24* 26* 31*  CREATININE 1.16 1.05 1.02 1.02 0.87  CALCIUM  8.0* 7.7* 7.8* 7.9* 8.2*  MG 1.7  --   --   --   --    GFR: Estimated Creatinine Clearance: 71.9 mL/min (by C-G formula based on SCr of 0.87 mg/dL). Liver Function Tests: Recent Labs  Lab 09/21/23 1550  AST 24  ALT 12  ALKPHOS 70  BILITOT 1.0  PROT 4.3*  ALBUMIN  1.8*   No results for input(s): LIPASE, AMYLASE in the last 168 hours. No results for input(s): AMMONIA in the last 168 hours. Coagulation Profile: Recent Labs  Lab 09/21/23 1550  INR 1.2   Cardiac Enzymes: Recent Labs  Lab 09/21/23 1550  CKTOTAL 47*  BNP (last 3 results) No results for input(s): PROBNP in the last 8760 hours. HbA1C: No results for input(s): HGBA1C in the last 72 hours. CBG: Recent Labs  Lab 09/24/23 0733 09/24/23 1144 09/24/23 1549 09/24/23 2153 09/25/23 0735  GLUCAP 93 115* 136* 132* 107*   Lipid Profile: No results for input(s): CHOL, HDL, LDLCALC, TRIG, CHOLHDL, LDLDIRECT in the last 72 hours. Thyroid Function Tests: No results for input(s): TSH, T4TOTAL, FREET4, T3FREE, THYROIDAB in the last 72 hours. Anemia Panel: No results for input(s): VITAMINB12, FOLATE, FERRITIN, TIBC, IRON, RETICCTPCT in the last 72 hours. Sepsis Labs: Recent Labs  Lab 09/21/23 1550  LATICACIDVEN 1.8    Recent Results (from the past 240 hours)  Resp panel by RT-PCR (RSV, Flu A&B, Covid) Anterior Nasal Swab     Status: None   Collection Time: 09/21/23  3:50 PM   Specimen: Anterior Nasal Swab  Result Value Ref Range Status   SARS Coronavirus 2 by RT PCR NEGATIVE NEGATIVE Final    Comment: (NOTE) SARS-CoV-2 target nucleic acids are NOT DETECTED.  The SARS-CoV-2 RNA is generally detectable in upper  respiratory specimens during the acute phase of infection. The lowest concentration of SARS-CoV-2 viral copies this assay can detect is 138 copies/mL. A negative result does not preclude SARS-Cov-2 infection and should not be used as the sole basis for treatment or other patient management decisions. A negative result may occur with  improper specimen collection/handling, submission of specimen other than nasopharyngeal swab, presence of viral mutation(s) within the areas targeted by this assay, and inadequate number of viral copies(<138 copies/mL). A negative result must be combined with clinical observations, patient history, and epidemiological information. The expected result is Negative.  Fact Sheet for Patients:  BloggerCourse.com  Fact Sheet for Healthcare Providers:  SeriousBroker.it  This test is no t yet approved or cleared by the [place]  FDA and  has been authorized for detection and/or diagnosis of SARS-CoV-2 by FDA under an Emergency Use Authorization (EUA). This EUA will remain  in effect (meaning this test can be used) for the duration of the COVID-19 declaration under Section 564(b)(1) of the Act, 21 U.S.C.section 360bbb-3(b)(1), unless the authorization is terminated  or revoked sooner.       Influenza A by PCR NEGATIVE NEGATIVE Final   Influenza B by PCR NEGATIVE NEGATIVE Final    Comment: (NOTE) The Xpert Xpress SARS-CoV-2/FLU/RSV plus assay is intended as an aid in the diagnosis of influenza from Nasopharyngeal swab specimens and should not be used as a sole basis for treatment. Nasal washings and aspirates are unacceptable for Xpert Xpress SARS-CoV-2/FLU/RSV testing.  Fact Sheet for Patients: BloggerCourse.com  Fact Sheet for Healthcare Providers: SeriousBroker.it  This test is not yet approved or cleared by the [place]  FDA and has been  authorized for detection and/or diagnosis of SARS-CoV-2 by FDA under an Emergency Use Authorization (EUA). This EUA will remain in effect (meaning this test can be used) for the duration of the COVID-19 declaration under Section 564(b)(1) of the Act, 21 U.S.C. section 360bbb-3(b)(1), unless the authorization is terminated or revoked.     Resp Syncytial Virus by PCR NEGATIVE NEGATIVE Final    Comment: (NOTE) Fact Sheet for Patients: BloggerCourse.com  Fact Sheet for Healthcare Providers: SeriousBroker.it  This test is not yet approved or cleared by the [place]  FDA and has been authorized for detection and/or diagnosis of SARS-CoV-2 by FDA under an Emergency Use Authorization (EUA). This EUA will remain in effect (meaning this test  can be used) for the duration of the COVID-19 declaration under Section 564(b)(1) of the Act, 21 U.S.C. section 360bbb-3(b)(1), unless the authorization is terminated or revoked.  Performed at [facility], [address] [place], [place] [address]   Blood Culture (routine x 2)     Status: Abnormal   Collection Time: 09/21/23  3:50 PM   Specimen: BLOOD RIGHT HAND  Result Value Ref Range Status   Specimen Description   Final    BLOOD RIGHT HAND Performed at [facility], [address], [place], [place] [address]    Special Requests   Final    BOTTLES DRAWN AEROBIC AND ANAEROBIC Blood Culture adequate volume Performed at [facility], [address] [place], [place] [address]    Culture  Setup Time   Final    IN BOTH AEROBIC AND ANAEROBIC BOTTLES GRAM NEGATIVE RODS CRITICAL RESULT CALLED TO, READ BACK BY AND VERIFIED WITH: NATHAN BELUE, PHARMACY 07//27/2025 0326 JL    Culture KLEBSIELLA PNEUMONIAE (A)  Final   Report Status 09/24/2023 FINAL  Final   Organism ID, Bacteria KLEBSIELLA PNEUMONIAE  Final   Organism ID, Bacteria KLEBSIELLA PNEUMONIAE  Final       Susceptibility   Klebsiella pneumoniae - KIRBY BAUER*    CEFAZOLIN SENSITIVE Sensitive    Klebsiella pneumoniae - MIC*    AMPICILLIN RESISTANT Resistant     CEFEPIME  <=0.12 SENSITIVE Sensitive     CEFTAZIDIME <=1 SENSITIVE Sensitive     CEFTRIAXONE  <=0.25 SENSITIVE Sensitive     CIPROFLOXACIN <=0.25 SENSITIVE Sensitive     GENTAMICIN <=1 SENSITIVE Sensitive     IMIPENEM <=0.25 SENSITIVE Sensitive     TRIMETH/SULFA <=20 SENSITIVE Sensitive     AMPICILLIN/SULBACTAM 4 SENSITIVE Sensitive     PIP/TAZO <=4 SENSITIVE Sensitive ug/mL    * KLEBSIELLA PNEUMONIAE    KLEBSIELLA PNEUMONIAE  Blood Culture (routine x 2)     Status: Abnormal   Collection Time: 09/21/23  3:50 PM   Specimen: BLOOD  Result Value Ref Range Status   Specimen Description   Final    BLOOD LEFT ANTECUBITAL Performed at [facility], [address], [place], [place] [address]    Special Requests   Final    BOTTLES DRAWN AEROBIC AND ANAEROBIC Blood Culture adequate volume Performed at [facility], [address] [place], [place] [address]    Culture  Setup Time   Final    GRAM NEGATIVE RODS IN BOTH AEROBIC AND ANAEROBIC BOTTLES CRITICAL VALUE NOTED.  VALUE IS CONSISTENT WITH PREVIOUSLY REPORTED AND CALLED VALUE.    Culture (A)  Final    KLEBSIELLA PNEUMONIAE SUSCEPTIBILITIES PERFORMED ON PREVIOUS CULTURE WITHIN THE LAST 5 DAYS. Performed at [facility], [address], [place], [place] [address]    Report Status 09/24/2023 FINAL  Final  Blood Culture ID Panel (Reflexed)     Status: Abnormal   Collection Time: 09/21/23  3:50 PM  Result Value Ref Range Status   Enterococcus faecalis NOT DETECTED NOT DETECTED Final   Enterococcus Faecium NOT DETECTED NOT DETECTED Final   Listeria monocytogenes NOT DETECTED NOT DETECTED Final   Staphylococcus species NOT DETECTED NOT DETECTED Final   Staphylococcus aureus (BCID) NOT DETECTED NOT DETECTED Final   Staphylococcus epidermidis NOT DETECTED  NOT DETECTED Final   Staphylococcus lugdunensis NOT DETECTED NOT DETECTED Final   Streptococcus species NOT DETECTED NOT DETECTED Final   Streptococcus agalactiae NOT DETECTED NOT DETECTED Final   Streptococcus pneumoniae NOT  DETECTED NOT DETECTED Final   Streptococcus pyogenes NOT DETECTED NOT DETECTED Final   A.calcoaceticus-baumannii NOT DETECTED NOT DETECTED Final   Bacteroides fragilis NOT DETECTED NOT DETECTED Final   Enterobacterales DETECTED (A) NOT DETECTED Final    Comment: CRITICAL RESULT CALLED TO, READ BACK BY AND VERIFIED WITH: NATHAN BELUE, PHARMACY 07//27/2025 JL    Enterobacter cloacae complex NOT DETECTED NOT DETECTED Final   Escherichia coli NOT DETECTED NOT DETECTED Final   Klebsiella aerogenes NOT DETECTED NOT DETECTED Final   Klebsiella oxytoca NOT DETECTED NOT DETECTED Final   Klebsiella pneumoniae DETECTED (A) NOT DETECTED Final    Comment: CRITICAL RESULT CALLED TO, READ BACK BY AND VERIFIED WITH: NATHAN BELUE, PHARMACY 07//27/2025 JL    Proteus species NOT DETECTED NOT DETECTED Final   Salmonella species NOT DETECTED NOT DETECTED Final   Serratia marcescens NOT DETECTED NOT DETECTED Final   Haemophilus influenzae NOT DETECTED NOT DETECTED Final   Neisseria meningitidis NOT DETECTED NOT DETECTED Final   Pseudomonas aeruginosa NOT DETECTED NOT DETECTED Final   Stenotrophomonas maltophilia NOT DETECTED NOT DETECTED Final   Candida albicans NOT DETECTED NOT DETECTED Final   Candida auris NOT DETECTED NOT DETECTED Final   Candida glabrata NOT DETECTED NOT DETECTED Final   Candida krusei NOT DETECTED NOT DETECTED Final   Candida parapsilosis NOT DETECTED NOT DETECTED Final   Candida tropicalis NOT DETECTED NOT DETECTED Final   Cryptococcus neoformans/gattii NOT DETECTED NOT DETECTED Final   CTX-M ESBL NOT DETECTED NOT DETECTED Final   Carbapenem resistance IMP NOT DETECTED NOT DETECTED Final   Carbapenem resistance KPC NOT DETECTED NOT DETECTED Final    Carbapenem resistance NDM NOT DETECTED NOT DETECTED Final   Carbapenem resist OXA 48 LIKE NOT DETECTED NOT DETECTED Final   Carbapenem resistance VIM NOT DETECTED NOT DETECTED Final    Comment: Performed at [facility], [address] [place], [place] [address]  Culture, blood (Routine X 2) w Reflex to ID Panel     Status: None (Preliminary result)   Collection Time: 09/23/23  7:24 PM   Specimen: BLOOD  Result Value Ref Range Status   Specimen Description BLOOD RIGHT ANTECUBITAL  Final   Special Requests   Final    BOTTLES DRAWN AEROBIC AND ANAEROBIC Blood Culture adequate volume   Culture   Final    NO GROWTH 2 DAYS Performed at [facility], [address] [place], [place] [address]    Report Status PENDING  Incomplete  Culture, blood (Routine X 2) w Reflex to ID Panel     Status: None (Preliminary result)   Collection Time: 09/23/23  7:24 PM   Specimen: BLOOD  Result Value Ref Range Status   Specimen Description BLOOD BLOOD RIGHT HAND  Final   Special Requests   Final    BOTTLES DRAWN AEROBIC AND ANAEROBIC Blood Culture adequate volume   Culture   Final    NO GROWTH 2 DAYS Performed at [facility], [address], [place], [place] [address]    Report Status PENDING  Incomplete         Radiology Studies: No results found.      LOS: 4 days   Time spent= 42 mins    Deliliah Room, MD [facility]  If 7PM-7AM, please contact night-coverage  09/25/2023, 11:15 AM

## 2023-09-26 DIAGNOSIS — J189 Pneumonia, unspecified organism: Secondary | ICD-10-CM | POA: Diagnosis not present

## 2023-09-26 LAB — GLUCOSE, CAPILLARY
Glucose-Capillary: 107 mg/dL — ABNORMAL HIGH (ref 70–99)
Glucose-Capillary: 148 mg/dL — ABNORMAL HIGH (ref 70–99)

## 2023-09-26 LAB — ECHOCARDIOGRAM COMPLETE
Height: 72 in
S' Lateral: 4.2 cm
Weight: 2873.6 [oz_av]

## 2023-09-26 MED ORDER — LEVOFLOXACIN 750 MG PO TABS: 750.0000 mg | ORAL_TABLET | Freq: Every day | ORAL | 0 refills | Status: AC

## 2023-09-26 NOTE — Progress Notes (Signed)
   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 Venous edema ? Early cellulitis left leg   Heat exhaustion     PAF on eliquis    CAD s/p stent on plavix ?   Increase in troponin   Treated GBS bacteremia with mitral valve endocarditis in Sept 2024  will get 2 d echo  Treated Serratia bacteremia in 2023  Discussed the management with hospitalist and cardiologist

## 2023-09-26 NOTE — Plan of Care (Signed)

## 2023-09-26 NOTE — Discharge Summary (Signed)
 Physician Discharge Summary   Patient: Mason Hill MRN: [id number] DOB: Nov 26, 1940  Admit date:     09/21/2023  Discharge date: 09/26/23  Discharge Physician: AIDA CHO   PCP: Mason Krystal Kotyk, MD   Recommendations at discharge:  {Tip this will not be part of the note when signed- Example include specific recommendations for outpatient follow-up, pending tests to follow-up on. (Optional):26781} Follow-up with PCP in 1 week  Discharge Diagnoses: Principal Problem:   CAP (community acquired pneumonia) Active Problems:   Sepsis (HCC)   CAD (coronary artery disease)   Myocardial injury   Chronic diastolic CHF (congestive heart failure) (HCC)   Essential hypertension   HLD (hyperlipidemia)   Paroxysmal atrial fibrillation (HCC)   Type II diabetes mellitus with renal manifestations (HCC)   Chronic kidney disease, stage 3a (HCC)   Overweight (BMI 25.0-29.9)   Bacteremia due to Klebsiella pneumoniae  Resolved Problems:   * No resolved hospital problems. [facility] Course:  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  He was admitted to the hospital for severe sepsis.  Blood culture was positive for Klebsiella pneumoniae.   Assessment and Plan:   Severe sepsis secondary to Klebsiella pneumoniae bacteremia: He was initially treated with IV vancomycin , IV cefepime  and ceftriaxone  and was de-escalated to IV ceftriaxone  only.  He was seen in consultation by ID specialist who recommended oral Levaquin  for 5 more days at discharge. There was no clear evidence of pneumonia on CT chest.  No UTI. He has chronic left leg lymphedema but there is no clear evidence of cellulitis.  Source of Klebsiella bacteremia is  unclear.   2D echo showed EF estimated at 50 to 55%, indeterminate LV diastolic parameters, moderately reduced RV, mildly enlarged RV, moderately dilated RA. The mitral valve is normal in structure. Hx of endocarditis of the posterior MV leaflet. Unable to exclude mobile opacity on the posterior  leaflet concerning for vegetation. Images appear improved compared to prior study 12/16/22. Mild mitral valve  Regurgitation    Echo findings discussed with Dr. Fayette, ID specialist.  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   Acute hypoxic respiratory failure: Resolved.  He is tolerating room air   CAD s/p coronary stent: Continue Plavix  and statin   Chronic diastolic CHF: Compensated.  Continue torsemide  and spironolactone    Paroxysmal atrial fibrillation: Continue Eliquis    Comorbidities include type II DM, history of strep agalactiae bacteremia and endocarditis in September 2024   His condition has improved and he is deemed stable for discharge home today.  Discharge plan was discussed with patient and Mason Hill (niece) at the bedside.   {Tip this will not be part of the note when signed Body mass index is 25.21 kg/m. , ,  (Optional):26781}   Consultants: ID specialist Procedures performed: None Disposition: Home Diet recommendation:  Discharge Diet Orders (From admission, onward)     Start     Ordered   09/26/23 0000  Diet - low sodium heart healthy        09/26/23 1417           Cardiac and Carb modified  diet DISCHARGE MEDICATION: Allergies as of 09/26/2023       Reactions   Codeine Other (See Comments), Rash   Other Reaction: BLISTERING, PEELING.   Erythromycin    Isosorbide Nitrate    Other reaction(s): Headache        Medication List     STOP taking these medications    ezetimibe 10 MG  tablet Commonly known as: ZETIA       TAKE these medications    (feeding supplement) PROSource Plus liquid Take 30 mLs by mouth 2 (two) times daily between meals.   acetaminophen  500 MG tablet Commonly known as: TYLENOL  Take 1,000 mg by mouth once as needed for moderate pain (pain score 4-6).   atorvastatin  80 MG tablet Commonly known as: LIPITOR  Take 80 mg by mouth at bedtime.   clopidogrel  75 MG tablet Commonly known as: PLAVIX  Take 75 mg by mouth daily.   Eliquis  5 MG Tabs tablet Generic drug: apixaban  Take 1 tablet (5 mg total) by mouth 2 (two) times daily.   empagliflozin  25 MG Tabs tablet Commonly known as: JARDIANCE  Take 25 mg by mouth daily.   levofloxacin  750 MG tablet Commonly known as: Levaquin  Take 1 tablet (750 mg total) by mouth daily for 5 days. Start taking on: September 27, 2023   potassium chloride  SA 20 MEQ tablet Commonly known as: KLOR-CON  M Take 20 mEq by mouth 2 (two) times daily.   spironolactone  50 MG tablet Commonly known as: ALDACTONE  Take 50 mg by mouth daily.   torsemide  20 MG tablet Commonly known as: DEMADEX  Take 40 mg by mouth 2 (two) times daily.        Discharge Exam: Filed Weights   09/24/23 1804 09/25/23 0500 09/26/23 0500  Weight: 81.5 kg 82 kg 84.3 kg   GEN: NAD SKIN: Warm and dry EYES: No pallor or icterus ENT: MMM CV: RRR PULM: CTA B ABD: soft, ND, NT, +BS CNS: AAO x 3, non focal EXT: No edema or tenderness   Condition at discharge: good  The results of significant diagnostics from this hospitalization (including imaging, microbiology, ancillary and laboratory) are listed below for reference.   Imaging Studies: ECHOCARDIOGRAM COMPLETE Result Date: 09/26/2023    ECHOCARDIOGRAM REPORT   Patient Name:   Mason Hill Date of Exam: 09/24/2023 Medical Rec #:  [id number]    Height:       72.0 in Accession #:    [id number]   Weight:       179.6 lb Date of Birth:  1941/01/01    BSA:          2.035 m Patient Age:    82  years     BP:           105/57 mmHg Patient Gender: M            HR:           52 bpm. Exam Location:  [facility] Procedure: 2D Echo, Cardiac Doppler and Color Doppler (Both Spectral and Color            Flow Doppler were utilized during procedure). Indications:     R78.81 Bacteremia  History:         Patient has prior history of Echocardiogram examinations, most                  recent 12/16/2022. Previous Myocardial Infarction and CAD,                  Arrythmias:Atrial  Fibrillation; Risk Factors:Hypertension and                  Diabetes.  Sonographer:     Carl Coma RDCS Referring Phys:  [id number] DONALD BERLIN Diagnosing Phys: Evalene Lunger MD IMPRESSIONS  1. Left ventricular ejection fraction, by estimation, is 50 to 55%. The left ventricle has low normal function. The left ventricle has no regional wall motion abnormalities. Left ventricular diastolic parameters are indeterminate.  2. Right ventricular systolic function is moderately reduced. The right ventricular size is mildly enlarged. There is normal pulmonary artery systolic pressure. The estimated right ventricular systolic pressure is 32.9 mmHg.  3. Right atrial size was moderately dilated.  4. The mitral valve is normal in structure. Hx of endocarditis of the posterior MV leaflet. Unable to exclude mobile opacity on the posterior leaflet concerning for vegetation. Images appear improved compared to prior study 12/16/22. Mild mitral valve regurgitation. No evidence of mitral stenosis.  5. Tricuspid valve regurgitation is moderate.  6. The aortic valve is tricuspid. Aortic valve regurgitation is not visualized. No aortic stenosis is present.  7. The inferior vena cava is dilated in size with >50% respiratory variability, suggesting right atrial pressure of 8 mmHg. FINDINGS  Left Ventricle: Left ventricular ejection fraction, by estimation, is 50 to 55%. The left ventricle has low normal function. The left ventricle has no regional wall  motion abnormalities. Strain was performed and the global longitudinal strain is indeterminate. The left ventricular internal cavity size was normal in size. There is no left ventricular hypertrophy. Left ventricular diastolic parameters are indeterminate. Right Ventricle: The right ventricular size is mildly enlarged. No increase in right ventricular wall thickness. Right ventricular systolic function is moderately reduced. There is normal pulmonary artery systolic pressure. The tricuspid regurgitant velocity is 2.64 m/s, and with an assumed right atrial pressure of 5 mmHg, the estimated right ventricular systolic pressure is 32.9 mmHg. Left Atrium: Left atrial size was normal in size. Right Atrium: Right atrial size was moderately dilated. Pericardium: There is no evidence of pericardial effusion. Mitral Valve: The mitral valve is normal in structure. Mild mitral valve regurgitation. No evidence of mitral valve stenosis. Tricuspid Valve: The tricuspid valve is normal in structure. Tricuspid valve regurgitation is moderate . No evidence of tricuspid stenosis. Aortic Valve: The aortic valve is tricuspid. Aortic valve regurgitation is not visualized. No aortic stenosis is present. Pulmonic Valve: The pulmonic valve was normal in structure. Pulmonic valve regurgitation is not visualized. No evidence of pulmonic stenosis. Aorta: The aortic root is normal in size and structure. Venous: The inferior vena cava is dilated in size with greater than 50% respiratory variability, suggesting right atrial pressure of 8 mmHg. IAS/Shunts: No atrial level shunt detected by color flow Doppler. Additional Comments: 3D was performed not requiring image post processing on an independent workstation and was indeterminate.  LEFT VENTRICLE PLAX 2D LVIDd:         4.90 cm   Diastology LVIDs:         4.20 cm   LV e' medial:    5.54 cm/s LV PW:         0.90 cm   LV E/e' medial:  20.8 LV IVS:        0.90 cm   LV e' lateral:   7.84 cm/s LVOT  diam:     2.00 cm   LV E/e' lateral: 14.7 LV SV:         58 LV SV Index:   29  LVOT Area:     3.14 cm  RIGHT VENTRICLE             IVC RV Basal diam:  6.00 cm     IVC diam: 2.80 cm RV S prime:     10.90 cm/s TAPSE (M-mode): 1.7 cm LEFT ATRIUM             Index        RIGHT ATRIUM           Index LA diam:        4.50 cm 2.21 cm/m   RA Area:     28.70 cm LA Vol (A2C):   52.7 ml 25.89 ml/m  RA Volume:   111.00 ml 54.54 ml/m LA Vol (A4C):   60.2 ml 29.58 ml/m LA Biplane Vol: 57.2 ml 28.10 ml/m  AORTIC VALVE LVOT Vmax:   85.95 cm/s LVOT Vmean:  54.500 cm/s LVOT VTI:    0.186 m  AORTA Ao Root diam: 3.90 cm Ao Asc diam:  3.80 cm MV E velocity: 115.33 cm/s  TRICUSPID VALVE                             TR Peak grad:   27.9 mmHg                             TR Vmax:        264.00 cm/s                              SHUNTS                             Systemic VTI:  0.19 m                             Systemic Diam: 2.00 cm Evalene Lunger MD Electronically signed by Evalene Lunger MD Signature Date/Time: 09/26/2023/1:44:23 PM    Final (Updated)    US  RENAL Result Date: 09/25/2023 CLINICAL DATA:  6300 Hydronephrosis 6300 EXAM: RENAL / URINARY TRACT ULTRASOUND COMPLETE COMPARISON:  October 25, 2022 FINDINGS: Right Kidney: Renal measurements: 8.9 x 3.5 x 4.9 cm = volume: 81 mL. Normal echogenicity. Upper pole cyst measuring 1.6 cm, unchanged. No hydronephrosis or nephrolithiasis. Left Kidney: Renal measurements: 9.2 x 5.2 x 4.3 cm = volume: 180 mL. Normal echogenicity. Parapelvic cyst in the interpolar region, measuring 2.5 x 2.3 x 2.3 cm, unchanged. Lower pole calculus measuring 5 mm. No hydronephrosis. Bladder: Distended with trabeculated wall. Other: None. IMPRESSION: 1. Nonobstructive left lower pole calculus. No hydronephrosis in either kidney. 2. Fluid-filled distended urinary bladder with trabeculation of the bladder wall, possibly due to chronic bladder outlet obstruction or neurogenic bladder. Electronically Signed    By: Rogelia Myers M.D.   On: 09/25/2023 15:31   CT ABDOMEN PELVIS W CONTRAST Result Date: 09/25/2023 CLINICAL DATA:  Sepsis klebsiella bacteremia, unclear source. suspected colon, liver, kidney source. EXAM: CT ABDOMEN AND PELVIS WITH CONTRAST TECHNIQUE: Multidetector CT imaging of the abdomen and pelvis was performed using the standard protocol following bolus administration of intravenous contrast. RADIATION DOSE REDUCTION: This exam was performed according to the departmental dose-optimization program which includes automated exposure control, adjustment of the mA and/or kV according to patient size and/or use  of iterative reconstruction technique. CONTRAST:  OMNIPAQUE  IOHEXOL  300 MG/ML  SOLN COMPARISON:  CT scan chest, abdomen and pelvis from 10/25/2022. FINDINGS: Lower chest: The lung bases are clear. No pleural effusion. The heart is mildly enlarged in size. Small pericardial effusion. Hepatobiliary: The liver is normal in size. Non-cirrhotic configuration. No suspicious mass. No intrahepatic or extrahepatic bile duct dilation. No calcified gallstones. Normal gallbladder wall thickness. No pericholecystic inflammatory changes. Pancreas: Unremarkable. No pancreatic ductal dilatation or surrounding inflammatory changes. Spleen: Within normal limits. No focal lesion. Adrenals/Urinary Tract: Adrenal glands are unremarkable. No suspicious renal mass. There are several simple cysts throughout bilateral kidneys with largest in the left kidney interpolar region measuring up to 2.7 x 2.8 cm. There is a 4 x 6 mm nonobstructing calculus in the left kidney lower pole calyx. No other nephroureterolithiasis or obstructive uropathy on either side. Unremarkable urinary bladder. Stomach/Bowel: No disproportionate dilation of the small or large bowel loops. No evidence of abnormal bowel wall thickening or inflammatory changes. The appendix was not visualized; however there is no acute inflammatory process in the  right lower quadrant. There is moderate-to-large stool burden. Vascular/Lymphatic: No ascites or pneumoperitoneum. No abdominal or pelvic lymphadenopathy, by size criteria. No aneurysmal dilation of the major abdominal arteries. There are mild peripheral atherosclerotic vascular calcifications of the aorta and its major branches. Reproductive: Normal size prostate. Symmetric seminal vesicles. Other: There is tiny midline supraumbilical fat containing ventral hernia. There is mild-to-moderate anasarca. Musculoskeletal: No suspicious osseous lesions. There are mild multilevel degenerative changes in the visualized spine. IMPRESSION: 1. No acute inflammatory process identified within the abdomen or pelvis. 2. There is a 4 x 6 mm nonobstructing calculus in the left kidney lower pole calyx. No other nephroureterolithiasis or obstructive uropathy on either side. 3. Multiple other nonacute observations, as described above. Aortic Atherosclerosis (ICD10-I70.0). Electronically Signed   By: Ree Molt M.D.   On: 09/25/2023 15:27   CT Angio Chest PE W and/or Wo Contrast Result Date: 09/21/2023 CLINICAL DATA:  Pulmonary embolism (PE) suspected, high prob. Weakness. EXAM: CT ANGIOGRAPHY CHEST WITH CONTRAST TECHNIQUE: Multidetector CT imaging of the chest was performed using the standard protocol during bolus administration of intravenous contrast. Multiplanar CT image reconstructions and MIPs were obtained to evaluate the vascular anatomy. RADIATION DOSE REDUCTION: This exam was performed according to the departmental dose-optimization program which includes automated exposure control, adjustment of the mA and/or kV according to patient size and/or use of iterative reconstruction technique. CONTRAST:  75mL OMNIPAQUE  IOHEXOL  350 MG/ML SOLN COMPARISON:  10/25/2022 FINDINGS: Cardiovascular: Cardiomegaly. Small pericardial effusion. Three-vessel coronary artery disease. Aortic atherosclerosis. No evidence of aortic aneurysm.  No filling defects in the pulmonary arteries to suggest pulmonary emboli. Mediastinum/Nodes: No mediastinal, hilar, or axillary adenopathy. Trachea and esophagus are unremarkable. Thyroid unremarkable. Lungs/Pleura: Dependent atelectasis in the lung bases. Mild airway thickening in the lower lobes. No confluent opacities or effusions. Upper Abdomen: No acute findings Musculoskeletal: Chest wall soft tissues are unremarkable. No acute bony abnormality. Review of the MIP images confirms the above findings. IMPRESSION: Cardiomegaly, three-vessel coronary artery disease. Small pericardial effusion. No evidence of pulmonary embolus. Mild airway thickening in the lower lobes bilaterally. Bibasilar atelectasis. Aortic Atherosclerosis (ICD10-I70.0). Electronically Signed   By: Franky Crease M.D.   On: 09/21/2023 17:15   DG Chest Port 1 View Result Date: 09/21/2023 CLINICAL DATA:  Questionable sepsis - evaluate for abnormality EXAM: PORTABLE CHEST - 1 VIEW COMPARISON:  12/15/2022 FINDINGS: Mild alveolar opacities in both lung bases.  Heart size upper limits normal for technique. Aortic Atherosclerosis (ICD10-170.0). No effusion. Visualized bones unremarkable. IMPRESSION: Mild bibasilar alveolar opacities. Electronically Signed   By: JONETTA Faes M.D.   On: 09/21/2023 16:18    Microbiology: Results for orders placed or performed during the hospital encounter of 09/21/23  Resp panel by RT-PCR (RSV, Flu A&B, Covid) Anterior Nasal Swab     Status: None   Collection Time: 09/21/23  3:50 PM   Specimen: Anterior Nasal Swab  Result Value Ref Range Status   SARS Coronavirus 2 by RT PCR NEGATIVE NEGATIVE Final    Comment: (NOTE) SARS-CoV-2 target nucleic acids are NOT DETECTED.  The SARS-CoV-2 RNA is generally detectable in upper respiratory specimens during the acute phase of infection. The lowest concentration of SARS-CoV-2 viral copies this assay can detect is 138 copies/mL. A negative result does not preclude  SARS-Cov-2 infection and should not be used as the sole basis for treatment or other patient management decisions. A negative result may occur with  improper specimen collection/handling, submission of specimen other than nasopharyngeal swab, presence of viral mutation(s) within the areas targeted by this assay, and inadequate number of viral copies(<138 copies/mL). A negative result must be combined with clinical observations, patient history, and epidemiological information. The expected result is Negative.  Fact Sheet for Patients:  BloggerCourse.com  Fact Sheet for Healthcare Providers:  SeriousBroker.it  This test is no t yet approved or cleared by the [place]  FDA and  has been authorized for detection and/or diagnosis of SARS-CoV-2 by FDA under an Emergency Use Authorization (EUA). This EUA will remain  in effect (meaning this test can be used) for the duration of the COVID-19 declaration under Section 564(b)(1) of the Act, 21 U.S.C.section 360bbb-3(b)(1), unless the authorization is terminated  or revoked sooner.       Influenza A by PCR NEGATIVE NEGATIVE Final   Influenza B by PCR NEGATIVE NEGATIVE Final    Comment: (NOTE) The Xpert Xpress SARS-CoV-2/FLU/RSV plus assay is intended as an aid in the diagnosis of influenza from Nasopharyngeal swab specimens and should not be used as a sole basis for treatment. Nasal washings and aspirates are unacceptable for Xpert Xpress SARS-CoV-2/FLU/RSV testing.  Fact Sheet for Patients: BloggerCourse.com  Fact Sheet for Healthcare Providers: SeriousBroker.it  This test is not yet approved or cleared by the [place]  FDA and has been authorized for detection and/or diagnosis of SARS-CoV-2 by FDA under an Emergency Use Authorization (EUA). This EUA will remain in effect (meaning this test can be used) for the duration of  the COVID-19 declaration under Section 564(b)(1) of the Act, 21 U.S.C. section 360bbb-3(b)(1), unless the authorization is terminated or revoked.     Resp Syncytial Virus by PCR NEGATIVE NEGATIVE Final    Comment: (NOTE) Fact Sheet for Patients: BloggerCourse.com  Fact Sheet for Healthcare Providers: SeriousBroker.it  This test is not yet approved or cleared by the [place]  FDA and has been authorized for detection and/or diagnosis of SARS-CoV-2 by FDA under an Emergency Use Authorization (EUA). This EUA will remain in effect (meaning this test can be used) for the duration of the COVID-19 declaration under Section 564(b)(1) of the Act, 21 U.S.C. section 360bbb-3(b)(1), unless the authorization is terminated or revoked.  Performed at [facility], [address] [place], [place] [address]   Blood Culture (routine x 2)     Status: Abnormal   Collection Time: 09/21/23  3:50 PM   Specimen: BLOOD RIGHT HAND  Result Value Ref  Range Status   Specimen Description   Final    BLOOD RIGHT HAND Performed at [facility], [address] [place], [place] [address]    Special Requests   Final    BOTTLES DRAWN AEROBIC AND ANAEROBIC Blood Culture adequate volume Performed at [facility], [address] [place], [place] [address]    Culture  Setup Time   Final    IN BOTH AEROBIC AND ANAEROBIC BOTTLES GRAM NEGATIVE RODS CRITICAL RESULT CALLED TO, READ BACK BY AND VERIFIED WITH: NATHAN BELUE, PHARMACY 07//27/2025 0326 JL    Culture KLEBSIELLA PNEUMONIAE (A)  Final   Report Status 09/24/2023 FINAL  Final   Organism ID, Bacteria KLEBSIELLA PNEUMONIAE  Final   Organism ID, Bacteria KLEBSIELLA PNEUMONIAE  Final      Susceptibility   Klebsiella pneumoniae - KIRBY BAUER*    CEFAZOLIN SENSITIVE Sensitive    Klebsiella pneumoniae - MIC*    AMPICILLIN RESISTANT Resistant     CEFEPIME  <=0.12 SENSITIVE  Sensitive     CEFTAZIDIME <=1 SENSITIVE Sensitive     CEFTRIAXONE  <=0.25 SENSITIVE Sensitive     CIPROFLOXACIN <=0.25 SENSITIVE Sensitive     GENTAMICIN <=1 SENSITIVE Sensitive     IMIPENEM <=0.25 SENSITIVE Sensitive     TRIMETH/SULFA <=20 SENSITIVE Sensitive     AMPICILLIN/SULBACTAM 4 SENSITIVE Sensitive     PIP/TAZO <=4 SENSITIVE Sensitive ug/mL    * KLEBSIELLA PNEUMONIAE    KLEBSIELLA PNEUMONIAE  Blood Culture (routine x 2)     Status: Abnormal   Collection Time: 09/21/23  3:50 PM   Specimen: BLOOD  Result Value Ref Range Status   Specimen Description   Final    BLOOD LEFT ANTECUBITAL Performed at [facility], [address] [place], [place] [address]    Special Requests   Final    BOTTLES DRAWN AEROBIC AND ANAEROBIC Blood Culture adequate volume Performed at [facility], [address] [place], [place] [address]    Culture  Setup Time   Final    GRAM NEGATIVE RODS IN BOTH AEROBIC AND ANAEROBIC BOTTLES CRITICAL VALUE NOTED.  VALUE IS CONSISTENT WITH PREVIOUSLY REPORTED AND CALLED VALUE.    Culture (A)  Final    KLEBSIELLA PNEUMONIAE SUSCEPTIBILITIES PERFORMED ON PREVIOUS CULTURE WITHIN THE LAST 5 DAYS. Performed at [facility], [address] [place], [place] [address]    Report Status 09/24/2023 FINAL  Final  Blood Culture ID Panel (Reflexed)     Status: Abnormal   Collection Time: 09/21/23  3:50 PM  Result Value Ref Range Status   Enterococcus faecalis NOT DETECTED NOT DETECTED Final   Enterococcus Faecium NOT DETECTED NOT DETECTED Final   Listeria monocytogenes NOT DETECTED NOT DETECTED Final   Staphylococcus species NOT DETECTED NOT DETECTED Final   Staphylococcus aureus (BCID) NOT DETECTED NOT DETECTED Final   Staphylococcus epidermidis NOT DETECTED NOT DETECTED Final   Staphylococcus lugdunensis NOT DETECTED NOT DETECTED Final   Streptococcus species NOT DETECTED NOT DETECTED Final   Streptococcus agalactiae NOT DETECTED NOT  DETECTED Final   Streptococcus pneumoniae NOT DETECTED NOT DETECTED Final   Streptococcus pyogenes NOT DETECTED NOT DETECTED Final   A.calcoaceticus-baumannii NOT DETECTED NOT DETECTED Final   Bacteroides fragilis NOT DETECTED NOT DETECTED Final   Enterobacterales DETECTED (A) NOT DETECTED Final    Comment: CRITICAL RESULT CALLED TO, READ BACK BY AND VERIFIED WITH: NATHAN BELUE, PHARMACY 07//27/2025 JL    Enterobacter cloacae complex NOT DETECTED NOT DETECTED Final   Escherichia  coli NOT DETECTED NOT DETECTED Final   Klebsiella aerogenes NOT DETECTED NOT DETECTED Final   Klebsiella oxytoca NOT DETECTED NOT DETECTED Final   Klebsiella pneumoniae DETECTED (A) NOT DETECTED Final    Comment: CRITICAL RESULT CALLED TO, READ BACK BY AND VERIFIED WITH: NATHAN BELUE, PHARMACY 07//27/2025 JL    Proteus species NOT DETECTED NOT DETECTED Final   Salmonella species NOT DETECTED NOT DETECTED Final   Serratia marcescens NOT DETECTED NOT DETECTED Final   Haemophilus influenzae NOT DETECTED NOT DETECTED Final   Neisseria meningitidis NOT DETECTED NOT DETECTED Final   Pseudomonas aeruginosa NOT DETECTED NOT DETECTED Final   Stenotrophomonas maltophilia NOT DETECTED NOT DETECTED Final   Candida albicans NOT DETECTED NOT DETECTED Final   Candida auris NOT DETECTED NOT DETECTED Final   Candida glabrata NOT DETECTED NOT DETECTED Final   Candida krusei NOT DETECTED NOT DETECTED Final   Candida parapsilosis NOT DETECTED NOT DETECTED Final   Candida tropicalis NOT DETECTED NOT DETECTED Final   Cryptococcus neoformans/gattii NOT DETECTED NOT DETECTED Final   CTX-M ESBL NOT DETECTED NOT DETECTED Final   Carbapenem resistance IMP NOT DETECTED NOT DETECTED Final   Carbapenem resistance KPC NOT DETECTED NOT DETECTED Final   Carbapenem resistance NDM NOT DETECTED NOT DETECTED Final   Carbapenem resist OXA 48 LIKE NOT DETECTED NOT DETECTED Final   Carbapenem resistance VIM NOT DETECTED NOT DETECTED Final     Comment: Performed at [facility], [address] [place], [place] [address]  Culture, blood (Routine X 2) w Reflex to ID Panel     Status: None (Preliminary result)   Collection Time: 09/23/23  7:24 PM   Specimen: BLOOD  Result Value Ref Range Status   Specimen Description BLOOD RIGHT ANTECUBITAL  Final   Special Requests   Final    BOTTLES DRAWN AEROBIC AND ANAEROBIC Blood Culture adequate volume   Culture   Final    NO GROWTH 3 DAYS Performed at [facility], [address] [place], [place] [address]    Report Status PENDING  Incomplete  Culture, blood (Routine X 2) w Reflex to ID Panel     Status: None (Preliminary result)   Collection Time: 09/23/23  7:24 PM   Specimen: BLOOD  Result Value Ref Range Status   Specimen Description BLOOD BLOOD RIGHT HAND  Final   Special Requests   Final    BOTTLES DRAWN AEROBIC AND ANAEROBIC Blood Culture adequate volume   Culture   Final    NO GROWTH 3 DAYS Performed at [facility], [address] [place], [place] [address]    Report Status PENDING  Incomplete    Labs: CBC: Recent Labs  Lab 09/21/23 1550 09/22/23 0603 09/23/23 0932 09/24/23 0630 09/25/23 0436  WBC 14.4* 29.1* 19.3* 11.4* 8.4  NEUTROABS 13.3*  --   --   --   --   HGB 11.0* 11.2* 11.0* 10.9* 11.4*  HCT 33.5* 34.8* 33.2* 32.4* 34.7*  MCV 92.0 92.3 91.7 90.3 90.8  PLT 242 234 224 237 259   Basic Metabolic Panel: Recent Labs  Lab 09/21/23 1550 09/22/23 0603 09/23/23 0932 09/24/23 0630 09/25/23 0436  NA 141 137 140 141 140  K 4.0 4.4 3.6 3.5 3.5  CL 107 107 107 108 105  CO2 26 23 24 25 29   GLUCOSE 115* 207* 117* 86 111*  BUN 17 20 24* 26* 31*  CREATININE 1.16 1.05 1.02 1.02 0.87  CALCIUM  8.0* 7.7* 7.8* 7.9* 8.2*  MG  1.7  --   --   --   --    Liver Function Tests: Recent Labs  Lab 09/21/23 1550  AST 24  ALT 12  ALKPHOS 70  BILITOT 1.0  PROT 4.3*  ALBUMIN  1.8*   CBG: Recent Labs  Lab 09/25/23 1306 09/25/23 1743  09/25/23 2116 09/26/23 0827 09/26/23 1204  GLUCAP 108* 142* 127* 107* 148*    Discharge time spent: greater than 30 minutes.  Signed: AIDA CHO, MD [facility] 09/26/2023

## 2023-09-28 LAB — CULTURE, BLOOD (ROUTINE X 2)
Culture: NO GROWTH
Culture: NO GROWTH
Special Requests: ADEQUATE
Special Requests: ADEQUATE

## 2024-01-27 ENCOUNTER — Other Ambulatory Visit (HOSPITAL_COMMUNITY): Payer: Self-pay
